# Patient Record
Sex: Male | Born: 1950
Health system: Southern US, Community
[De-identification: ages and names within clinical notes are randomized; demographics above are authoritative.]

## PROBLEM LIST (undated history)

## (undated) DIAGNOSIS — M1990 Unspecified osteoarthritis, unspecified site: Secondary | ICD-10-CM

## (undated) DIAGNOSIS — G4730 Sleep apnea, unspecified: Secondary | ICD-10-CM

## (undated) DIAGNOSIS — I255 Ischemic cardiomyopathy: Secondary | ICD-10-CM

## (undated) DIAGNOSIS — I10 Essential (primary) hypertension: Secondary | ICD-10-CM

## (undated) DIAGNOSIS — E119 Type 2 diabetes mellitus without complications: Secondary | ICD-10-CM

## (undated) DIAGNOSIS — Z87442 Personal history of urinary calculi: Secondary | ICD-10-CM

## (undated) DIAGNOSIS — I219 Acute myocardial infarction, unspecified: Secondary | ICD-10-CM

## (undated) DIAGNOSIS — I2510 Atherosclerotic heart disease of native coronary artery without angina pectoris: Secondary | ICD-10-CM

## (undated) DIAGNOSIS — J189 Pneumonia, unspecified organism: Secondary | ICD-10-CM

## (undated) DIAGNOSIS — I509 Heart failure, unspecified: Secondary | ICD-10-CM

## (undated) DIAGNOSIS — Z95810 Presence of automatic (implantable) cardiac defibrillator: Secondary | ICD-10-CM

## (undated) HISTORY — PX: OTHER SURGICAL HISTORY: SHX169

## (undated) HISTORY — PX: HERNIA REPAIR: SHX51

## (undated) HISTORY — DX: Essential (primary) hypertension: I10

## (undated) HISTORY — DX: Ischemic cardiomyopathy: I25.5

## (undated) HISTORY — DX: Type 2 diabetes mellitus without complications: E11.9

---
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
## 1974-10-18 HISTORY — PX: HERNIA REPAIR: SHX51

## 1982-10-18 HISTORY — PX: NASAL SEPTUM SURGERY: SHX37

## 1984-10-18 HISTORY — PX: VASECTOMY: SHX75

## 1985-10-18 HISTORY — PX: VASECTOMY: SHX75

## 1991-10-19 HISTORY — PX: CYSTOSCOPY W/ STONE MANIPULATION: SHX1427

## 2000-10-18 DIAGNOSIS — I10 Essential (primary) hypertension: Secondary | ICD-10-CM

## 2000-10-18 DIAGNOSIS — E119 Type 2 diabetes mellitus without complications: Secondary | ICD-10-CM

## 2000-10-18 HISTORY — DX: Essential (primary) hypertension: I10

## 2000-10-18 HISTORY — DX: Type 2 diabetes mellitus without complications: E11.9

## 2001-03-10 ENCOUNTER — Ambulatory Visit (HOSPITAL_COMMUNITY): Admission: RE | Admit: 2001-03-10 | Discharge: 2001-03-10 | Payer: Self-pay | Admitting: Gastroenterology

## 2001-09-12 ENCOUNTER — Encounter: Payer: Self-pay | Admitting: Family Medicine

## 2001-09-12 ENCOUNTER — Ambulatory Visit (HOSPITAL_COMMUNITY): Admission: RE | Admit: 2001-09-12 | Discharge: 2001-09-12 | Payer: Self-pay | Admitting: Family Medicine

## 2010-10-18 HISTORY — PX: JOINT REPLACEMENT: SHX530

## 2015-10-01 ENCOUNTER — Encounter: Payer: Self-pay | Admitting: Family Medicine

## 2015-10-01 DIAGNOSIS — I10 Essential (primary) hypertension: Secondary | ICD-10-CM | POA: Insufficient documentation

## 2015-10-01 DIAGNOSIS — E119 Type 2 diabetes mellitus without complications: Secondary | ICD-10-CM | POA: Insufficient documentation

## 2015-10-27 ENCOUNTER — Encounter: Payer: Self-pay | Admitting: Physician Assistant

## 2015-10-27 ENCOUNTER — Ambulatory Visit (INDEPENDENT_AMBULATORY_CARE_PROVIDER_SITE_OTHER): Admitting: Physician Assistant

## 2015-10-27 VITALS — BP 144/66 | HR 76 | Temp 98.1°F | Resp 18 | Ht 76.0 in | Wt 283.0 lb

## 2015-10-27 DIAGNOSIS — E291 Testicular hypofunction: Secondary | ICD-10-CM

## 2015-10-27 DIAGNOSIS — Z7189 Other specified counseling: Secondary | ICD-10-CM | POA: Diagnosis not present

## 2015-10-27 DIAGNOSIS — I10 Essential (primary) hypertension: Secondary | ICD-10-CM | POA: Diagnosis not present

## 2015-10-27 DIAGNOSIS — Z7689 Persons encountering health services in other specified circumstances: Secondary | ICD-10-CM

## 2015-10-27 DIAGNOSIS — N529 Male erectile dysfunction, unspecified: Secondary | ICD-10-CM | POA: Insufficient documentation

## 2015-10-27 DIAGNOSIS — E782 Mixed hyperlipidemia: Secondary | ICD-10-CM | POA: Insufficient documentation

## 2015-10-27 DIAGNOSIS — E119 Type 2 diabetes mellitus without complications: Secondary | ICD-10-CM | POA: Diagnosis not present

## 2015-10-27 DIAGNOSIS — E785 Hyperlipidemia, unspecified: Secondary | ICD-10-CM

## 2015-10-27 DIAGNOSIS — E349 Endocrine disorder, unspecified: Secondary | ICD-10-CM

## 2015-10-27 LAB — CBC WITH DIFFERENTIAL/PLATELET
Basophils Absolute: 0.1 10*3/uL (ref 0.0–0.1)
Basophils Relative: 1 % (ref 0–1)
Eosinophils Absolute: 0.3 10*3/uL (ref 0.0–0.7)
Eosinophils Relative: 4 % (ref 0–5)
HEMATOCRIT: 40.4 % (ref 39.0–52.0)
HEMOGLOBIN: 13.5 g/dL (ref 13.0–17.0)
LYMPHS ABS: 2.2 10*3/uL (ref 0.7–4.0)
Lymphocytes Relative: 28 % (ref 12–46)
MCH: 29.7 pg (ref 26.0–34.0)
MCHC: 33.4 g/dL (ref 30.0–36.0)
MCV: 89 fL (ref 78.0–100.0)
MONOS PCT: 8 % (ref 3–12)
MPV: 10.8 fL (ref 8.6–12.4)
Monocytes Absolute: 0.6 10*3/uL (ref 0.1–1.0)
NEUTROS ABS: 4.6 10*3/uL (ref 1.7–7.7)
NEUTROS PCT: 59 % (ref 43–77)
Platelets: 180 10*3/uL (ref 150–400)
RBC: 4.54 MIL/uL (ref 4.22–5.81)
RDW: 13.5 % (ref 11.5–15.5)
WBC: 7.8 10*3/uL (ref 4.0–10.5)

## 2015-10-27 LAB — HEMOGLOBIN A1C
Hgb A1c MFr Bld: 7.6 % — ABNORMAL HIGH (ref <5.7)
Mean Plasma Glucose: 171 mg/dL — ABNORMAL HIGH (ref <117)

## 2015-10-27 LAB — COMPLETE METABOLIC PANEL WITH GFR
ALBUMIN: 4.2 g/dL (ref 3.6–5.1)
ALT: 23 U/L (ref 9–46)
AST: 18 U/L (ref 10–35)
Alkaline Phosphatase: 55 U/L (ref 40–115)
BILIRUBIN TOTAL: 0.5 mg/dL (ref 0.2–1.2)
BUN: 20 mg/dL (ref 7–25)
CALCIUM: 9.8 mg/dL (ref 8.6–10.3)
CO2: 28 mmol/L (ref 20–31)
Chloride: 100 mmol/L (ref 98–110)
Creat: 1.09 mg/dL (ref 0.70–1.25)
GFR, EST NON AFRICAN AMERICAN: 71 mL/min (ref >=60)
GFR, Est African American: 82 mL/min (ref >=60)
GLUCOSE: 331 mg/dL — AB (ref 70–99)
Potassium: 4.6 mmol/L (ref 3.5–5.3)
SODIUM: 136 mmol/L (ref 135–146)
Total Protein: 6.7 g/dL (ref 6.1–8.1)

## 2015-10-27 MED ORDER — LISINOPRIL 20 MG PO TABS: 10.0000 mg | ORAL_TABLET | Freq: Every day | ORAL | Status: DC

## 2015-10-27 MED ORDER — METFORMIN HCL 1000 MG PO TABS: 1000.0000 mg | ORAL_TABLET | Freq: Two times a day (BID) | ORAL | Status: DC

## 2015-10-27 MED ORDER — ATORVASTATIN CALCIUM 80 MG PO TABS: 40.0000 mg | ORAL_TABLET | Freq: Every day | ORAL | Status: DC

## 2015-10-27 MED ORDER — GLYBURIDE 5 MG PO TABS: 10.0000 mg | ORAL_TABLET | Freq: Two times a day (BID) | ORAL | Status: DC

## 2015-10-27 MED ORDER — PIOGLITAZONE HCL 45 MG PO TABS
45.0000 mg | ORAL_TABLET | Freq: Every day | ORAL | Status: DC
Start: 2015-10-27 — End: 2015-11-24

## 2015-10-27 NOTE — Progress Notes (Signed)
Patient ID: Keith Schwartz MRN: [id number], DOB: 09-23-1951, 65 y.o. Date of Encounter: @DATE @  Chief Complaint:  Chief Complaint  Patient presents with  . new pt est care    HPI: 65 y.o. year old white male  Presents as a new patient to establish care.  His mother recently became a patient here and I have seen him here with her for her visits. Also his wife is being seen as a new patient to establish care here today.  Says that they had lived in this area about 10 years ago and that his wife is from this area. However they had then moved to [place] and just recently moved back to this area in September.  He says that he usually gets his medical care and medications through the [place]. Says that [facility] yet.  Says that once he gets established with the [place], he will see them for his chronic medical problems and come here just for acute illnesses.  He has no complaints or concerns today.  Says that he was diagnosed with diabetes and started medicines for diabetes at age 65.  Says that he does not need a refill on either Levitra or Viagra. Says that he has lots of them at home that have been un- used.  Says that he also been diagnosed with low testosterone and the [facility] was [facility]. His last injection was around November. He is not fasting today.   Past Medical History  Diagnosis Date  . Diabetes mellitus without complication (HCC) 2002  . Hypertension 2002     Home Meds: Outpatient Prescriptions Prior to Visit  Medication Sig Dispense Refill  . aspirin 325 MG tablet Take 325 mg by mouth daily.    . Multiple Vitamin (MULTIVITAMIN) tablet Take 1 tablet by mouth daily. MEN'S ONE A DAY 50+    . vitamin E 400 UNIT capsule Take 400 Units by mouth daily.    Marland Kitchen atorvastatin (LIPITOR) 80 MG tablet Take 40 mg by mouth daily.    Marland Kitchen glyBURIDE (DIABETA) 5 MG tablet Take 10  mg by mouth 2 (two) times daily with a meal.    . lisinopril (PRINIVIL,ZESTRIL) 20 MG tablet Take 10 mg by mouth daily.    . metFORMIN (GLUCOPHAGE) 1000 MG tablet Take 1,000 mg by mouth 2 (two) times daily with a meal.    . pioglitazone (ACTOS) 45 MG tablet Take 45 mg by mouth daily.    . hydrOXYzine (ATARAX/VISTARIL) 25 MG tablet Take 25 mg by mouth 3 (three) times daily.    . sildenafil (VIAGRA) 100 MG tablet Take 50 mg by mouth daily as needed for erectile dysfunction. Reported on 10/27/2015    . vardenafil (LEVITRA) 20 MG tablet Take 20 mg by mouth. Reported on 10/27/2015     No facility-administered medications prior to visit.    Allergies:  Allergies  Allergen Reactions  . Penicillins     Social History   Social History  . Marital Status: Married    Spouse Name: N/A  . Number of Children: N/A  . Years of Education: N/A   Occupational History  . Not on file.   Social History Main Topics  . Smoking status: Never Smoker   . Smokeless tobacco: Never Used  . Alcohol Use: 0.6 oz/week    1 Cans of beer per week  . Drug Use:  Not on file  . Sexual Activity: Yes    Birth Control/ Protection: Surgical   Other Topics Concern  . Not on file   Social History Narrative    Family History  Problem Relation Age of Onset  . Arthritis Mother   . Asthma Mother   . Hearing loss Mother   . Vision loss Mother   . Heart disease Father   . Hyperlipidemia Father   . Hypertension Father   . Early death Sister      Review of Systems:  See HPI for pertinent ROS. All other ROS negative.    Physical Exam: Blood pressure 144/66, pulse 76, temperature 98.1 F (36.7 C), temperature source Oral, resp. rate 18, height 6\' 4"  (1.93 m), weight 283 lb (128.368 kg)., Body mass index is 34.46 kg/(m^2). General: WNWD WM. Appears in no acute distress. Neck: Supple. No thyromegaly. No lymphadenopathy. No carotid bruits. Lungs: Clear bilaterally to auscultation without wheezes, rales, or rhonchi.  Breathing is unlabored. Heart: RRR with S1 S2. No murmurs, rubs, or gallops. Abdomen: Soft, non-tender, non-distended with normoactive bowel sounds. No hepatomegaly. No rebound/guarding. No obvious abdominal masses. Musculoskeletal:  Strength and tone normal for age. Extremities/Skin: Warm and dry. He has on boots and does not want to remove them. Says that he can see his feet and nose that there are no wounds and says he has good sensation of his feet. Neuro: Alert and oriented X 3. Moves all extremities spontaneously. Gait is normal. CNII-XII grossly in tact. Psych:  Responds to questions appropriately with a normal affect.     ASSESSMENT AND PLAN:  65 y.o. year old male with  1. Encounter to establish care with new doctor  2. Diabetes mellitus without complication (HCC) Will continue current medications. Check labs to monitor. Says that usually he sees an eye doctor through the [place]. Says that he is in the process of getting established [facility] and then will schedule to see an eye doctor with them. - COMPLETE METABOLIC PANEL WITH GFR - Hemoglobin A1c - glyBURIDE (DIABETA) 5 MG tablet; Take 2 tablets (10 mg total) by mouth 2 (two) times daily with a meal.  Dispense: 360 tablet; Refill: 0 - metFORMIN (GLUCOPHAGE) 1000 MG tablet; Take 1 tablet (1,000 mg total) by mouth 2 (two) times daily with a meal.  Dispense: 180 tablet; Refill: 0 - pioglitazone (ACTOS) 45 MG tablet; Take 1 tablet (45 mg total) by mouth daily.  Dispense: 90 tablet; Refill: 0  3. Hyperlipemia Refill medication. Check lab to monitor. He is not fasting so cannot check lipid panel. - COMPLETE METABOLIC PANEL WITH GFR - atorvastatin (LIPITOR) 80 MG tablet; Take 0.5 tablets (40 mg total) by mouth daily.  Dispense: 90 tablet; Refill: 0  4. Essential hypertension He says that his blood pressure is usually much lower usually in the 120s. Continue current medication. Check lab to monitor. - COMPLETE METABOLIC PANEL  WITH GFR - lisinopril (PRINIVIL,ZESTRIL) 20 MG tablet; Take 0.5 tablets (10 mg total) by mouth daily.  Dispense: 90 tablet; Refill: 0  5. Erectile dysfunction, unspecified erectile dysfunction type Says that he does not need refill on medication at this time.  6. Testosterone deficiency Says that the [place] had diagnosed him with low testosterone and had him on testosterone injections. Is that last injection was in November. We'll check testosterone level and other labs now. If indicated once we get lab results, then we'll have him return for testosterone injection. - Testosterone - PSA -  CBC with Differential/Platelet   Signed, Shon HaleMary Beth Plumas [place], [place], [facility] 10/27/2015 11:35 AM

## 2015-10-28 LAB — TESTOSTERONE: Testosterone: 258 ng/dL (ref 250–827)

## 2015-10-28 LAB — PSA: PSA: 0.56 ng/mL (ref <=4.00)

## 2015-10-29 ENCOUNTER — Other Ambulatory Visit: Payer: Self-pay | Admitting: Family Medicine

## 2015-10-29 DIAGNOSIS — E1165 Type 2 diabetes mellitus with hyperglycemia: Principal | ICD-10-CM

## 2015-10-29 DIAGNOSIS — IMO0001 Reserved for inherently not codable concepts without codable children: Secondary | ICD-10-CM

## 2015-10-29 MED ORDER — SITAGLIPTIN PHOSPHATE 100 MG PO TABS: 100.0000 mg | ORAL_TABLET | Freq: Every day | ORAL | Status: DC

## 2015-11-03 ENCOUNTER — Encounter: Payer: Self-pay | Admitting: Family Medicine

## 2015-11-24 ENCOUNTER — Telehealth: Payer: Self-pay | Admitting: Family Medicine

## 2015-11-24 ENCOUNTER — Other Ambulatory Visit: Payer: Self-pay | Admitting: Family Medicine

## 2015-11-24 DIAGNOSIS — E785 Hyperlipidemia, unspecified: Secondary | ICD-10-CM

## 2015-11-24 DIAGNOSIS — E119 Type 2 diabetes mellitus without complications: Secondary | ICD-10-CM

## 2015-11-24 DIAGNOSIS — IMO0001 Reserved for inherently not codable concepts without codable children: Secondary | ICD-10-CM

## 2015-11-24 DIAGNOSIS — I10 Essential (primary) hypertension: Secondary | ICD-10-CM

## 2015-11-24 DIAGNOSIS — E1165 Type 2 diabetes mellitus with hyperglycemia: Secondary | ICD-10-CM

## 2015-11-24 MED ORDER — METFORMIN HCL 1000 MG PO TABS: 1000.0000 mg | ORAL_TABLET | Freq: Two times a day (BID) | ORAL | Status: DC

## 2015-11-24 MED ORDER — GLYBURIDE 5 MG PO TABS: 10.0000 mg | ORAL_TABLET | Freq: Two times a day (BID) | ORAL | Status: DC

## 2015-11-24 MED ORDER — HYDROXYZINE HCL 25 MG PO TABS: 25.0000 mg | ORAL_TABLET | Freq: Three times a day (TID) | ORAL | Status: DC | PRN

## 2015-11-24 MED ORDER — ATORVASTATIN CALCIUM 80 MG PO TABS: 40.0000 mg | ORAL_TABLET | Freq: Every day | ORAL | Status: DC

## 2015-11-24 MED ORDER — PIOGLITAZONE HCL 45 MG PO TABS: 45.0000 mg | ORAL_TABLET | Freq: Every day | ORAL | Status: DC

## 2015-11-24 MED ORDER — LISINOPRIL 20 MG PO TABS: 10.0000 mg | ORAL_TABLET | Freq: Every day | ORAL | Status: DC

## 2015-11-24 MED ORDER — SITAGLIPTIN PHOSPHATE 100 MG PO TABS: 100.0000 mg | ORAL_TABLET | Freq: Every day | ORAL | Status: DC

## 2015-11-24 NOTE — Telephone Encounter (Signed)
Rec'd letter form [facility] concerning step therapy for pt's Actos.  Wants other alternatives to be considered (metformin, glimepiride, glipizide, glyburide, glyburide micronized).  Pt is already taking Glyburide, Metformnin and Jnauvia.  Letter and pt's current med list faxed back to [facility] for their review.

## 2015-11-24 NOTE — Telephone Encounter (Signed)
Medication refilled per protocol. 

## 2015-11-24 NOTE — Telephone Encounter (Signed)
Approved.  

## 2015-11-24 NOTE — Telephone Encounter (Signed)
Has 1 mth visit this week, 3 mth visit in April.  OK for Hydroxyzine (prn) to mail order?

## 2015-11-27 ENCOUNTER — Ambulatory Visit: Payer: Self-pay | Admitting: Physician Assistant

## 2016-01-26 ENCOUNTER — Ambulatory Visit (INDEPENDENT_AMBULATORY_CARE_PROVIDER_SITE_OTHER): Payer: Medicare Other | Admitting: Physician Assistant

## 2016-01-26 ENCOUNTER — Encounter: Payer: Self-pay | Admitting: Physician Assistant

## 2016-01-26 VITALS — BP 126/64 | HR 68 | Temp 98.0°F | Resp 18 | Wt 278.0 lb

## 2016-01-26 DIAGNOSIS — N529 Male erectile dysfunction, unspecified: Secondary | ICD-10-CM

## 2016-01-26 DIAGNOSIS — E119 Type 2 diabetes mellitus without complications: Secondary | ICD-10-CM

## 2016-01-26 DIAGNOSIS — E291 Testicular hypofunction: Secondary | ICD-10-CM | POA: Diagnosis not present

## 2016-01-26 DIAGNOSIS — Z1159 Encounter for screening for other viral diseases: Secondary | ICD-10-CM

## 2016-01-26 DIAGNOSIS — I10 Essential (primary) hypertension: Secondary | ICD-10-CM | POA: Diagnosis not present

## 2016-01-26 DIAGNOSIS — E349 Endocrine disorder, unspecified: Secondary | ICD-10-CM

## 2016-01-26 DIAGNOSIS — E785 Hyperlipidemia, unspecified: Secondary | ICD-10-CM

## 2016-01-26 NOTE — Progress Notes (Signed)
Patient ID: [name] MRN: [id number], DOB: [birth year], 65 y.o. Date of Encounter: @DATE @  Chief Complaint:  Chief Complaint  Patient presents with  . routine follow up    question about Hep C and Zosatvax, said Januvia caused shortness of breath    HPI: 65 y.o. year old white male  Presents for routine f/u OV.  10/27/2015 OV: Presents as a new patient to establish care.  His mother recently became a patient here and I have seen him here with her for her visits. Also his wife is being seen as a new patient to establish care here today.  Says that they had lived in this area about 10 years ago and that his wife is from this area. However [place] and just recently moved back to this area in September.  He says that he [facility]. Says that he is in the process of trying to get established with the [facility] but has not heard back from them yet.  Says that once he gets [facility], he will see them for his chronic medical problems and come here just for acute illnesses.  He has no complaints or concerns today.  Says that he was diagnosed with diabetes and started medicines for diabetes at age 65.  Says that he does not need a refill on either Levitra or Viagra. Says that he has lots of them at home that have been un- used.  Says that he also been diagnosed with low testosterone and the [facility] was giving him testosterone [facility]. His last injection was around November. He is not fasting today.  01/26/2016: He is requesting a Hepatitis C screening. Today he reports that the Januvia "made it hard to breathe --and that's one of the side effects of it ". Says that he quit the Januvia. States that his breathing has been back to normal baseline and he is having no increased shortness of breath since.  He states that [facility] [place] Says he went there about 3 or 4 weeks  ago. They did a diabetic eye exam. Says that they will manage his diabetes. Says A1c was done there was 7.1.   Past Medical History  Diagnosis Date  . Diabetes mellitus without complication (HCC) 2002  . Hypertension 2002     Home Meds: Outpatient Prescriptions Prior to Visit  Medication Sig Dispense Refill  . aspirin 325 MG tablet Take 325 mg by mouth daily.    Marland Kitchen. atorvastatin (LIPITOR) 80 MG tablet Take 0.5 tablets (40 mg total) by mouth daily. 90 tablet 1  . glyBURIDE (DIABETA) 5 MG tablet Take 2 tablets (10 mg total) by mouth 2 (two) times daily with a meal. 360 tablet 1  . hydrOXYzine (ATARAX/VISTARIL) 25 MG tablet Take 1 tablet (25 mg total) by mouth 3 (three) times daily as needed. 90 tablet 1  . lisinopril (PRINIVIL,ZESTRIL) 20 MG tablet Take 0.5 tablets (10 mg total) by mouth daily. 90 tablet 1  . metFORMIN (GLUCOPHAGE) 1000 MG tablet Take 1 tablet (1,000 mg total) by mouth 2 (two) times daily with a meal. 180 tablet 1  . Multiple Vitamin (MULTIVITAMIN) tablet Take 1 tablet by mouth daily. MEN'S ONE A DAY 50+    . pioglitazone (ACTOS) 45 MG tablet Take 1 tablet (45 mg total) by mouth daily. 90 tablet 1  . vitamin E 400 UNIT capsule Take 400  Units by mouth daily.    . sildenafil (VIAGRA) 100 MG tablet Take 50 mg by mouth daily as needed for erectile dysfunction. Reported on 10/27/2015    . sitaGLIPtin (JANUVIA) 100 MG tablet Take 1 tablet (100 mg total) by mouth daily. (Patient not taking: Reported on 01/26/2016) 90 tablet 1  . vardenafil (LEVITRA) 20 MG tablet Take 20 mg by mouth. Reported on 10/27/2015     No facility-administered medications prior to visit.    Allergies:  Allergies  Allergen Reactions  . Penicillins     Social History   Social History  . Marital Status: Married    Spouse Name: N/A  . Number of Children: N/A  . Years of Education: N/A   Occupational History  . Not on file.   Social History Main Topics  . Smoking status: Never Smoker   . Smokeless  tobacco: Never Used  . Alcohol Use: 0.6 oz/week    1 Cans of beer per week  . Drug Use: Not on file  . Sexual Activity: Yes    Birth Control/ Protection: Surgical   Other Topics Concern  . Not on file   Social History Narrative    Family History  Problem Relation Age of Onset  . Arthritis Mother   . Asthma Mother   . Hearing loss Mother   . Vision loss Mother   . Heart disease Father   . Hyperlipidemia Father   . Hypertension Father   . Early death Sister      Review of Systems:  See HPI for pertinent ROS. All other ROS negative.    Physical Exam: Blood pressure 126/64, pulse 68, temperature 98 F (36.7 C), temperature source Oral, resp. rate 18, weight 278 lb (126.1 kg)., Body mass index is 33.85 kg/(m^2). General: WNWD WM. Appears in no acute distress. Neck: Supple. No thyromegaly. No lymphadenopathy. No carotid bruits. Lungs: Clear bilaterally to auscultation without wheezes, rales, or rhonchi. Breathing is unlabored. Heart: RRR with S1 S2. No murmurs, rubs, or gallops. Abdomen: Soft, non-tender, non-distended with normoactive bowel sounds. No hepatomegaly. No rebound/guarding. No obvious abdominal masses. Musculoskeletal:  Strength and tone normal for age. Extremities/Skin: Warm and dry. He has on boots and does not want to remove them. Says that he can see his feet and nose that there are no wounds and says he has good sensation of his feet. Neuro: Alert and oriented X 3. Moves all extremities spontaneously. Gait is normal. CNII-XII grossly in tact. Psych:  Responds to questions appropriately with a normal affect.     ASSESSMENT AND PLAN:  64 y.o. year old male with   1. Diabetes mellitus without complication (HCC) This is now being managed by the [place].  2. Essential hypertension This is now being managed by the [place].  3. Hyperlipemia This is now being managed by the [place].   4. Erectile dysfunction, unspecified erectile dysfunction type This is now being  managed by the [place].   5. Testosterone deficiency This is now being managed by the [place].   6. Need for hepatitis C screening test - Hepatitis C Antibody  Check lab for hepatitis C. He is to call his insurance and find out about coverage of the Zostavax and then call and let us know whether he wants to get this or not. [facility] with the [facility] for his [facility] of his chronic medical problems and will follow-up with [place] [facility] for any acute illnesses.   [address] [place], [place], [facility] 01/26/2016 12:25 PM

## 2016-01-27 LAB — HEPATITIS C ANTIBODY: HCV Ab: NEGATIVE

## 2016-04-17 ENCOUNTER — Other Ambulatory Visit: Payer: Self-pay | Admitting: Family Medicine

## 2016-06-05 ENCOUNTER — Other Ambulatory Visit: Payer: Self-pay | Admitting: Family Medicine

## 2016-06-05 DIAGNOSIS — E119 Type 2 diabetes mellitus without complications: Secondary | ICD-10-CM

## 2016-06-07 ENCOUNTER — Encounter: Payer: Self-pay | Admitting: Physician Assistant

## 2016-06-07 ENCOUNTER — Ambulatory Visit (INDEPENDENT_AMBULATORY_CARE_PROVIDER_SITE_OTHER): Payer: Medicare Other | Admitting: Physician Assistant

## 2016-06-07 VITALS — BP 138/80 | HR 78 | Temp 98.6°F | Resp 16 | Ht 76.0 in | Wt 279.0 lb

## 2016-06-07 DIAGNOSIS — I10 Essential (primary) hypertension: Secondary | ICD-10-CM

## 2016-06-07 DIAGNOSIS — Z23 Encounter for immunization: Secondary | ICD-10-CM | POA: Diagnosis not present

## 2016-06-07 DIAGNOSIS — E119 Type 2 diabetes mellitus without complications: Secondary | ICD-10-CM

## 2016-06-07 DIAGNOSIS — E785 Hyperlipidemia, unspecified: Secondary | ICD-10-CM

## 2016-06-07 DIAGNOSIS — E291 Testicular hypofunction: Secondary | ICD-10-CM | POA: Diagnosis not present

## 2016-06-07 DIAGNOSIS — E349 Endocrine disorder, unspecified: Secondary | ICD-10-CM

## 2016-06-07 DIAGNOSIS — N529 Male erectile dysfunction, unspecified: Secondary | ICD-10-CM | POA: Diagnosis not present

## 2016-06-07 NOTE — Progress Notes (Signed)
Patient ID: Keith Schwartz MRN: [id number], DOB: [birth year], 65 y.o. Date of Encounter: @DATE @  Chief Complaint:  Chief Complaint  Patient presents with  . Medication Management    pt wife wants him to get shingles vaccine and talk about ED    HPI: 65 y.o. year old white male  Presents for routine f/u OV.  10/27/2015 OV: Presents as a new patient to establish care.  His mother recently became a patient here and I have seen him here with her for her visits. Also his wife is being seen as a new patient to establish care here today.  Says that they had lived in this area about 10 years ago and that his wife is from this area. However they had then moved to [place] and just recently moved back to this area in September.  He says that he usually gets his medical care and medications through the [place]. Says that he is in the process of trying to get established with the [facility] here but has not heard back from them yet.  Says that once he gets established with the [place], he will see them for his chronic medical problems and come here just for acute illnesses.  He has no complaints or concerns today.  Says that he was diagnosed with diabetes and started medicines for diabetes at age 65.  Says that he does not need a refill on either Levitra or Viagra. Says that he has lots of them at home that have been un- used.  Says that he also been diagnosed with low testosterone and the [facility] was giving him testosterone [facility]. His last injection was around November. He is not fasting today.  01/26/2016: He is requesting a Hepatitis C screening. Today he reports that the Januvia "made it hard to breathe --and that's one of the side effects of it ". Says that he quit the Januvia. States that his breathing has been back to normal baseline and he is having no increased shortness of breath since.  He states that he has established with the [place] in [place]. Says he went there about 3 or 4 weeks ago. They  did a diabetic eye exam. Says that they will manage his diabetes. Says A1c was done there was 7.1.  06/07/2016: Says that he got spoiled by his prior [place]. They would call him and remind him of when to come in for visits. Says that he hadn't gotten a phone call so he hasn't gone to the [place] recently. Encouraged him to call them to schedule follow-up there but I will go ahead and check labs to monitor things today. Says last office visit at the [place] was about 4 or 5 months ago. His wife checked and the Zostavax is covered with her insurance so he wants to go ahead and get that vaccine today. No other complaints or concerns today.  Past Medical History:  Diagnosis Date  . Diabetes mellitus without complication (HCC) 2002  . Hypertension 2002     Home Meds: Outpatient Medications Prior to Visit  Medication Sig Dispense Refill  . aspirin 325 MG tablet Take 325 mg by mouth daily.    Marland Kitchen atorvastatin (LIPITOR) 80 MG tablet Take 0.5 tablets (40 mg total) by mouth daily. 90 tablet 1  . glyBURIDE (DIABETA) 5 MG tablet TAKE 2 TABLETS TWICE A DAY WITH MEALS 360 tablet 1  . hydrOXYzine (ATARAX/VISTARIL) 25 MG tablet Take 1 tablet (25 mg total) by mouth 3 (three) times daily as  needed. 90 tablet 1  . lisinopril (PRINIVIL,ZESTRIL) 20 MG tablet Take 0.5 tablets (10 mg total) by mouth daily. 90 tablet 1  . metFORMIN (GLUCOPHAGE) 1000 MG tablet TAKE 1 TABLET TWICE A DAY WITH MEALS 180 tablet 1  . Multiple Vitamin (MULTIVITAMIN) tablet Take 1 tablet by mouth daily. MEN'S ONE A DAY 50+    . pioglitazone (ACTOS) 45 MG tablet TAKE 1 TABLET DAILY 90 tablet 1  . vardenafil (LEVITRA) 20 MG tablet Take 20 mg by mouth. Reported on 10/27/2015    . vitamin E 400 UNIT capsule Take 400 Units by mouth daily.    . sildenafil (VIAGRA) 100 MG tablet Take 50 mg by mouth daily as needed for erectile dysfunction. Reported on 10/27/2015     No facility-administered medications prior to visit.     Allergies:  Allergies  Allergen  Reactions  . Penicillins     Social History   Social History  . Marital status: Married    Spouse name: N/A  . Number of children: N/A  . Years of education: N/A   Occupational History  . Not on file.   Social History Main Topics  . Smoking status: Never Smoker  . Smokeless tobacco: Never Used  . Alcohol use 0.6 oz/week    1 Cans of beer per week  . Drug use: Unknown  . Sexual activity: Yes    Birth control/ protection: Surgical   Other Topics Concern  . Not on file   Social History Narrative  . No narrative on file    Family History  Problem Relation Age of Onset  . Arthritis Mother   . Asthma Mother   . Hearing loss Mother   . Vision loss Mother   . Heart disease Father   . Hyperlipidemia Father   . Hypertension Father   . Early death Sister      Review of Systems:  See HPI for pertinent ROS. All other ROS negative.    Physical Exam: Blood pressure 138/80, pulse 78, temperature 98.6 F (37 C), temperature source Oral, resp. rate 16, height 6\' 4"  (1.93 m), weight 279 lb (126.6 kg)., Body mass index is 33.96 kg/m. General: WNWD WM. Appears in no acute distress. Neck: Supple. No thyromegaly. No lymphadenopathy. No carotid bruits. Lungs: Clear bilaterally to auscultation without wheezes, rales, or rhonchi. Breathing is unlabored. Heart: RRR with S1 S2. No murmurs, rubs, or gallops. Abdomen: Soft, non-tender, non-distended with normoactive bowel sounds. No hepatomegaly. No rebound/guarding. No obvious abdominal masses. Musculoskeletal:  Strength and tone normal for age. Extremities/Skin: Warm and dry. He has on boots and does not want to remove them. Says that he can see his feet and nose that there are no wounds and says he has good sensation of his feet. Neuro: Alert and oriented X 3. Moves all extremities spontaneously. Gait is normal. CNII-XII grossly in tact. Psych:  Responds to questions appropriately with a normal affect.     ASSESSMENT AND PLAN:  65  y.o. year old male with   1. Diabetes mellitus without complication (HCC) This is now being managed by [facility].  2. Essential hypertension This is now being managed by the [facility].  3. Hyperlipemia This is now being managed by the [facility].   4. Erectile dysfunction, unspecified erectile dysfunction type This is now being managed by the [facility].   5. Testosterone deficiency This is now being managed by the [facility].   6. Need for hepatitis C screening test - Hepatitis C Screen was  obtained 01/2016---negative  Give Zostavax here today---06/07/2016  [facility] with the [facility] for his management of his chronic medical problems and will follow-up with us PRN for any acute illnesses.   [address], [place] [address] [place], [place], [facility] 06/07/2016 3:54 PM

## 2016-06-07 NOTE — Telephone Encounter (Signed)
Medication refilled per protocol. 

## 2016-06-08 LAB — COMPLETE METABOLIC PANEL WITH GFR
ALK PHOS: 60 U/L (ref 40–115)
ALT: 26 U/L (ref 9–46)
AST: 17 U/L (ref 10–35)
Albumin: 3.9 g/dL (ref 3.6–5.1)
BILIRUBIN TOTAL: 0.4 mg/dL (ref 0.2–1.2)
BUN: 22 mg/dL (ref 7–25)
CALCIUM: 9.5 mg/dL (ref 8.6–10.3)
CO2: 26 mmol/L (ref 20–31)
Chloride: 102 mmol/L (ref 98–110)
Creat: 1.05 mg/dL (ref 0.70–1.25)
GFR, EST AFRICAN AMERICAN: 86 mL/min (ref >=60)
GFR, EST NON AFRICAN AMERICAN: 74 mL/min (ref >=60)
Glucose, Bld: 176 mg/dL — ABNORMAL HIGH (ref 70–99)
Potassium: 4.4 mmol/L (ref 3.5–5.3)
SODIUM: 144 mmol/L (ref 135–146)
Total Protein: 6.4 g/dL (ref 6.1–8.1)

## 2016-06-08 LAB — HEMOGLOBIN A1C
Hgb A1c MFr Bld: 6.8 % — ABNORMAL HIGH (ref <5.7)
MEAN PLASMA GLUCOSE: 148 mg/dL

## 2016-06-18 ENCOUNTER — Telehealth: Payer: Self-pay | Admitting: *Deleted

## 2016-06-18 MED ORDER — SILDENAFIL CITRATE 100 MG PO TABS: 50.0000 mg | ORAL_TABLET | Freq: Every day | ORAL | 11 refills | Status: DC | PRN

## 2016-06-18 NOTE — Telephone Encounter (Signed)
Patient wife in office.   Requesting refill on Viagra.   Prescription sent to pharmacy.

## 2016-09-15 DIAGNOSIS — S82001A Unspecified fracture of right patella, initial encounter for closed fracture: Secondary | ICD-10-CM | POA: Diagnosis not present

## 2016-09-15 DIAGNOSIS — S72414A Nondisplaced unspecified condyle fracture of lower end of right femur, initial encounter for closed fracture: Secondary | ICD-10-CM | POA: Diagnosis not present

## 2016-09-24 DIAGNOSIS — S72434S Nondisplaced fracture of medial condyle of right femur, sequela: Secondary | ICD-10-CM | POA: Diagnosis not present

## 2016-10-05 DIAGNOSIS — S72414D Nondisplaced unspecified condyle fracture of lower end of right femur, subsequent encounter for closed fracture with routine healing: Secondary | ICD-10-CM | POA: Diagnosis not present

## 2016-10-14 ENCOUNTER — Other Ambulatory Visit: Payer: Self-pay | Admitting: Family Medicine

## 2016-10-14 DIAGNOSIS — I10 Essential (primary) hypertension: Secondary | ICD-10-CM

## 2016-11-02 DIAGNOSIS — S72434D Nondisplaced fracture of medial condyle of right femur, subsequent encounter for closed fracture with routine healing: Secondary | ICD-10-CM | POA: Diagnosis not present

## 2016-12-02 ENCOUNTER — Other Ambulatory Visit: Payer: Self-pay | Admitting: Family Medicine

## 2016-12-02 DIAGNOSIS — E785 Hyperlipidemia, unspecified: Secondary | ICD-10-CM

## 2016-12-04 ENCOUNTER — Other Ambulatory Visit: Payer: Self-pay | Admitting: Physician Assistant

## 2016-12-04 DIAGNOSIS — E119 Type 2 diabetes mellitus without complications: Secondary | ICD-10-CM

## 2016-12-06 NOTE — Telephone Encounter (Signed)
Rx filled per protocol. Pt due for OV letter mailed

## 2017-02-03 ENCOUNTER — Encounter: Payer: Self-pay | Admitting: Physician Assistant

## 2017-02-03 ENCOUNTER — Ambulatory Visit (INDEPENDENT_AMBULATORY_CARE_PROVIDER_SITE_OTHER): Payer: Medicare Other | Admitting: Physician Assistant

## 2017-02-03 VITALS — BP 130/70 | HR 78 | Temp 97.9°F | Resp 18 | Wt 286.6 lb

## 2017-02-03 DIAGNOSIS — E119 Type 2 diabetes mellitus without complications: Secondary | ICD-10-CM | POA: Diagnosis not present

## 2017-02-03 DIAGNOSIS — E785 Hyperlipidemia, unspecified: Secondary | ICD-10-CM

## 2017-02-03 DIAGNOSIS — N529 Male erectile dysfunction, unspecified: Secondary | ICD-10-CM | POA: Diagnosis not present

## 2017-02-03 DIAGNOSIS — I10 Essential (primary) hypertension: Secondary | ICD-10-CM

## 2017-02-03 DIAGNOSIS — W57XXXA Bitten or stung by nonvenomous insect and other nonvenomous arthropods, initial encounter: Secondary | ICD-10-CM

## 2017-02-03 LAB — COMPLETE METABOLIC PANEL WITH GFR
ALK PHOS: 53 U/L (ref 40–115)
ALT: 26 U/L (ref 9–46)
AST: 21 U/L (ref 10–35)
Albumin: 4.1 g/dL (ref 3.6–5.1)
BILIRUBIN TOTAL: 0.5 mg/dL (ref 0.2–1.2)
BUN: 33 mg/dL — ABNORMAL HIGH (ref 7–25)
CALCIUM: 9.7 mg/dL (ref 8.6–10.3)
CO2: 24 mmol/L (ref 20–31)
CREATININE: 1.37 mg/dL — AB (ref 0.70–1.25)
Chloride: 105 mmol/L (ref 98–110)
GFR, EST AFRICAN AMERICAN: 62 mL/min (ref >=60)
GFR, Est Non African American: 53 mL/min — ABNORMAL LOW (ref >=60)
Glucose, Bld: 274 mg/dL — ABNORMAL HIGH (ref 70–99)
Potassium: 5.2 mmol/L (ref 3.5–5.3)
Sodium: 138 mmol/L (ref 135–146)
TOTAL PROTEIN: 6.5 g/dL (ref 6.1–8.1)

## 2017-02-03 MED ORDER — DOXYCYCLINE HYCLATE 100 MG PO TABS: 100.0000 mg | ORAL_TABLET | Freq: Two times a day (BID) | ORAL | 0 refills | Status: DC

## 2017-02-03 MED ORDER — DICLOFENAC SODIUM 50 MG PO TBEC: 50.0000 mg | DELAYED_RELEASE_TABLET | Freq: Three times a day (TID) | ORAL | 0 refills | Status: DC

## 2017-02-03 NOTE — Progress Notes (Signed)
Patient ID: Keith Schwartz MRN: [id number], DOB: [birth year], 66 y.o. Date of Encounter: @  Chief Complaint:  Chief Complaint  Patient presents with  . Tick Removal    neck, on back,ear lobe    HPI: 66 y.o. year old white male  Presents for routine f/u OV.  10/27/2015 OV: Presents as a new patient to establish care.  His mother recently became a patient here and I have seen him here with her for her visits. Also his wife is being seen as a new patient to establish care here today.  Says that they had lived in this area about 10 years ago and that his wife is from this area. However they had then moved to [place] and just recently moved back to this area in September.  He says that he usually gets his medical care and medications through the [place]. Says that he is in the [facility] but has not [facility] from them yet.  Says that once he gets established with the [place], he will see them for his chronic medical problems and come here just for acute illnesses.  He has no complaints or concerns today.  Says that he was diagnosed with diabetes and started medicines for diabetes at age 60.  Says that he does not need a refill on either Levitra or Viagra. Says that he has lots of them at home that have been un- used.  Says that he also been diagnosed with low testosterone and the [facility] was giving him testosterone [facility]. His last injection was around November. He is not fasting today.  01/26/2016: He is requesting a Hepatitis C screening. Today he reports that the Januvia "made it hard to breathe --and that's one of the side effects of it ". Says that he quit the Januvia. States that his breathing has been back to normal baseline and he is having no increased shortness of breath since.  He states that he has established with the [place] in [place]. Says he went there about 3 or 4 weeks ago. They did a diabetic eye exam. Says that they will  manage his diabetes. Says A1c was done there was 7.1.  06/07/2016: Says that he got spoiled by his prior [facility]. They would call him and remind him of when to come in for visits. Says that he hadn't gotten a phone call so he hasn't gone to the [place] recently. Encouraged him to call them to schedule follow-up there but I will go ahead and check labs to monitor things today. Says last office visit at the [place] was about 4 or 5 months ago. His wife checked and the Zostavax is covered with her insurance so he wants to go ahead and get that vaccine today. No other complaints or concerns today.  02/02/2017: He states that he has pulled off 3 tics over the last 1-1/2 weeks. Shows me those sites. No significant rash at any of those sites. Says that he has been having headache and he "doesn't usually have a headache" Says that he also has been feeling more fatigued and just feels bad. Wife has had history of RMSF  and concerned that having same type of symptoms. He has seen no rash.  He is also here for routine follow-up office visit of his hypertension hyperlipidemia and diabetes. He is not fasting. I reviewed that I have no lipid panel in epic whatsoever and was having discussion of him returning fasting.  Then says that he just had lipid panel at the [place] in either December or January. I then asked whether the [facility] is managing these problems or whether I am. He says that he wants me to manage these problems but does want to just go ahead and check the other blood work and skip the lipid panel since he knows the [place] has done this recently. Says that the [facility] did add diclofenac 50 mg 1 by mouth 3 times a day to use for knee pain. Says that he knows that his feet are good. Says that his last eye exam was 1-1/2 years ago but the [place] is setting him up for an eye exam. He is taking blood pressure medications as directed. No lightheadedness. No lower extremity edema. Taking his diabetic medications as directed. No adverse effects. He is  taking his statin as directed. In general has no myalgias with this. Just over the last week has felt a little more achy than usual and thinks this is secondary to the tick bites.      Past Medical History:  Diagnosis Date  . Diabetes mellitus without complication (HCC) 2002  . Hypertension 2002     Home Meds: Outpatient Medications Prior to Visit  Medication Sig Dispense Refill  . aspirin 325 MG tablet Take 325 mg by mouth daily.    Marland Kitchen atorvastatin (LIPITOR) 80 MG tablet TAKE ONE-HALF (1/2) TABLET DAILY 90 tablet 1  . glyBURIDE (DIABETA) 5 MG tablet TAKE 2 TABLETS TWICE A DAY WITH MEALS 120 tablet 0  . hydrOXYzine (ATARAX/VISTARIL) 25 MG tablet Take 1 tablet (25 mg total) by mouth 3 (three) times daily as needed. 90 tablet 1  . lisinopril (PRINIVIL,ZESTRIL) 20 MG tablet TAKE ONE-HALF (1/2) TABLET DAILY 90 tablet 1  . metFORMIN (GLUCOPHAGE) 1000 MG tablet TAKE 1 TABLET TWICE A DAY WITH MEALS 60 tablet 0  . Multiple Vitamin (MULTIVITAMIN) tablet Take 1 tablet by mouth daily. MEN'S ONE A DAY 50+    . pioglitazone (ACTOS) 45 MG tablet TAKE 1 TABLET DAILY 30 tablet 0  . sildenafil (VIAGRA) 100 MG tablet Take 0.5 tablets (50 mg total) by mouth daily as needed for erectile dysfunction. Reported on 10/27/2015 10 tablet 11  . vardenafil (LEVITRA) 20 MG tablet Take 20 mg by mouth. Reported on 10/27/2015    . vitamin E 400 UNIT capsule Take 400 Units by mouth daily.     No facility-administered medications prior to visit.     Allergies:  Allergies  Allergen Reactions  . Penicillins     Social History   Social History  . Marital status: Married    Spouse name: N/A  . Number of children: N/A  . Years of education: N/A   Occupational History  . Not on file.   Social History Main Topics  . Smoking status: Never Smoker  . Smokeless tobacco: Never Used  . Alcohol use 0.6 oz/week    1 Cans of beer per week  . Drug use: Unknown  . Sexual activity: Yes    Birth control/ protection:  Surgical   Other Topics Concern  . Not on file   Social History Narrative  . No narrative on file    Family History  Problem Relation Age of Onset  . Arthritis Mother   . Asthma Mother   . Hearing loss Mother   . Vision loss Mother   . Heart disease Father   . Hyperlipidemia Father   . Hypertension Father   . Early  death Sister      Review of Systems:  See HPI for pertinent ROS. All other ROS negative.    Physical Exam: Blood pressure 130/70, pulse 78, temperature 97.9 F (36.6 C), temperature source Oral, resp. rate 18, weight 286 lb 9.6 oz (130 kg), SpO2 98 %., Body mass index is 34.89 kg/m. General: WNWD WM. Appears in no acute distress. Neck: Supple. No thyromegaly. No lymphadenopathy. No carotid bruits. Lungs: Clear bilaterally to auscultation without wheezes, rales, or rhonchi. Breathing is unlabored. Heart: RRR with S1 S2. No murmurs, rubs, or gallops. Abdomen: Soft, non-tender, non-distended with normoactive bowel sounds. No hepatomegaly. No rebound/guarding. No obvious abdominal masses. Musculoskeletal:  Strength and tone normal for age. Extremities/Skin: Warm and dry. He has on boots and does not want to remove them. Says that he can see his feet and nose that there are no wounds and says he has good sensation of his feet.Also says [facility] does Diabetic Foot Exam. Sites of 3 tic bites: one on right ear lobe--no rash. Right neck-- ~ 0.5cm area of pink coloration. No lesion. No other rash. Left upper back: ~ 0.5 cm area of pink coloration. No lesion. No other rash. No erythema migrans. Neuro: Alert and oriented X 3. Moves all extremities spontaneously. Gait is normal. CNII-XII grossly in tact. Psych:  Responds to questions appropriately with a normal affect.     ASSESSMENT AND PLAN:  66 y.o. year old male with   1. Diabetes mellitus without complication (HCC) 02/02/2017: Check A1c and CMET. He has had diabetic foot exam at the [place] and they are scheduling him for a  diabetic eye exam. He is well aware and informed of proper foot care.  2. Essential hypertension 02/02/2017: Blood Pressure at goal. Check labs to monitor.  3. Hyperlipemia 02/02/2017: He is on statin. He is not fasting today. Says he had fasting lipid panel at the [place] around December or January. Will check LFTs now.  4. Erectile dysfunction, unspecified erectile dysfunction type 02/02/2017: This is now being managed by the [place].  5. Testosterone deficiency 02/02/2017: This is now being managed by the [place].  6. Tick bite, initial encounter He is to go ahead and start empiric treatment with doxycycline and take as directed and complete all of it. - B. burgdorfi antibodies by WB - [place] spotted fvr abs pnl(IgG+IgM) - doxycycline (VIBRA-TABS) 100 MG tablet; Take 1 tablet (100 mg total) by mouth 2 (two) times daily.  Dispense: 20 tablet; Refill: 0   - Hepatitis C Screen was obtained 01/2016---negative   Zostavax --- Given here ---06/07/2016  In the past he was to [facility] with the [facility] for his management of his chronic medical problems and will follow-up with [place] [facility] for any acute illnesses. Today he says he wants me to manage these--will check labs and f/u  Signed, Sh[place], [place], [facility] 02/03/2017 10:50 AM

## 2017-02-04 LAB — HEMOGLOBIN A1C
HEMOGLOBIN A1C: 7.3 % — AB (ref <5.7)
MEAN PLASMA GLUCOSE: 163 mg/dL

## 2017-02-05 LAB — ROCKY MTN SPOTTED FVR ABS PNL(IGG+IGM)
RMSF IgG: NOT DETECTED
RMSF IgM: NOT DETECTED

## 2017-02-07 LAB — LYME ABY, WSTRN BLT IGG & IGM W/BANDS
B burgdorferi IgG Abs (IB): NEGATIVE
B burgdorferi IgM Abs (IB): NEGATIVE
LYME DISEASE 18 KD IGG: NONREACTIVE
LYME DISEASE 23 KD IGG: NONREACTIVE
LYME DISEASE 23 KD IGM: NONREACTIVE
LYME DISEASE 39 KD IGM: NONREACTIVE
LYME DISEASE 41 KD IGG: NONREACTIVE
LYME DISEASE 41 KD IGM: NONREACTIVE
Lyme Disease 28 kD IgG: NONREACTIVE
Lyme Disease 30 kD IgG: NONREACTIVE
Lyme Disease 39 kD IgG: NONREACTIVE
Lyme Disease 45 kD IgG: NONREACTIVE
Lyme Disease 58 kD IgG: NONREACTIVE
Lyme Disease 66 kD IgG: NONREACTIVE
Lyme Disease 93 kD IgG: NONREACTIVE

## 2017-02-10 ENCOUNTER — Ambulatory Visit (INDEPENDENT_AMBULATORY_CARE_PROVIDER_SITE_OTHER): Payer: Medicare Other | Admitting: Physician Assistant

## 2017-02-10 VITALS — BP 136/64 | HR 68 | Temp 97.7°F | Resp 18 | Wt 288.0 lb

## 2017-02-10 DIAGNOSIS — E349 Endocrine disorder, unspecified: Secondary | ICD-10-CM

## 2017-02-10 DIAGNOSIS — M1000 Idiopathic gout, unspecified site: Secondary | ICD-10-CM | POA: Diagnosis not present

## 2017-02-10 MED ORDER — ALLOPURINOL 100 MG PO TABS: 100.0000 mg | ORAL_TABLET | Freq: Every day | ORAL | 6 refills | Status: DC

## 2017-02-10 MED ORDER — COLCHICINE 0.6 MG PO TABS: ORAL_TABLET | ORAL | 2 refills | Status: DC

## 2017-02-10 MED ORDER — TESTOSTERONE CYPIONATE 200 MG/ML IM SOLN: 200.0000 mg | INTRAMUSCULAR | 0 refills | Status: DC

## 2017-02-10 NOTE — Progress Notes (Signed)
Patient ID: CLETIS CLACK MRN: [id number], DOB: Oct 25, 1950, 66 y.o. Date of Encounter: @  Chief Complaint:  Chief Complaint  Patient presents with  . discuss gout    HPI: 66 y.o. year old male  presents with above.   He brings in lab results that were performed at the [place] on 11/17/16. He wanted to follow-up a couple of these issues here.  Gout: On lab 11/17/16 uric acid 7.9. He says that at times occasionally his left first toe will hurt and feels sore. He has never been on gout medicine but is interested in starting this.  Low testosterone: He states that the [place] will not prescribe treatment for low testosterone unless the level is severely low that they have adjusted their threshold for treating this. Says that he was on injection for low T in the past and feels that he needs to get back on this treatment. Is feeling decreased energy, decreased muscle tone.  No other concerns to address today.   Past Medical History:  Diagnosis Date  . Diabetes mellitus without complication (HCC) 2002  . Hypertension 2002     Home Meds: Outpatient Medications Prior to Visit  Medication Sig Dispense Refill  . aspirin 325 MG tablet Take 325 mg by mouth daily.    Marland Kitchen atorvastatin (LIPITOR) 80 MG tablet TAKE ONE-HALF (1/2) TABLET DAILY 90 tablet 1  . diclofenac (VOLTAREN) 50 MG EC tablet Take 1 tablet (50 mg total) by mouth 3 (three) times daily. 90 tablet 0  . doxycycline (VIBRA-TABS) 100 MG tablet Take 1 tablet (100 mg total) by mouth 2 (two) times daily. 20 tablet 0  . glyBURIDE (DIABETA) 5 MG tablet TAKE 2 TABLETS TWICE A DAY WITH MEALS 120 tablet 0  . hydrOXYzine (ATARAX/VISTARIL) 25 MG tablet Take 1 tablet (25 mg total) by mouth 3 (three) times daily as needed. 90 tablet 1  . lisinopril (PRINIVIL,ZESTRIL) 20 MG tablet TAKE ONE-HALF (1/2) TABLET DAILY 90 tablet 1  . metFORMIN (GLUCOPHAGE) 1000 MG tablet TAKE 1 TABLET TWICE A DAY WITH MEALS 60 tablet 0  . Multiple Vitamin (MULTIVITAMIN)  tablet Take 1 tablet by mouth daily. MEN'S ONE A DAY 50+    . pioglitazone (ACTOS) 45 MG tablet TAKE 1 TABLET DAILY 30 tablet 0  . sildenafil (VIAGRA) 100 MG tablet Take 0.5 tablets (50 mg total) by mouth daily as needed for erectile dysfunction. Reported on 10/27/2015 10 tablet 11  . vardenafil (LEVITRA) 20 MG tablet Take 20 mg by mouth. Reported on 10/27/2015    . vitamin E 400 UNIT capsule Take 400 Units by mouth daily.     No facility-administered medications prior to visit.     Allergies:  Allergies  Allergen Reactions  . Penicillins     Social History   Social History  . Marital status: Married    Spouse name: N/A  . Number of children: N/A  . Years of education: N/A   Occupational History  . Not on file.   Social History Main Topics  . Smoking status: Never Smoker  . Smokeless tobacco: Never Used  . Alcohol use 0.6 oz/week    1 Cans of beer per week  . Drug use: Unknown  . Sexual activity: Yes    Birth control/ protection: Surgical   Other Topics Concern  . Not on file   Social History Narrative  . No narrative on file    Family History  Problem Relation Age of Onset  . Arthritis Mother   .  Asthma Mother   . Hearing loss Mother   . Vision loss Mother   . Heart disease Father   . Hyperlipidemia Father   . Hypertension Father   . Early death Sister      Review of Systems:  See HPI for pertinent ROS. All other ROS negative.    Physical Exam: Blood pressure 136/64, pulse 68, temperature 97.7 F (36.5 C), temperature source Oral, resp. rate 18, weight 288 lb (130.6 kg), SpO2 97 %., Body mass index is 35.06 kg/m. General: WNWD WM. Appears in no acute distress. Neck: Supple. No thyromegaly. No lymphadenopathy. Lungs: Clear bilaterally to auscultation without wheezes, rales, or rhonchi. Breathing is unlabored. Heart: RRR with S1 S2. No murmurs, rubs, or gallops. Abdomen: Soft, non-tender, non-distended with normoactive bowel sounds. No hepatomegaly. No  rebound/guarding. No obvious abdominal masses. Musculoskeletal:  Strength and tone normal for age. Extremities/Skin: Warm and dry. Neuro: Alert and oriented X 3. Moves all extremities spontaneously. Gait is normal. CNII-XII grossly in tact. Psych:  Responds to questions appropriately with a normal affect.     ASSESSMENT AND PLAN:  66 y.o. year old male with  1. Idiopathic gout, unspecified chronicity, unspecified site 11/17/16 uric acid 7.9 He is on no HCTZ or niacin. Discussed with him that when he first starts the allopurinol, the uric acid will be mobilized and can cause a flare and if this occurs then used the colchicine as directed. Otherwise he is to start the allopurinol and take this daily. - allopurinol (ZYLOPRIM) 100 MG tablet; Take 1 tablet (100 mg total) by mouth daily.  Dispense: 30 tablet; Refill: 6 - colchicine 0.6 MG tablet; At sign of gout flare: Take 2. One hour later--Take 1. Then, take 1 twice a day until flare resolves  Dispense: 30 tablet; Refill: 2  2. Testosterone deficiency On 11/17/16 testosterone level -- 259. Lipid panel showed total cholesterol 121, triglyceride 103, HDL 39, and LDL 61. CMET was normal with normal LFTs and PSA was normal at 0.65. Have him go ahead and start testosterone injections every 2 weeks. He is to have follow-up visit in 3 months. - testosterone cypionate (DEPO-TESTOSTERONE) 200 MG/ML injection; Inject 1 mL (200 mg total) into the muscle every 14 (fourteen) days.  Dispense: 10 mL; Refill: 0   Signed, [address] [place], [place], [facility] 02/10/2017 12:07 PM

## 2017-05-16 ENCOUNTER — Ambulatory Visit (INDEPENDENT_AMBULATORY_CARE_PROVIDER_SITE_OTHER): Payer: Medicare Other | Admitting: Physician Assistant

## 2017-05-16 ENCOUNTER — Encounter: Payer: Self-pay | Admitting: Physician Assistant

## 2017-05-16 VITALS — BP 118/60 | HR 73 | Temp 97.5°F | Resp 14 | Ht 76.0 in | Wt 283.6 lb

## 2017-05-16 DIAGNOSIS — E349 Endocrine disorder, unspecified: Secondary | ICD-10-CM | POA: Diagnosis not present

## 2017-05-16 DIAGNOSIS — M1000 Idiopathic gout, unspecified site: Secondary | ICD-10-CM | POA: Diagnosis not present

## 2017-05-16 DIAGNOSIS — M109 Gout, unspecified: Secondary | ICD-10-CM | POA: Insufficient documentation

## 2017-05-16 MED ORDER — TESTOSTERONE CYPIONATE 200 MG/ML IM SOLN: 200.0000 mg | INTRAMUSCULAR | 0 refills | Status: DC

## 2017-05-16 NOTE — Progress Notes (Signed)
Patient ID: Keith Schwartz MRN: [id number], DOB: 1950-12-22, 66 y.o. Date of Encounter: @DATE @  Chief Complaint:  Chief Complaint  Patient presents with  . 3 month follow up    HPI: 66 y.o. year old male  presents with above.    02/10/2017: He brings in lab results that were performed at the [place] on 11/17/16. He wanted to follow-up a couple of these issues here.  Gout: On lab 11/17/16 uric acid 7.9. He says that at times occasionally his left first toe will hurt and feels sore. He has never been on gout medicine but is interested in starting this.  Low testosterone: He states that the [place] will not prescribe treatment for low testosterone unless the level is severely low that they have adjusted their threshold for treating this. Says that he was on injection for low T in the past and feels that he needs to get back on this treatment. Is feeling decreased energy, decreased muscle tone.  No other concerns to address today.  1. Idiopathic gout, unspecified chronicity, unspecified site 11/17/16 uric acid 7.9 He is on no HCTZ or niacin. Discussed with him that when he first starts the allopurinol, the uric acid will be mobilized and can cause a flare and if this occurs then used the colchicine as directed. Otherwise he is to start the allopurinol and take this daily. - allopurinol (ZYLOPRIM) 100 MG tablet; Take 1 tablet (100 mg total) by mouth daily.  Dispense: 30 tablet; Refill: 6 - colchicine 0.6 MG tablet; At sign of gout flare: Take 2. One hour later--Take 1. Then, take 1 twice a day until flare resolves  Dispense: 30 tablet; Refill: 2  2. Testosterone deficiency On 11/17/16 testosterone level -- 259. Lipid panel showed total cholesterol 121, triglyceride 103, HDL 39, and LDL 61. CMET was normal with normal LFTs and PSA was normal at 0.65. Have him go ahead and start testosterone injections every 2 weeks. He is to have follow-up visit in 3 months. - testosterone cypionate  (DEPO-TESTOSTERONE) 200 MG/ML injection; Inject 1 mL (200 mg total) into the muscle every 14 (fourteen) days.  Dispense: 10 mL; Refill: 0      05/16/2017: Today I reviewed above information. When asked if he was taking allopurinol daily he responds  "I guess so. I'm taking whatever you gave me ". He does say that he feels like his gout is better. Is having less stiffness and achiness in his joints. When I asked about the testosterone injections he says that he hasn't had an injection in a while because he didn't have a refill and wasn't sure if he had refills available. I then reviewed with him that the prior Rx was for 10 mL's and he was supposed to be administering 1 mL per injection and doing an injection every 2 weeks. He reports that he only did 2 injections. Says that he never could tell much difference in any symptoms after doing the injections. He has no idea what amount of medication was injected etc. also [facility] so I don't know if the [place] gave the injection and gave a different amount or what.     Past Medical History:  Diagnosis Date  . Diabetes mellitus without complication (HCC) 2002  . Hypertension 2002     Home Meds: Outpatient Medications Prior to Visit  Medication Sig Dispense Refill  . allopurinol (ZYLOPRIM) 100 MG tablet Take 1 tablet (100 mg total) by mouth daily. 30 tablet  6  . aspirin 325 MG tablet Take 325 mg by mouth daily.    Marland Kitchen. atorvastatin (LIPITOR) 80 MG tablet TAKE ONE-HALF (1/2) TABLET DAILY 90 tablet 1  . colchicine 0.6 MG tablet At sign of gout flare: Take 2. One hour later--Take 1. Then, take 1 twice a day until flare resolves 30 tablet 2  . diclofenac (VOLTAREN) 50 MG EC tablet Take 1 tablet (50 mg total) by mouth 3 (three) times daily. 90 tablet 0  . glyBURIDE (DIABETA) 5 MG tablet TAKE 2 TABLETS TWICE A DAY WITH MEALS 120 tablet 0  . hydrOXYzine (ATARAX/VISTARIL) 25 MG tablet Take 1 tablet (25 mg total) by mouth 3 (three) times daily  as needed. 90 tablet 1  . lisinopril (PRINIVIL,ZESTRIL) 20 MG tablet TAKE ONE-HALF (1/2) TABLET DAILY 90 tablet 1  . metFORMIN (GLUCOPHAGE) 1000 MG tablet TAKE 1 TABLET TWICE A DAY WITH MEALS 60 tablet 0  . Multiple Vitamin (MULTIVITAMIN) tablet Take 1 tablet by mouth daily. MEN'S ONE A DAY 50+    . pioglitazone (ACTOS) 45 MG tablet TAKE 1 TABLET DAILY 30 tablet 0  . sildenafil (VIAGRA) 100 MG tablet Take 0.5 tablets (50 mg total) by mouth daily as needed for erectile dysfunction. Reported on 10/27/2015 10 tablet 11  . vardenafil (LEVITRA) 20 MG tablet Take 20 mg by mouth. Reported on 10/27/2015    . vitamin E 400 UNIT capsule Take 400 Units by mouth daily.    Marland Kitchen. testosterone cypionate (DEPO-TESTOSTERONE) 200 MG/ML injection Inject 1 mL (200 mg total) into the muscle every 14 (fourteen) days. 10 mL 0  . doxycycline (VIBRA-TABS) 100 MG tablet Take 1 tablet (100 mg total) by mouth 2 (two) times daily. 20 tablet 0  . doxycycline (VIBRAMYCIN) 100 MG capsule Take 100 mg by mouth 2 (two) times daily with a meal.     No facility-administered medications prior to visit.     Allergies:  Allergies  Allergen Reactions  . Penicillins     Social History   Social History  . Marital status: Married    Spouse name: N/A  . Number of children: N/A  . Years of education: N/A   Occupational History  . Not on file.   Social History Main Topics  . Smoking status: Never Smoker  . Smokeless tobacco: Never Used  . Alcohol use 0.6 oz/week    1 Cans of beer per week  . Drug use: Unknown  . Sexual activity: Yes    Birth control/ protection: Surgical   Other Topics Concern  . Not on file   Social History Narrative  . No narrative on file    Family History  Problem Relation Age of Onset  . Arthritis Mother   . Asthma Mother   . Hearing loss Mother   . Vision loss Mother   . Heart disease Father   . Hyperlipidemia Father   . Hypertension Father   . Early death Sister      Review of Systems:   See HPI for pertinent ROS. All other ROS negative.    Physical Exam: Blood pressure 118/60, pulse 73, temperature (!) 97.5 F (36.4 C), temperature source Oral, resp. rate 14, height 6\' 4"  (1.93 m), weight 283 lb 9.6 oz (128.6 kg), SpO2 95 %., Body mass index is 34.52 kg/m. General: WNWD WM. Appears in no acute distress. Neck: Supple. No thyromegaly. No lymphadenopathy. Lungs: Clear bilaterally to auscultation without wheezes, rales, or rhonchi. Breathing is unlabored. Heart: RRR with S1 S2. No  murmurs, rubs, or gallops. Abdomen: Soft, non-tender, non-distended with normoactive bowel sounds. No hepatomegaly. No rebound/guarding. No obvious abdominal masses. Musculoskeletal:  Strength and tone normal for age. Extremities/Skin: Warm and dry. Neuro: Alert and oriented X 3. Moves all extremities spontaneously. Gait is normal. CNII-XII grossly in tact. Psych:  Responds to questions appropriately with a normal affect.     ASSESSMENT AND PLAN:  66 y.o. year old male with   1. Testosterone deficiency I have printed him one more prescription that reads as follows----also discussed with him that is supposed to be 1 mL every 14 days and therefore this should be enough for 10 injections. Discussed that he can come here and have nurse here administer the injection to make sure he is getting it correctly. He will then return for follow-up visit with me for follow-up labs and evaluation in 3 months. - testosterone cypionate (DEPO-TESTOSTERONE) 200 MG/ML injection; Inject 1 mL (200 mg total) into the muscle every 14 (fourteen) days.  Dispense: 10 mL; Refill: 0  2. Idiopathic gout, unspecified chronicity, unspecified site I prescribed allopurinol at [facility] 02/10/17. Will recheck uric acid level now. - Uric acid    Signed, [address]Mary Beth East [place], [place], [facility] 05/16/2017 1:58 PM

## 2017-05-17 LAB — URIC ACID: Uric Acid, Serum: 7.3 mg/dL (ref 4.0–8.0)

## 2017-05-24 ENCOUNTER — Other Ambulatory Visit: Payer: Self-pay

## 2017-05-24 MED ORDER — ALLOPURINOL 300 MG PO TABS: 300.0000 mg | ORAL_TABLET | Freq: Every day | ORAL | 5 refills | Status: DC

## 2017-10-18 HISTORY — PX: OTHER SURGICAL HISTORY: SHX169

## 2018-01-04 ENCOUNTER — Encounter: Payer: Self-pay | Admitting: Physician Assistant

## 2018-01-04 ENCOUNTER — Ambulatory Visit (INDEPENDENT_AMBULATORY_CARE_PROVIDER_SITE_OTHER): Payer: Medicare Other | Admitting: Physician Assistant

## 2018-01-04 VITALS — BP 98/60 | HR 86 | Temp 98.2°F | Resp 16 | Wt 261.6 lb

## 2018-01-04 DIAGNOSIS — A084 Viral intestinal infection, unspecified: Secondary | ICD-10-CM

## 2018-01-04 DIAGNOSIS — R52 Pain, unspecified: Secondary | ICD-10-CM

## 2018-01-04 LAB — INFLUENZA A AND B AG, IMMUNOASSAY
INFLUENZA A ANTIGEN: NOT DETECTED
INFLUENZA B ANTIGEN: NOT DETECTED

## 2018-01-04 MED ORDER — PROMETHAZINE HCL 25 MG PO TABS: 25.0000 mg | ORAL_TABLET | Freq: Four times a day (QID) | ORAL | 0 refills | Status: DC | PRN

## 2018-01-04 NOTE — Progress Notes (Signed)
Patient ID: Keith Schwartz MRN: [id number], DOB: 1951/08/25, 67 y.o. Date of Encounter: 01/04/2018, 3:49 PM    Chief Complaint:  Chief Complaint  Patient presents with  . Fever    symptoms started last night  . Diarrhea  . Emesis  . Generalized Body Aches  . Chills     HPI: 67 y.o. year old male presents with above.   Wife accompanies him for visit. Reports that their son and his family were just here visiting.   Reports that their grandson vomited on Sunday night and again on Monday night. They report that the son vomited yesterday.  Patient developed vomiting and diarrhea last night.  Reports that he had multiple episodes of both vomiting and diarrhea last night.  This morning he felt weak.  This morning he reported to wife that he was feeling chills but wife states that he felt warm to the touch.  States that she gave him 2 Tylenol 1 hour ago.  Also gave him 1 Imodium.  Has been giving him Gatorade and Powerade.  Last night he was drinking water.  He has had no focal/localized abdominal pain.  Has had diffuse crampy abdominal pain.    Home Meds:   Outpatient Medications Prior to Visit  Medication Sig Dispense Refill  . allopurinol (ZYLOPRIM) 300 MG tablet Take 1 tablet (300 mg total) by mouth daily. 30 tablet 5  . aspirin 325 MG tablet Take 325 mg by mouth daily.    Marland Kitchen atorvastatin (LIPITOR) 80 MG tablet TAKE ONE-HALF (1/2) TABLET DAILY 90 tablet 1  . colchicine 0.6 MG tablet At sign of gout flare: Take 2. One hour later--Take 1. Then, take 1 twice a day until flare resolves 30 tablet 2  . diclofenac (VOLTAREN) 50 MG EC tablet Take 1 tablet (50 mg total) by mouth 3 (three) times daily. 90 tablet 0  . glyBURIDE (DIABETA) 5 MG tablet TAKE 2 TABLETS TWICE A DAY WITH MEALS 120 tablet 0  . hydrOXYzine (ATARAX/VISTARIL) 25 MG tablet Take 1 tablet (25 mg total) by mouth 3 (three) times daily as needed. 90 tablet 1  . lisinopril (PRINIVIL,ZESTRIL) 20 MG tablet TAKE ONE-HALF  (1/2) TABLET DAILY 90 tablet 1  . metFORMIN (GLUCOPHAGE) 1000 MG tablet TAKE 1 TABLET TWICE A DAY WITH MEALS 60 tablet 0  . Multiple Vitamin (MULTIVITAMIN) tablet Take 1 tablet by mouth daily. MEN'S ONE A DAY 50+    . pioglitazone (ACTOS) 45 MG tablet TAKE 1 TABLET DAILY 30 tablet 0  . sildenafil (VIAGRA) 100 MG tablet Take 0.5 tablets (50 mg total) by mouth daily as needed for erectile dysfunction. Reported on 10/27/2015 10 tablet 11  . testosterone cypionate (DEPO-TESTOSTERONE) 200 MG/ML injection Inject 1 mL (200 mg total) into the muscle every 14 (fourteen) days. 10 mL 0  . vardenafil (LEVITRA) 20 MG tablet Take 20 mg by mouth. Reported on 10/27/2015    . vitamin E 400 UNIT capsule Take 400 Units by mouth daily.     No facility-administered medications prior to visit.     Allergies:  Allergies  Allergen Reactions  . Penicillins       Review of Systems: See HPI for pertinent ROS. All other ROS negative.    Physical Exam: Blood pressure 98/60, pulse 86, temperature 98.2 F (36.8 C), temperature source Oral, resp. rate 16, weight 118.7 kg (261 lb 9.6 oz), SpO2 96 %., Body mass index is 31.84 kg/m. General:  WNWD WM. Appears in no acute distress.  Does not appear toxic. Neck: Supple. No thyromegaly. No lymphadenopathy. Lungs: Clear bilaterally to auscultation without wheezes, rales, or rhonchi. Breathing is unlabored. Heart: Regular rhythm. No murmurs, rubs, or gallops. Abdomen: Soft,  non-distended with normoactive bowel sounds. No hepatomegaly. No rebound/guarding. No obvious abdominal masses.  Has some tenderness with palpation in the epigastric region (felt to be secondary to recent vomiting).  No other area of tenderness with palpation. Msk:  Strength and tone normal for age. Extremities/Skin: Warm and dry.  Neuro: Alert and oriented X 3. Moves all extremities spontaneously. Gait is normal. CNII-XII grossly in tact. Psych:  Responds to questions appropriately with a normal affect.       ASSESSMENT AND PLAN:  67 y.o. year old male with  1. Viral gastroenteritis Discussed that even if he starts having an appetite he is to avoid solid foods.   Stick with clear liquid diet for bowel rest.   Follow-up if develops localized/focal abdominal pain or fever. Prescribe Phenergan to reduce nausea.  If diarrhea persist beyond 48 hours then can use further Imodium.  2. Generalized body aches - Influenza A and B Ag, Immunoassay   Signed, [address]Mary [address] Little Falls[place], [place], [facility] 01/04/2018 3:49 PM

## 2018-01-27 ENCOUNTER — Other Ambulatory Visit: Payer: Self-pay

## 2018-01-27 ENCOUNTER — Ambulatory Visit (HOSPITAL_COMMUNITY)
Admission: RE | Admit: 2018-01-27 | Discharge: 2018-01-27 | Disposition: A | Discharge status: Home / Self Care | Payer: Medicare Other | Source: Ambulatory Visit | Attending: Family Medicine | Admitting: Family Medicine

## 2018-01-27 ENCOUNTER — Encounter: Payer: Self-pay | Admitting: Family Medicine

## 2018-01-27 ENCOUNTER — Ambulatory Visit (INDEPENDENT_AMBULATORY_CARE_PROVIDER_SITE_OTHER): Payer: Medicare Other | Admitting: Family Medicine

## 2018-01-27 VITALS — BP 118/70 | HR 85 | Temp 98.2°F | Ht 76.0 in | Wt 256.0 lb

## 2018-01-27 DIAGNOSIS — E119 Type 2 diabetes mellitus without complications: Secondary | ICD-10-CM

## 2018-01-27 DIAGNOSIS — R05 Cough: Secondary | ICD-10-CM

## 2018-01-27 DIAGNOSIS — W57XXXA Bitten or stung by nonvenomous insect and other nonvenomous arthropods, initial encounter: Secondary | ICD-10-CM | POA: Diagnosis not present

## 2018-01-27 DIAGNOSIS — R5383 Other fatigue: Secondary | ICD-10-CM | POA: Diagnosis not present

## 2018-01-27 DIAGNOSIS — J209 Acute bronchitis, unspecified: Secondary | ICD-10-CM | POA: Diagnosis not present

## 2018-01-27 DIAGNOSIS — R509 Fever, unspecified: Secondary | ICD-10-CM

## 2018-01-27 DIAGNOSIS — R059 Cough, unspecified: Secondary | ICD-10-CM

## 2018-01-27 LAB — URINALYSIS, ROUTINE W REFLEX MICROSCOPIC
BILIRUBIN URINE: NEGATIVE
Hgb urine dipstick: NEGATIVE
Leukocytes, UA: NEGATIVE
Nitrite: NEGATIVE
PROTEIN: NEGATIVE
Specific Gravity, Urine: 1.015 (ref 1.001–1.03)
pH: 5.5 (ref 5.0–8.0)

## 2018-01-27 MED ORDER — PREDNISONE 20 MG PO TABS: ORAL_TABLET | ORAL | 0 refills | Status: DC

## 2018-01-27 MED ORDER — BENZONATATE 100 MG PO CAPS: 100.0000 mg | ORAL_CAPSULE | Freq: Three times a day (TID) | ORAL | 0 refills | Status: DC | PRN

## 2018-01-27 MED ORDER — ALBUTEROL SULFATE HFA 108 (90 BASE) MCG/ACT IN AERS
2.0000 | INHALATION_SPRAY | Freq: Four times a day (QID) | RESPIRATORY_TRACT | 0 refills | Status: DC | PRN
Start: 2018-01-27 — End: 2020-12-22

## 2018-01-27 MED ORDER — DOXYCYCLINE HYCLATE 100 MG PO TABS: 100.0000 mg | ORAL_TABLET | Freq: Two times a day (BID) | ORAL | 0 refills | Status: DC

## 2018-01-27 MED ORDER — IPRATROPIUM-ALBUTEROL 0.5-2.5 (3) MG/3ML IN SOLN
3.0000 mL | Freq: Once | RESPIRATORY_TRACT | Status: AC
  Administered 2018-01-27: 3 mL via RESPIRATORY_TRACT

## 2018-01-27 NOTE — Progress Notes (Signed)
Patient ID: Keith Schwartz, male    DOB: 01-02-1951, 67 y.o.   MRN: [id number]  PCP: Dorena Bodo, PA-C  Chief Complaint  Patient presents with  . Cough    Patient has c/o cough, low grade fever, tick bite.     Subjective:   Keith Schwartz is a 67 y.o. male, with PMH of gout, diabetes, hyperlipidemia, hypertension has multiple complaints including 1 week of cough, a tick bite, elevated blood sugars, and weight loss.   He complains of 1 week of worsening productive cough with yellow to green sputum, with associated fatigue, sweats and subjective fever   His cough is productive with green and gray sputum.  He reports hearing bubbling and crackling in between his breaths.  He is getting more tired with exertion but he denies any exertional dyspnea.  Denies hemoptysis, wheeze, chest pain.  He denies any smoking history.  While serving in [employer] he may have had a asbestos exposure however he is never been managed for this or workup for this as far as he knows, at the [place].  No history of recurrent bronchitis is, childhood asthma.  Does state that he is got pneumonia several times.  He has been sick for 7 days and 3 or 4 days ago started to feel a little bit better but he had a colonoscopy procedure and after doing the prep and having the procedure he severely worsened yesterday and today.  He also complains of a tick bite to his low central abdomen, which she states he pulled off himself a few days ago, is noted to be very small in size.  Now has a red area around it.    He also complains of at least 27 pound weight loss, unintentional over the past several months.  He is unsure of his medication changes for his diabetes, and did not bring an updated med list today.  But he believes 1 of his medications was changed for diabetes and since then he has been urinating more frequently.  After fasting and doing bowel prep for the colonoscopy, he had fasted for over 24 hours and yesterday morning  his blood sugar was 180 when he was going in for the colonoscopy procedure.  He endorses nocturia, polyuria.  He does not check his blood sugar at home at all because it hurts.  He denies CP, LE edema, near syncope, palpitations.    Patient Active Problem List   Diagnosis Date Noted  . Gout 05/16/2017  . Hyperlipemia 10/27/2015  . Erectile dysfunction 10/27/2015  . Testosterone deficiency 10/27/2015  . Diabetes mellitus without complication (HCC)   . Hypertension      Prior to Admission medications   Medication Sig Start Date End Date Taking? Authorizing Provider  allopurinol (ZYLOPRIM) 300 MG tablet Take 1 tablet (300 mg total) by mouth daily. 05/24/17  Yes Dorena Bodo, PA-C  aspirin 325 MG tablet Take 325 mg by mouth daily.   Yes [provider]  atorvastatin (LIPITOR) 80 MG tablet TAKE ONE-HALF (1/2) TABLET DAILY 12/02/16  Yes Donita Brooks, MD  glimepiride (AMARYL) 2 MG tablet  12/28/17  Yes [provider]  hydrOXYzine (ATARAX/VISTARIL) 25 MG tablet Take 1 tablet (25 mg total) by mouth 3 (three) times daily as needed. 11/24/15  Yes Donita Brooks, MD  lisinopril (PRINIVIL,ZESTRIL) 20 MG tablet TAKE ONE-HALF (1/2) TABLET DAILY 10/14/16  Yes Donita Brooks, MD  metFORMIN (GLUCOPHAGE) 1000 MG tablet TAKE 1 TABLET  TWICE A DAY WITH MEALS 12/06/16  Yes Allayne Butcher B, PA-C  Multiple Vitamin (MULTIVITAMIN) tablet Take 1 tablet by mouth daily. MEN'S ONE A DAY 50+   Yes [provider]  pioglitazone (ACTOS) 45 MG tablet TAKE 1 TABLET DAILY 12/06/16  Yes Allayne Butcher B, PA-C  vitamin E 400 UNIT capsule Take 400 Units by mouth daily.   Yes [provider]  colchicine 0.6 MG tablet At sign of gout flare: Take 2. One hour later--Take 1. Then, take 1 twice a day until flare resolves Patient not taking: Reported on 01/27/2018 02/10/17   Dorena Bodo, PA-C  diclofenac (VOLTAREN) 50 MG EC tablet Take 1 tablet (50 mg total) by mouth 3 (three) times  daily. Patient not taking: Reported on 01/27/2018 02/03/17   Allayne Butcher B, PA-C  glyBURIDE (DIABETA) 5 MG tablet TAKE 2 TABLETS TWICE A DAY WITH MEALS Patient not taking: Reported on 01/27/2018 12/06/16   Dorena Bodo, PA-C  promethazine (PHENERGAN) 25 MG tablet Take 1 tablet (25 mg total) by mouth every 6 (six) hours as needed for nausea or vomiting. Patient not taking: Reported on 01/27/2018 01/04/18   Allayne Butcher B, PA-C  sildenafil (VIAGRA) 100 MG tablet Take 0.5 tablets (50 mg total) by mouth daily as needed for erectile dysfunction. Reported on 10/27/2015 Patient not taking: Reported on 01/27/2018 06/18/16   Allayne Butcher B, PA-C  testosterone cypionate (DEPO-TESTOSTERONE) 200 MG/ML injection Inject 1 mL (200 mg total) into the muscle every 14 (fourteen) days. Patient not taking: Reported on 01/27/2018 05/16/17   Allayne Butcher B, PA-C  vardenafil (LEVITRA) 20 MG tablet Take 20 mg by mouth. Reported on 10/27/2015    [provider]     Allergies  Allergen Reactions  . Penicillins      Family History  Problem Relation Age of Onset  . Arthritis Mother   . Asthma Mother   . Hearing loss Mother   . Vision loss Mother   . Heart disease Father   . Hyperlipidemia Father   . Hypertension Father   . Early death Sister      Social History   Socioeconomic History  . Marital status: Married    Spouse name: Not on file  . Number of children: Not on file  . Years of education: Not on file  . Highest education level: Not on file  Occupational History  . Not on file  Social Needs  . Financial resource strain: Not on file  . Food insecurity:    Worry: Not on file    Inability: Not on file  . Transportation needs:    Medical: Not on file    Non-medical: Not on file  Tobacco Use  . Smoking status: Never Smoker  . Smokeless tobacco: Never Used  Substance and Sexual Activity  . Alcohol use: Yes    Alcohol/week: 0.6 oz    Types: 1 Cans of beer per week  . Drug use: Not on file  .  Sexual activity: Yes    Birth control/protection: Surgical  Lifestyle  . Physical activity:    Days per week: Not on file    Minutes per session: Not on file  . Stress: Not on file  Relationships  . Social connections:    Talks on phone: Not on file    Gets together: Not on file    Attends religious service: Not on file    Active member of club or organization: Not on file  Attends meetings of clubs or organizations: Not on file    Relationship status: Not on file  . Intimate partner violence:    Fear of current or ex partner: Not on file    Emotionally abused: Not on file    Physically abused: Not on file    Forced sexual activity: Not on file  Other Topics Concern  . Not on file  Social History Narrative  . Not on file     Review of Systems  Constitutional: Positive for activity change, chills, diaphoresis, fatigue and fever. Negative for appetite change and unexpected weight change.  HENT: Positive for rhinorrhea. Negative for congestion, nosebleeds, postnasal drip, sinus pressure, sinus pain, sneezing and sore throat.   Eyes: Negative.   Respiratory: Positive for cough. Negative for chest tightness, shortness of breath and wheezing.   Cardiovascular: Negative for chest pain, palpitations and leg swelling.  Gastrointestinal: Negative.  Negative for abdominal pain, diarrhea, nausea and vomiting.  Endocrine: Positive for polydipsia and polyuria.  Genitourinary: Positive for frequency. Negative for decreased urine volume, difficulty urinating, dysuria, enuresis, flank pain, genital sores, hematuria and urgency.  Musculoskeletal: Negative.   Skin: Negative.  Negative for color change, pallor and rash.  Neurological: Positive for weakness. Negative for dizziness, tremors, syncope, light-headedness, numbness and headaches.  Hematological: Negative.   Psychiatric/Behavioral: Negative.        Objective:    Vitals:   01/27/18 1414  BP: 118/70  Pulse: 85  Temp: 98.2 F  (36.8 C)  TempSrc: Oral  SpO2: 98%  Weight: 256 lb (116.1 kg)  Height: 6\' 4"  (1.93 m)     Physical Exam  Constitutional: He is oriented to person, place, and time. No distress.  Mildly ill-appearing male, looks older than stated age, frequent cough, no acute distress  HENT:  Head: Normocephalic and atraumatic.  Mouth/Throat: No oropharyngeal exudate.  Mucous membranes mildly dry, tongue matted Nasal mucosa erythematous with clear discharge Posterior oropharynx without edema or erythema No sinus tenderness to palpation  Eyes: Pupils are equal, round, and reactive to light. Conjunctivae are normal. Right eye exhibits no discharge. Left eye exhibits no discharge. No scleral icterus.  Neck: Normal range of motion. Neck supple. No JVD present. No tracheal deviation present.  Cardiovascular: Normal rate, regular rhythm, normal heart sounds and intact distal pulses. Exam reveals no gallop and no friction rub.  No murmur heard. Pulmonary/Chest: Effort normal. No stridor. No respiratory distress. He has wheezes. He has no rales. He exhibits no tenderness.  Wheeze in the mid to lower lung fields, scattered rhonchi  Abdominal: Soft. Bowel sounds are normal. He exhibits no distension. There is no tenderness. There is no rebound and no guarding.  Musculoskeletal: Normal range of motion.  Lymphadenopathy:    He has no cervical adenopathy.  Neurological: He is alert and oriented to person, place, and time. He exhibits normal muscle tone. Coordination normal.  Skin: Skin is warm and dry. Capillary refill takes less than 2 seconds. No rash noted. He is not diaphoretic. There is erythema. No pallor.  2 cm circular area of erythema, minimal edema, no induration, no warmth, no fluctuance.  No central clearing  Psychiatric: Judgment normal.  Nursing note and vitals reviewed.    Results for orders placed or performed in visit on 01/27/18 (from the past 24 hour(s))  Urinalysis, Routine w reflex  microscopic     Status: Abnormal   Collection Time: 01/27/18  3:26 PM  Result Value Ref Range   Color, Urine YELLOW  YELLOW   APPearance CLEAR CLEAR   Specific Gravity, Urine 1.015 1.001 - 1.03   pH 5.5 5.0 - 8.0   Glucose, UA 2+ (A) NEGATIVE   Bilirubin Urine NEGATIVE NEGATIVE   Ketones, ur TRACE (A) NEGATIVE   Hgb urine dipstick NEGATIVE NEGATIVE   Protein, ur NEGATIVE NEGATIVE   Nitrite NEGATIVE NEGATIVE   Leukocytes, UA NEGATIVE NEGATIVE         Assessment & Plan:   Problem List Items Addressed This Visit      Endocrine   Diabetes mellitus without complication (HCC)   Relevant Medications   glimepiride (AMARYL) 2 MG tablet   Other Relevant Orders   Basic Metabolic Panel   Urinalysis, Routine w reflex microscopic (Completed)   Hemoglobin A1c    Other Visit Diagnoses    Acute bronchitis, unspecified organism    -  Primary   Relevant Medications   ipratropium-albuterol (DUONEB) 0.5-2.5 (3) MG/3ML nebulizer solution 3 mL (Completed)   benzonatate (TESSALON) 100 MG capsule   albuterol (PROVENTIL HFA;VENTOLIN HFA) 108 (90 Base) MCG/ACT inhaler   predniSONE (DELTASONE) 20 MG tablet   Other Relevant Orders   Basic Metabolic Panel   Fever, unspecified fever cause       Relevant Orders   CBC with Differential/Platelet   Basic Metabolic Panel   Urinalysis, Routine w reflex microscopic (Completed)   Tick bite, initial encounter       Relevant Medications   doxycycline (VIBRA-TABS) 100 MG tablet   Cough       Relevant Orders   DG Chest 2 View (Completed)     Patient given breathing treatment in clinic, lungs reevaluated with cleared wheeze, persisting scattered rhonchi worse in the left lower lobe.  Patient was able to ambulate without any desaturation on pulse ox, heart rate and respiratory rate normal.  Chest x-ray ordered.  Bronchitis versus community-acquired pneumonia will evaluate with chest x-ray which patient states that he cannot get today but he will try and get  tomorrow at the latest he will get Monday.  Will cover with doxycycline right now because of his other issue of tick bite however if he does have pneumonia he will need Augmentin as well.   Patient also given a shorter and decrease dose of prednisone, albuterol inhaler and cough suppressant.  Encouraged Mucinex.   Tick bite has mild area of erythema surrounding small lesion, no fluctuance or induration, covering with Doxy.  Unclear if symptoms are related to tick bite or if his fatigue and fever and sweats are from URI and bronchitis versus pneumonia.  Basic labs obtained to evaluate electrolytes kidney function and glucose and diabetes control.  Patient will call us with a list of medications.  He expressed that he would like for Shon Hale to perhaps take over management of his diabetes.  Is currently unclear to me, new to the patient, to determine who is managing his diabetes.  Chart review shows Frazier Richards, PA-C prescribing managing diabetes but patient states it is at the [place] and they have change his medication however he does not know what those changes are or what the meds are.Marland Kitchen   Uncontrolled diabetes/hyperglycemia may be the cause of some of his weight loss but is unclear at this time with limited history and limited med list.  I discussed with the patient the importance of coming in for a follow-up visit or well visit to address these issues in depth.    Labs pending.  Patient vital signs were  stable, feel he was safe to go home and wait for outpatient lab work.  Did discuss with him concerning signs and symptoms which he should go to the ER for, patient verbalized understanding.  Danelle Berry, PA-C 01/27/18 3:02 PM

## 2018-01-27 NOTE — Patient Instructions (Addendum)
We will start treating with doxycycline to help cover for tickborne illness and to cover for possible lung infection.  It is first line for pneumonia for someone with no complicated medical history.  First would like you if the chest x-ray shows pneumonia we will need to add on another antibiotic.  But I will wait to do that until we get the x-ray results.  I do suspect that you have bronchitis which is usually a viral illness, this is usually treated with Mucinex, cough suppressants, steroids and breathing treatments to decrease inflammation in the airways, improve ease of breathing and to help minimize irritating cough.  I will call you with results from your blood test today.  I do need you to call us and go over your medication list that you have at home so we know how to adjust her medications.  If for any reason or at any time you feel you are going to pass out, or you have chest pain, or if you become confused you need to go to the ER immediately for evaluation.    Because you have much going on would like you to come back for recheck with either myself with with Shon Hale in 1-2 weeks to further address for the management of diabetes and to address your weight loss concerns.      Acute Bronchitis, Adult Acute bronchitis is sudden (acute) swelling of the air tubes (bronchi) in the lungs. Acute bronchitis causes these tubes to fill with mucus, which can make it hard to breathe. It can also cause coughing or wheezing. In adults, acute bronchitis usually goes away within 2 weeks. A cough caused by bronchitis may last up to 3 weeks. Smoking, allergies, and asthma can make the condition worse. Repeated episodes of bronchitis may cause further lung problems, such as chronic obstructive pulmonary disease (COPD). What are the causes? This condition can be caused by germs and by substances that irritate the lungs, including:  Cold and flu viruses. This condition is most often caused by the same  virus that causes a cold.  Bacteria.  Exposure to tobacco smoke, dust, fumes, and air pollution.  What increases the risk? This condition is more likely to develop in people who:  Have close contact with someone with acute bronchitis.  Are exposed to lung irritants, such as tobacco smoke, dust, fumes, and vapors.  Have a weak immune system.  Have a respiratory condition such as asthma.  What are the signs or symptoms? Symptoms of this condition include:  A cough.  Coughing up clear, yellow, or green mucus.  Wheezing.  Chest congestion.  Shortness of breath.  A fever.  Body aches.  Chills.  A sore throat.  How is this diagnosed? This condition is usually diagnosed with a physical exam. During the exam, your health care provider may order tests, such as chest X-rays, to rule out other conditions. He or she may also:  Test a sample of your mucus for bacterial infection.  Check the level of oxygen in your blood. This is done to check for pneumonia.  Do a chest X-ray or lung function testing to rule out pneumonia and other conditions.  Perform blood tests.  Your health care provider will also ask about your symptoms and medical history. How is this treated? Most cases of acute bronchitis clear up over time without treatment. Your health care provider may recommend:  Drinking more fluids. Drinking more makes your mucus thinner, which may make it easier to breathe.  Taking a medicine for a fever or cough.  Taking an antibiotic medicine.  Using an inhaler to help improve shortness of breath and to control a cough.  Using a cool mist vaporizer or humidifier to make it easier to breathe.  Follow these instructions at home: Medicines  Take over-the-counter and prescription medicines only as told by your health care provider.  If you were prescribed an antibiotic, take it as told by your health care provider. Do not stop taking the antibiotic even if you start  to feel better. General instructions  Get plenty of rest.  Drink enough fluids to keep your urine clear or pale yellow.  Avoid smoking and secondhand smoke. Exposure to cigarette smoke or irritating chemicals will make bronchitis worse. If you smoke and you need help quitting, ask your health care provider. Quitting smoking will help your lungs heal faster.  Use an inhaler, cool mist vaporizer, or humidifier as told by your health care provider.  Keep all follow-up visits as told by your health care provider. This is important. How is this prevented? To lower your risk of getting this condition again:  Wash your hands often with soap and water. If soap and water are not available, use hand sanitizer.  Avoid contact with people who have cold symptoms.  Try not to touch your hands to your mouth, nose, or eyes.  Make sure to get the flu shot every year.  Contact a health care provider if:  Your symptoms do not improve in 2 weeks of treatment. Get help right away if:  You cough up blood.  You have chest pain.  You have severe shortness of breath.  You become dehydrated.  You faint or keep feeling like you are going to faint.  You keep vomiting.  You have a severe headache.  Your fever or chills gets worse. This information is not intended to replace advice given to you by your health care provider. Make sure you discuss any questions you have with your health care provider. Document Released: 11/11/2004 Document Revised: 04/28/2016 Document Reviewed: 03/24/2016 Elsevier Interactive Patient Education  2018 [employer].   Hyperglycemia Hyperglycemia occurs when the level of sugar (glucose) in the blood is too high. Glucose is a type of sugar that provides the body's main source of energy. Certain hormones (insulin and glucagon) control the level of glucose in the blood. Insulin lowers blood glucose, and glucagon increases blood glucose. Hyperglycemia can result from  having too little insulin in the bloodstream, or from the body not responding normally to insulin. Hyperglycemia occurs most often in people who have diabetes (diabetes mellitus), but it can happen in people who do not have diabetes. It can develop quickly, and it can be life-threatening if it causes you to become severely dehydrated (diabetic ketoacidosis or hyperglycemic hyperosmolar state). Severe hyperglycemia is a medical emergency. What are the causes? If you have diabetes, hyperglycemia may be caused by:  Diabetes medicine.  Medicines that increase blood glucose or affect your diabetes control.  Not eating enough, or not eating often enough.  Changes in physical activity level.  Being sick or having an infection.  If you have prediabetes or undiagnosed diabetes:  Hyperglycemia may be caused by those conditions.  If you do not have diabetes, hyperglycemia may be caused by:  Certain medicines, including steroid medicines, beta-blockers, epinephrine, and thiazide diuretics.  Stress.  Serious illness.  Surgery.  Diseases of the pancreas.  Infection.  What increases the risk? Hyperglycemia is more likely  to develop in people who have risk factors for diabetes, such as:  Having a family member with diabetes.  Having a gene for type 1 diabetes that is passed from parent to child (inherited).  Living in an area with cold weather conditions.  Exposure to certain viruses.  Certain conditions in which the body's disease-fighting (immune) system attacks itself (autoimmune disorders).  Being overweight or obese.  Having an inactive (sedentary) lifestyle.  Having been diagnosed with insulin resistance.  Having a history of prediabetes, gestational diabetes, or polycystic ovarian syndrome (PCOS).  Being of American-Indian, African-American, Hispanic/Latino, or Asian/Pacific Islander descent.  What are the signs or symptoms? Hyperglycemia may not cause any symptoms. If  you do have symptoms, they may include early warning signs, such as:  Increased thirst.  Hunger.  Feeling very tired.  Needing to urinate more often than usual.  Blurry vision.  Other symptoms may develop if hyperglycemia gets worse, such as:  Dry mouth.  Loss of appetite.  Fruity-smelling breath.  Weakness.  Unexpected or rapid weight gain or weight loss.  Tingling or numbness in the hands or feet.  Headache.  Skin that does not quickly return to normal after being lightly pinched and released (poor skin turgor).  Abdominal pain.  Cuts or bruises that are slow to heal.  How is this diagnosed? Hyperglycemia is diagnosed with a blood test to measure your blood glucose level. This blood test is usually done while you are having symptoms. Your health care provider may also do a physical exam and review your medical history. You may have more tests to determine the cause of your hyperglycemia, such as:  A fasting blood glucose (FBG) test. You will not be allowed to eat (you will fast) for at least 8 hours before a blood sample is taken.  An A1c (hemoglobin A1c) blood test. This provides information about blood glucose control over the previous 2-3 months.  An oral glucose tolerance test (OGTT). This measures your blood glucose at two times: ? After fasting. This is your baseline blood glucose level. ? Two hours after drinking a beverage that contains glucose.  How is this treated? Treatment depends on the cause of your hyperglycemia. Treatment may include:  Taking medicine to regulate your blood glucose levels. If you take insulin or other diabetes medicines, your medicine or dosage may be adjusted.  Lifestyle changes, such as exercising more, eating healthier foods, or losing weight.  Treating an illness or infection, if this caused your hyperglycemia.  Checking your blood glucose more often.  Stopping or reducing steroid medicines, if these caused your  hyperglycemia.  If your hyperglycemia becomes severe and it results in hyperglycemic hyperosmolar state, you must be hospitalized and given IV fluids. Follow these instructions at home: General instructions  Take over-the-counter and prescription medicines only as told by your health care provider.  Do not use any products that contain nicotine or tobacco, such as cigarettes and e-cigarettes. If you need help quitting, ask your health care provider.  Limit alcohol intake to no more than 1 drink per day for nonpregnant women and 2 drinks per day for men. One drink equals 12 oz of beer, 5 oz of wine, or 1 oz of hard liquor.  Learn to manage stress. If you need help with this, ask your health care provider.  Keep all follow-up visits as told by your health care provider. This is important. Eating and drinking  Maintain a healthy weight.  Exercise regularly, as directed by your  health care provider.  Stay hydrated, especially when you exercise, get sick, or spend time in hot temperatures.  Eat healthy foods, such as: ? Lean proteins. ? Complex carbohydrates. ? Fresh fruits and vegetables. ? Low-fat dairy products. ? Healthy fats.  Drink enough fluid to keep your urine clear or pale yellow. If you have diabetes:   Make sure you know the symptoms of hyperglycemia.  Follow your diabetes management plan, as told by your health care provider. Make sure you: ? Take your insulin and medicines as directed. ? Follow your exercise plan. ? Follow your meal plan. Eat on time, and do not skip meals. ? Check your blood glucose as often as directed. Make sure to check your blood glucose before and after exercise. If you exercise longer or in a different way than usual, check your blood glucose more often. ? Follow your sick day plan whenever you cannot eat or drink normally. Make this plan in advance with your health care provider.  Share your diabetes management plan with people in your  workplace, school, and household.  Check your urine for ketones when you are ill and as told by your health care provider.  Carry a medical alert card or wear medical alert jewelry. Contact a health care provider if:  Your blood glucose is at or above 240 mg/dL (16.1 mmol/L) for 2 days in a row.  You have problems keeping your blood glucose in your target range.  You have frequent episodes of hyperglycemia. Get help right away if:  You have difficulty breathing.  You have a change in how you think, feel, or act (mental status).  You have nausea or vomiting that does not go away. These symptoms may represent a serious problem that is an emergency. Do not wait to see if the symptoms will go away. Get medical help right away. Call your local emergency services (911 in the [place] Do not drive yourself to the hospital. Summary  Hyperglycemia occurs when the level of sugar (glucose) in the blood is too high.  Hyperglycemia is diagnosed with a blood test to measure your blood glucose level. This blood test is usually done while you are having symptoms. Your health care provider may also do a physical exam and review your medical history.  If you have diabetes, follow your diabetes management plan as told by your health care provider.  Contact your health care provider if you have problems keeping your blood glucose in your target range. This information is not intended to replace advice given to you by your health care provider. Make sure you discuss any questions you have with your health care provider. Document Released: 03/30/2001 Document Revised: 06/21/2016 Document Reviewed: 06/21/2016 Elsevier Interactive Patient Education  2018 [employer].    Tick Bite Information, Adult Ticks are insects that can bite. Most ticks live in shrubs and grassy areas. They climb onto people and animals that go by. Then they bite. Some ticks carry germs that can make you sick. How can I prevent  tick bites?  Use an insect repellent that has 20% or higher of the ingredients DEET, picaridin, or IR3535. Put this insect repellent on: ? Bare skin. ? The tops of your boots. ? Your pant legs. ? The ends of your sleeves.  If you use an insect repellent that has the ingredient permethrin, make sure to follow the instructions on the bottle. Treat the following: ? Clothing. ? Supplies. ? Boots. ? Tents.  Wear long sleeves, long pants,  and light colors.  Tuck your pant legs into your socks.  Stay in the middle of the trail.  Try not to walk through long grass.  Before going inside your house, check your clothes, hair, and skin for ticks. Make sure to check your head, neck, armpits, waist, groin, and joint areas.  Check for ticks every day.  When you come indoors: ? Wash your clothes right away. ? Shower right away. ? Dry your clothes in a dryer on high heat for 60 minutes or more. What is the right way to remove a tick? Remove a tick from your skin as soon as possible.  To remove a tick that is crawling on your skin: ? Go outdoors and brush the tick off. ? Use tape or a lint roller.  To remove a tick that is biting: ? Wash your hands. ? If you have latex gloves, put them on. ? Use tweezers, curved forceps, or a tick-removal tool to grasp the tick. Grasp the tick as close to your skin and as close to the tick's head as possible. ? Gently pull up until the tick lets go.  Try to keep the tick's head attached to its body.  Do not twist or jerk the tick.  Do not squeeze or crush the tick.  Do not try to remove a tick with heat, alcohol, petroleum jelly, or fingernail polish. How should I get rid of a tick? Here are some ways to get rid of a tick that is alive:  Place the tick in rubbing alcohol.  Place the tick in a bag or container you can close tightly.  Wrap the tick tightly in tape.  Flush the tick down the toilet.  Contact a doctor if:  You have symptoms of  a disease, such as: ? Pain in a muscle, joint, or bone. ? Trouble walking or moving your legs. ? Numbness in your legs. ? Inability to move (paralysis). ? A red rash that makes a circle (bull's-eye rash). ? Redness and swelling where the tick bit you. ? A fever. ? Throwing up (vomiting) over and over. ? Diarrhea. ? Weight loss. ? Tender and swollen lymph glands. ? Shortness of breath. ? Cough. ? Belly pain (abdominal pain). ? Headache. ? Being more tired than normal. ? A change in how alert (conscious) you are. ? Confusion. Get help right away if:  You cannot remove a tick.  A part of a tick breaks off and gets stuck in your skin.  You are feeling worse. Summary  Ticks may carry germs that can make you sick.  To prevent tick bites, wear long sleeves, long pants, and light colors. Use insect repellent. Follow the instructions on the bottle.  If the tick is biting, do not try to remove it with heat, alcohol, petroleum jelly, or fingernail polish.  Use tweezers, curved forceps, or a tick-removal tool to grasp the tick. Gently pull up until the tick lets go. Do not twist or jerk the tick. Do not squeeze or crush the tick.  If you have symptoms, contact a doctor. This information is not intended to replace advice given to you by your health care provider. Make sure you discuss any questions you have with your health care provider. Document Released: 12/29/2009 Document Revised: 01/14/2017 Document Reviewed: 01/14/2017 Elsevier Interactive Patient Education  2018 [employer].

## 2018-01-28 LAB — CBC WITH DIFFERENTIAL/PLATELET
Basophils Absolute: 98 cells/uL (ref 0–200)
Basophils Relative: 1.2 %
EOS ABS: 517 {cells}/uL — AB (ref 15–500)
EOS PCT: 6.3 %
HEMATOCRIT: 45.8 % (ref 38.5–50.0)
HEMOGLOBIN: 15.7 g/dL (ref 13.2–17.1)
LYMPHS ABS: 3247 {cells}/uL (ref 850–3900)
MCH: 29.5 pg (ref 27.0–33.0)
MCHC: 34.3 g/dL (ref 32.0–36.0)
MCV: 85.9 fL (ref 80.0–100.0)
MPV: 11.4 fL (ref 7.5–12.5)
Monocytes Relative: 9.1 %
NEUTROS ABS: 3592 {cells}/uL (ref 1500–7800)
NEUTROS PCT: 43.8 %
Platelets: 227 10*3/uL (ref 140–400)
RBC: 5.33 10*6/uL (ref 4.20–5.80)
RDW: 12.7 % (ref 11.0–15.0)
Total Lymphocyte: 39.6 %
WBC mixed population: 746 cells/uL (ref 200–950)
WBC: 8.2 10*3/uL (ref 3.8–10.8)

## 2018-01-28 LAB — BASIC METABOLIC PANEL
BUN: 22 mg/dL (ref 7–25)
CALCIUM: 10.3 mg/dL (ref 8.6–10.3)
CO2: 27 mmol/L (ref 20–32)
Chloride: 101 mmol/L (ref 98–110)
Creat: 1.24 mg/dL (ref 0.70–1.25)
Glucose, Bld: 156 mg/dL — ABNORMAL HIGH (ref 65–99)
POTASSIUM: 4.9 mmol/L (ref 3.5–5.3)
SODIUM: 140 mmol/L (ref 135–146)

## 2018-01-28 LAB — HEMOGLOBIN A1C
Hgb A1c MFr Bld: 8.7 % of total Hgb — ABNORMAL HIGH (ref <5.7)
MEAN PLASMA GLUCOSE: 203 (calc)
eAG (mmol/L): 11.2 (calc)

## 2018-01-30 ENCOUNTER — Telehealth: Payer: Self-pay | Admitting: Family Medicine

## 2018-01-30 NOTE — Telephone Encounter (Signed)
Pt calling to let us know his diabetic med is glimepiride 2mg 

## 2018-01-30 NOTE — Progress Notes (Signed)
Blood cell count, electrolytes, kidney function all good.   HbA1C is elevated since last labs - 8.7 (which means average sugars are over 200), will need to adjust medications.  Please schedule f/up appt with MBD (or myself) and bring medication list, or bring in all your diabetes medication that you are currently taking.   Start a blood sugar log.  Start with morning blood sugar before eating every day, and then take another reading at different times during the day, just note when it is (ie: evening before bed, before lunch, etc), and bring this with you to follow up appointment.

## 2018-01-30 NOTE — Telephone Encounter (Signed)
Is he still taking metformin and actos (pioglitazone)? Or any other medications for diabetes?

## 2018-01-30 NOTE — Telephone Encounter (Signed)
Spoke with patient and patient stated that he does still take the Metformin and the Actos as well. Those are the only 3 diabetic medications that he takes.

## 2018-01-31 ENCOUNTER — Other Ambulatory Visit: Payer: Self-pay | Admitting: Family Medicine

## 2018-02-02 ENCOUNTER — Ambulatory Visit (INDEPENDENT_AMBULATORY_CARE_PROVIDER_SITE_OTHER): Payer: Medicare Other | Admitting: Family Medicine

## 2018-02-02 ENCOUNTER — Other Ambulatory Visit: Payer: Self-pay

## 2018-02-02 VITALS — BP 122/70 | HR 72 | Temp 97.9°F

## 2018-02-02 DIAGNOSIS — J209 Acute bronchitis, unspecified: Secondary | ICD-10-CM

## 2018-02-02 DIAGNOSIS — E119 Type 2 diabetes mellitus without complications: Secondary | ICD-10-CM

## 2018-02-02 LAB — GLUCOSE 16585: GLUCOSE 2500: 197 mg/dL — AB (ref 65–99)

## 2018-02-02 MED ORDER — DULAGLUTIDE 1.5 MG/0.5ML ~~LOC~~ SOAJ: SUBCUTANEOUS | 3 refills | Status: DC

## 2018-02-02 NOTE — Patient Instructions (Addendum)
You will do the lower dose Trulicity shot next Thursday, April 25th. Higher dose shot (1.5) On May 2nd, then on May 9th.  Return for follow up with your sugar log in one month.  Continue all your other medication as prescribed.  Call me with your brand of sugar meter so I can send in Rx for strips.     Dulaglutide injection What is this medicine? DULAGLUTIDE (DOO la GLOO tide) is used to improve blood sugar control in adults with type 2 diabetes. This medicine may be used with other oral diabetes medicines. This medicine may be used for other purposes; ask your health care provider or pharmacist if you have questions. COMMON BRAND NAME(S): TRULICITY What should I tell my health care provider before I take this medicine? They need to know if you have any of these conditions: -endocrine tumors (MEN 2) or if someone in your family had these tumors -history of pancreatitis -kidney disease -liver disease -stomach problems -thyroid cancer or if someone in your family had thyroid cancer -an unusual or allergic reaction to dulaglutide, other medicines, foods, dyes, or preservatives -pregnant or trying to get pregnant -breast-feeding How should I use this medicine? This medicine is for injection under the skin of your upper leg (thigh), stomach area, or upper arm. It is usually given once every week (every 7 days). You will be taught how to prepare and give this medicine. Use exactly as directed. Take your medicine at regular intervals. Do not take it more often than directed. If you use this medicine with insulin, you should inject this medicine and the insulin separately. Do not mix them together. Do not give the injections right next to each other. Change (rotate) injection sites with each injection. It is important that you put your used needles and syringes in a special sharps container. Do not put them in a trash can. If you do not have a sharps container, call your pharmacist or  healthcare provider to get one. A special MedGuide will be given to you by the pharmacist with each prescription and refill. Be sure to read this information carefully each time. Talk to your pediatrician regarding the use of this medicine in children. Special care may be needed. Overdosage: If you think you have taken too much of this medicine contact a poison control center or emergency room at once. NOTE: This medicine is only for you. Do not share this medicine with others. What if I miss a dose? If you miss a dose, take it as soon as you can within 3 days after the missed dose. Then take your next dose at your regular weekly time. If it has been longer than 3 days after the missed dose, do not take the missed dose. Take the next dose at your regular time. Do not take double or extra doses. If you have questions about a missed dose, contact your health care provider for advice. What may interact with this medicine? -other medicines for diabetes Many medications may cause changes in blood sugar, these include: -alcohol containing beverages -antiviral medicines for HIV or AIDS -aspirin and aspirin-like drugs -certain medicines for blood pressure, heart disease, irregular heart beat -chromium -diuretics -male hormones, such as estrogens or progestins, birth control pills -fenofibrate -gemfibrozil -isoniazid -lanreotide -male hormones or anabolic steroids -MAOIs like Carbex, Eldepryl, Marplan, Nardil, and Parnate -medicines for weight loss -medicines for allergies, asthma, cold, or cough -medicines for depression, anxiety, or psychotic disturbances -niacin -nicotine -NSAIDs, medicines for pain and inflammation,  like ibuprofen or naproxen -octreotide -pasireotide -pentamidine -phenytoin -probenecid -quinolone antibiotics such as ciprofloxacin, levofloxacin, ofloxacin -some herbal dietary supplements -steroid medicines such as prednisone or cortisone -sulfamethoxazole;  trimethoprim -thyroid hormones Some medications can hide the warning symptoms of low blood sugar (hypoglycemia). You may need to monitor your blood sugar more closely if you are taking one of these medications. These include: -beta-blockers, often used for high blood pressure or heart problems (examples include atenolol, metoprolol, propranolol) -clonidine -guanethidine -reserpine This list may not describe all possible interactions. Give your health care provider a list of all the medicines, herbs, non-prescription drugs, or dietary supplements you use. Also tell them if you smoke, drink alcohol, or use illegal drugs. Some items may interact with your medicine. What should I watch for while using this medicine? Visit your doctor or health care professional for regular checks on your progress. Drink plenty of fluids while taking this medicine. Check with your doctor or health care professional if you get an attack of severe diarrhea, nausea, and vomiting. The loss of too much body fluid can make it dangerous for you to take this medicine. A test called the HbA1C (A1C) will be monitored. This is a simple blood test. It measures your blood sugar control over the last 2 to 3 months. You will receive this test every 3 to 6 months. Learn how to check your blood sugar. Learn the symptoms of low and high blood sugar and how to manage them. Always carry a quick-source of sugar with you in case you have symptoms of low blood sugar. Examples include hard sugar candy or glucose tablets. Make sure others know that you can choke if you eat or drink when you develop serious symptoms of low blood sugar, such as seizures or unconsciousness. They must get medical help at once. Tell your doctor or health care professional if you have high blood sugar. You might need to change the dose of your medicine. If you are sick or exercising more than usual, you might need to change the dose of your medicine. Do not skip meals.  Ask your doctor or health care professional if you should avoid alcohol. Many nonprescription cough and cold products contain sugar or alcohol. These can affect blood sugar. Pens should never be shared. Even if the needle is changed, sharing may result in passing of viruses like hepatitis or HIV. Wear a medical ID bracelet or chain, and carry a card that describes your disease and details of your medicine and dosage times. What side effects may I notice from receiving this medicine? Side effects that you should report to your doctor or health care professional as soon as possible: -allergic reactions like skin rash, itching or hives, swelling of the face, lips, or tongue -breathing problems -diarrhea that continues or is severe -lump or swelling on the neck -severe nausea -signs and symptoms of infection like fever or chills; cough; sore throat; pain or trouble passing urine -signs and symptoms of low blood sugar such as feeling anxious, confusion, dizziness, increased hunger, unusually weak or tired, sweating, shakiness, cold, irritable, headache, blurred vision, fast heartbeat, loss of consciousness -signs and symptoms of kidney injury like trouble passing urine or change in the amount of urine -trouble swallowing -unusual stomach upset or pain -vomiting Side effects that usually do not require medical attention (report to your doctor or health care professional if they continue or are bothersome): -diarrhea -loss of appetite -nausea -pain, redness, or irritation at site where injected -stomach upset  This list may not describe all possible side effects. Call your doctor for medical advice about side effects. You may report side effects to FDA at 1-800-FDA-1088. Where should I keep my medicine? Keep out of the reach of children. Store unopened pens in a refrigerator between 2 and 8 degrees C (36 and 46 degrees F). Do not freeze or use if the medicine has been frozen. Protect from light and  excessive heat. Store in the carton until use. Each single-dose pen can be kept at room temperature, not to exceed 30 degrees C (86 degrees F) for a total of 14 days, if needed. Throw away any unused medicine after the expiration date on the label. NOTE: This sheet is a summary. It may not cover all possible information. If you have questions about this medicine, talk to your doctor, pharmacist, or health care provider.  2018 Elsevier/Gold Standard (2016-10-21 14:35:01)    Blood Glucose Monitoring, Adult Monitoring your blood sugar (glucose) helps you manage your diabetes. It also helps you and your health care provider determine how well your diabetes management plan is working. Blood glucose monitoring involves checking your blood glucose as often as directed, and keeping a record (log) of your results over time. Why should I monitor my blood glucose? Checking your blood glucose regularly can:  Help you understand how food, exercise, illnesses, and medicines affect your blood glucose.  Let you know what your blood glucose is at any time. You can quickly tell if you are having low blood glucose (hypoglycemia) or high blood glucose (hyperglycemia).  Help you and your health care provider adjust your medicines as needed.  When should I check my blood glucose? Follow instructions from your health care provider about how often to check your blood glucose. This may depend on:  The type of diabetes you have.  How well-controlled your diabetes is.  Medicines you are taking.  If you have type 1 diabetes:  Check your blood glucose at least 2 times a day.  Also check your blood glucose: ? Before every insulin injection. ? Before and after exercise. ? Between meals. ? 2 hours after a meal. ? Occasionally between 2:00 a.m. and 3:00 a.m., as directed. ? Before potentially dangerous tasks, like driving or using heavy machinery. ? At bedtime.  You may need to check your blood glucose more  often, up to 6-10 times a day: ? If you use an insulin pump. ? If you need multiple daily injections (MDI). ? If your diabetes is not well-controlled. ? If you are ill. ? If you have a history of severe hypoglycemia. ? If you have a history of not knowing when your blood glucose is getting low (hypoglycemia unawareness). If you have type 2 diabetes:  If you take insulin or other diabetes medicines, check your blood glucose at least 2 times a day.  If you are on intensive insulin therapy, check your blood glucose at least 4 times a day. Occasionally, you may also need to check between 2:00 a.m. and 3:00 a.m., as directed.  Also check your blood glucose: ? Before and after exercise. ? Before potentially dangerous tasks, like driving or using heavy machinery.  You may need to check your blood glucose more often if: ? Your medicine is being adjusted. ? Your diabetes is not well-controlled. ? You are ill. What is a blood glucose log?  A blood glucose log is a record of your blood glucose readings. It helps you and your health care provider: ?  Look for patterns in your blood glucose over time. ? Adjust your diabetes management plan as needed.  Every time you check your blood glucose, write down your result and notes about things that may be affecting your blood glucose, such as your diet and exercise for the day.  Most glucose meters store a record of glucose readings in the meter. Some meters allow you to download your records to a computer. How do I check my blood glucose? Follow these steps to get accurate readings of your blood glucose: Supplies needed   Blood glucose meter.  Test strips for your meter. Each meter has its own strips. You must use the strips that come with your meter.  A needle to prick your finger (lancet). Do not use lancets more than once.  A device that holds the lancet (lancing device).  A journal or log book to write down your results. Procedure  Wash  your hands with soap and water.  Prick the side of your finger (not the tip) with the lancet. Use a different finger each time.  Gently rub the finger until a small drop of blood appears.  Follow instructions that come with your meter for inserting the test strip, applying blood to the strip, and using your blood glucose meter.  Write down your result and any notes. Alternative testing sites  Some meters allow you to use areas of your body other than your finger (alternative sites) to test your blood.  If you think you may have hypoglycemia, or if you have hypoglycemia unawareness, do not use alternative sites. Use your finger instead.  Alternative sites may not be as accurate as the fingers, because blood flow is slower in these areas. This means that the result you get may be delayed, and it may be different from the result that you would get from your finger.  The most common alternative sites are: ? Forearm. ? Thigh. ? Palm of the hand. Additional tips  Always keep your supplies with you.  If you have questions or need help, all blood glucose meters have a 24-hour "hotline" number that you can call. You may also contact your health care provider.  After you use a few boxes of test strips, adjust (calibrate) your blood glucose meter by following instructions that came with your meter. This information is not intended to replace advice given to you by your health care provider. Make sure you discuss any questions you have with your health care provider. Document Released: 10/07/2003 Document Revised: 04/23/2016 Document Reviewed: 03/15/2016 Elsevier Interactive Patient Education  2017 [employer].

## 2018-02-06 ENCOUNTER — Other Ambulatory Visit: Payer: Self-pay | Admitting: *Deleted

## 2018-02-06 MED ORDER — BLOOD GLUCOSE TEST VI STRP: ORAL_STRIP | 1 refills | Status: DC

## 2018-02-08 ENCOUNTER — Encounter: Payer: Self-pay | Admitting: Family Medicine

## 2018-02-08 NOTE — Assessment & Plan Note (Signed)
01/27/18 - A1C 8.7, increased from last available in chart On metformin, amaryl, jardiace Not taking actos - meds adjusted in chart Add trulicity injections, first done in clinic - 0.75, one month of injections given as samples to pt, and Rx sent to his pharmacy. He was instructed to bring in a sugar log but he did not.  On statin, ACEI Repeat labs in 3 months

## 2018-02-08 NOTE — Progress Notes (Signed)
Patient ID: Keith Schwartz, male    DOB: 17-Apr-1951, 67 y.o.   MRN: [id number]  PCP: Dorena Bodo, PA-C  Chief Complaint  Patient presents with  . Diabetes    Subjective:    Keith Schwartz is a 67 y.o. male who presents for recheck of respiratory infection, bronchitis, treatment started on January 27, 2018 with breathing treatments, antibiotics and lower dose steroid burst.  He does continue to cough however it is very mild, occasionally productive, no hemoptysis, chest tightness, chest pain, wheeze or shortness of breath.  Fever and sweats have resolved.  He has more energy.  URI symptoms have resolved, he denies any nausea, vomiting, diarrhea, abdominal pain, back pain, headaches.  Nivan is also brought in all of his medications from home today because he was unsure of which medications he was taking and they did not seem to match up with medications which we have in our chart here.  He states "the [place] to sends them and I just take him."  When seen 6 days ago he complained of weight loss, polyuria, polydipsia for at least 2 months.  He believes the [place] might have sent him new medications in the last 2 months but possibly the last 6 months.  He was asked to bring in a record of fasting sugars in the morning and then an additional 1 throughout the day for the past 6 days and he did not bring in any.  Has had no change to his symptoms.  He did go to the [place] yesterday and had a foot exam, he apparently did not talk to them about any of his diabetes medications.  He is taking metformin, Amaryl and Jardiance.  He is not taking Actos, but he does not know how long ago he stopped the medication.    Visit from 01/27/18: Keith Schwartz is a 67 y.o. male, with PMH of gout, diabetes, hyperlipidemia, hypertension has multiple complaints including 1 week of cough, a tick bite, elevated blood sugars, and weight loss.   He complains of 1 week of worsening productive cough with yellow to green sputum,  with associated fatigue, sweats and subjective fever   His cough is productive with green and gray sputum.  He reports hearing bubbling and crackling in between his breaths.  He is getting more tired with exertion but he denies any exertional dyspnea.  Denies hemoptysis, wheeze, chest pain.  He denies any smoking history.  While serving in [employer] he may have had a asbestos exposure however he is never been managed for this or workup for this as far as he knows, at the [place].  No history of recurrent bronchitis is, childhood asthma.  Does state that he is got pneumonia several times.  He has been sick for 7 days and 3 or 4 days ago started to feel a little bit better but he had a colonoscopy procedure and after doing the prep and having the procedure he severely worsened yesterday and today.  He also complains of a tick bite to his low central abdomen, which she states he pulled off himself a few days ago, is noted to be very small in size.  Now has a red area around it.    He also complains of at least 27 pound weight loss, unintentional over the past several months.  He is unsure of his medication changes for his diabetes, and did not bring an updated med list today.  But he believes 1  of his medications was changed for diabetes and since then he has been urinating more frequently.  After fasting and doing bowel prep for the colonoscopy, he had fasted for over 24 hours and yesterday morning his blood sugar was 180 when he was going in for the colonoscopy procedure.  He endorses nocturia, polyuria.  He does not check his blood sugar at home at all because it hurts.  He denies CP, LE edema, near syncope, palpitations.    Patient Active Problem List   Diagnosis Date Noted  . Gout 05/16/2017  . Hyperlipemia 10/27/2015  . Erectile dysfunction 10/27/2015  . Testosterone deficiency 10/27/2015  . Diabetes mellitus without complication (HCC)   . Hypertension      Prior to Admission medications     Medication Sig Start Date End Date Taking? Authorizing Provider  allopurinol (ZYLOPRIM) 300 MG tablet Take 1 tablet (300 mg total) by mouth daily. 05/24/17  Yes Dorena Bodoixon, Mary B, PA-C  aspirin 325 MG tablet Take 325 mg by mouth daily.   Yes [provider]  atorvastatin (LIPITOR) 80 MG tablet TAKE ONE-HALF (1/2) TABLET DAILY 12/02/16  Yes Donita BrooksPickard, Warren T, MD  glimepiride (AMARYL) 2 MG tablet  12/28/17  Yes [provider]  hydrOXYzine (ATARAX/VISTARIL) 25 MG tablet Take 1 tablet (25 mg total) by mouth 3 (three) times daily as needed. 11/24/15  Yes Donita BrooksPickard, Warren T, MD  lisinopril (PRINIVIL,ZESTRIL) 20 MG tablet TAKE ONE-HALF (1/2) TABLET DAILY 10/14/16  Yes Donita BrooksPickard, Warren T, MD  metFORMIN (GLUCOPHAGE) 1000 MG tablet TAKE 1 TABLET TWICE A DAY WITH MEALS 12/06/16  Yes Dorena Bodoixon, Mary B, PA-C  Multiple Vitamin (MULTIVITAMIN) tablet Take 1 tablet by mouth daily. MEN'S ONE A DAY 50+   Yes [provider]  pioglitazone (ACTOS) 45 MG tablet TAKE 1 TABLET DAILY 12/06/16  Yes Allayne Butcherixon, Mary B, PA-C  vitamin E 400 UNIT capsule Take 400 Units by mouth daily.   Yes [provider]  colchicine 0.6 MG tablet At sign of gout flare: Take 2. One hour later--Take 1. Then, take 1 twice a day until flare resolves Patient not taking: Reported on 01/27/2018 02/10/17   Dorena Bodoixon, Mary B, PA-C  diclofenac (VOLTAREN) 50 MG EC tablet Take 1 tablet (50 mg total) by mouth 3 (three) times daily. Patient not taking: Reported on 01/27/2018 02/03/17   Allayne Butcherixon, Mary B, PA-C  glyBURIDE (DIABETA) 5 MG tablet TAKE 2 TABLETS TWICE A DAY WITH MEALS Patient not taking: Reported on 01/27/2018 12/06/16   Dorena Bodoixon, Mary B, PA-C  promethazine (PHENERGAN) 25 MG tablet Take 1 tablet (25 mg total) by mouth every 6 (six) hours as needed for nausea or vomiting. Patient not taking: Reported on 01/27/2018 01/04/18   Allayne Butcherixon, Mary B, PA-C  sildenafil (VIAGRA) 100 MG tablet Take 0.5 tablets (50 mg total) by mouth daily as needed for erectile  dysfunction. Reported on 10/27/2015 Patient not taking: Reported on 01/27/2018 06/18/16   Allayne Butcherixon, Mary B, PA-C  testosterone cypionate (DEPO-TESTOSTERONE) 200 MG/ML injection Inject 1 mL (200 mg total) into the muscle every 14 (fourteen) days. Patient not taking: Reported on 01/27/2018 05/16/17   Allayne Butcherixon, Mary B, PA-C  vardenafil (LEVITRA) 20 MG tablet Take 20 mg by mouth. Reported on 10/27/2015    [provider]     Allergies  Allergen Reactions  . Penicillins      Family History  Problem Relation Age of Onset  . Arthritis Mother   . Asthma Mother   . Hearing loss Mother   .  Vision loss Mother   . Heart disease Father   . Hyperlipidemia Father   . Hypertension Father   . Early death Sister      Social History   Socioeconomic History  . Marital status: Married    Spouse name: Not on file  . Number of children: Not on file  . Years of education: Not on file  . Highest education level: Not on file  Occupational History  . Not on file  Social Needs  . Financial resource strain: Not on file  . Food insecurity:    Worry: Not on file    Inability: Not on file  . Transportation needs:    Medical: Not on file    Non-medical: Not on file  Tobacco Use  . Smoking status: Never Smoker  . Smokeless tobacco: Never Used  Substance and Sexual Activity  . Alcohol use: Yes    Alcohol/week: 0.6 oz    Types: 1 Cans of beer per week  . Drug use: Not on file  . Sexual activity: Yes    Birth control/protection: Surgical  Lifestyle  . Physical activity:    Days per week: Not on file    Minutes per session: Not on file  . Stress: Not on file  Relationships  . Social connections:    Talks on phone: Not on file    Gets together: Not on file    Attends religious service: Not on file    Active member of club or organization: Not on file    Attends meetings of clubs or organizations: Not on file    Relationship status: Not on file  . Intimate partner violence:    Fear of current  or ex partner: Not on file    Emotionally abused: Not on file    Physically abused: Not on file    Forced sexual activity: Not on file  Other Topics Concern  . Not on file  Social History Narrative  . Not on file     Review of Systems  Constitutional: Negative.  Negative for activity change, appetite change, chills, diaphoresis, fatigue, fever and unexpected weight change.  HENT: Negative.   Eyes: Negative.   Respiratory: Positive for cough. Negative for apnea, choking, chest tightness, shortness of breath, wheezing and stridor.   Cardiovascular: Negative.  Negative for chest pain, palpitations and leg swelling.  Gastrointestinal: Negative.  Negative for abdominal pain, diarrhea, nausea and vomiting.  Endocrine: Positive for polydipsia and polyuria. Negative for cold intolerance and heat intolerance.  Genitourinary: Positive for frequency. Negative for decreased urine volume, difficulty urinating, dysuria, enuresis, flank pain, hematuria and urgency.  Musculoskeletal: Negative.  Negative for arthralgias and myalgias.  Skin: Negative.  Negative for color change, pallor and rash.  Neurological: Negative.  Negative for dizziness, syncope, weakness, light-headedness, numbness and headaches.  Hematological: Negative.   Psychiatric/Behavioral: Negative.   All other systems reviewed and are negative.      Objective:      Vitals:   02/02/18 0937  BP: 122/70  Pulse: 72  Temp: 97.9 F (36.6 C)  TempSrc: Oral  SpO2: 97%     Physical Exam  Constitutional: He is oriented to person, place, and time. He appears well-developed and well-nourished. No distress.  HENT:  Head: Normocephalic and atraumatic.  Right Ear: External ear normal.  Left Ear: External ear normal.  Nose: Nose normal.  Mouth/Throat: Oropharynx is clear and moist. No oropharyngeal exudate.  Eyes: Pupils are equal, round, and reactive to light. Conjunctivae  are normal. Right eye exhibits no discharge. Left eye  exhibits no discharge. No scleral icterus.  Neck: Normal range of motion. Neck supple. No JVD present. No tracheal deviation present.  Cardiovascular: Normal rate, regular rhythm, normal heart sounds and intact distal pulses. Exam reveals no gallop and no friction rub.  No murmur heard. Pulmonary/Chest: Effort normal and breath sounds normal. No stridor. No respiratory distress. He has no wheezes. He has no rales. He exhibits no tenderness.  Abdominal: Soft. Bowel sounds are normal. He exhibits no distension. There is no tenderness. There is no rebound and no guarding.  Musculoskeletal: Normal range of motion.  Lymphadenopathy:    He has no cervical adenopathy.  Neurological: He is alert and oriented to person, place, and time. He exhibits normal muscle tone. Coordination normal.  Skin: Skin is warm and dry. Capillary refill takes less than 2 seconds. No rash noted. He is not diaphoretic. No pallor.  Psychiatric: He has a normal mood and affect. His behavior is normal. Judgment normal.  Nursing note and vitals reviewed.    Recent Results (from the past 2160 hour(s))  Influenza A and B Ag, Immunoassay     Status: None   Collection Time: 01/04/18  3:56 PM  Result Value Ref Range   Source: NASOPHARYNX    INFLUENZA A ANTIGEN NOT DETECTED NOT DETECT   INFLUENZA B ANTIGEN NOT DETECTED NOT DETECT   COMMENT:      Comment: The sensitivity of this direct antigen immunoassay is < or equal to 70% for Influenza A compared to culture and may be lower for pandemic H1N1 Influenza than for seasonal Influenza A viruses. This may also hold true for Influenza B. Therefore, a negative result does not exclude Influenza virus infection. If clinically indicated, consider ordering test 35945, Culture, Influenza A and B, Rapid Method or test 16086, Influenza virus A/B RNA, QL Real Time RT PCR.   CBC with Differential/Platelet     Status: Abnormal   Collection Time: 01/27/18  3:19 PM  Result Value Ref Range     WBC 8.2 3.8 - 10.8 Thousand/uL   RBC 5.33 4.20 - 5.80 Million/uL   Hemoglobin 15.7 13.2 - 17.1 g/dL   HCT 40.9 81.1 - 91.4 %   MCV 85.9 80.0 - 100.0 fL   MCH 29.5 27.0 - 33.0 pg   MCHC 34.3 32.0 - 36.0 g/dL   RDW 78.2 95.6 - 21.3 %   Platelets 227 140 - 400 Thousand/uL   MPV 11.4 7.5 - 12.5 fL   Neutro Abs 3,592 1,500 - 7,800 cells/uL   Lymphs Abs 3,247 850 - 3,900 cells/uL   WBC mixed population 746 200 - 950 cells/uL   Eosinophils Absolute 517 (H) 15 - 500 cells/uL   Basophils Absolute 98 0 - 200 cells/uL   Neutrophils Relative % 43.8 %   Total Lymphocyte 39.6 %   Monocytes Relative 9.1 %   Eosinophils Relative 6.3 %   Basophils Relative 1.2 %  Basic Metabolic Panel     Status: Abnormal   Collection Time: 01/27/18  3:19 PM  Result Value Ref Range   Glucose, Bld 156 (H) 65 - 99 mg/dL    Comment: .            Fasting reference interval . For someone without known diabetes, a glucose value >125 mg/dL indicates that they may have diabetes and this should be confirmed with a follow-up test. .    BUN 22 7 - 25 mg/dL  Creat 1.24 0.70 - 1.25 mg/dL    Comment: For patients >25 years of age, the reference limit for Creatinine is approximately 13% higher for people identified as African-American. .    BUN/Creatinine Ratio NOT APPLICABLE 6 - 22 (calc)   Sodium 140 135 - 146 mmol/L   Potassium 4.9 3.5 - 5.3 mmol/L   Chloride 101 98 - 110 mmol/L   CO2 27 20 - 32 mmol/L   Calcium 10.3 8.6 - 10.3 mg/dL  Hemoglobin Z6X     Status: Abnormal   Collection Time: 01/27/18  3:19 PM  Result Value Ref Range   Hgb A1c MFr Bld 8.7 (H) <5.7 % of total Hgb    Comment: For someone without known diabetes, a hemoglobin A1c value of 6.5% or greater indicates that they may have  diabetes and this should be confirmed with a follow-up  test. . For someone with known diabetes, a value <7% indicates  that their diabetes is well controlled and a value  greater than or equal to 7% indicates  suboptimal  control. A1c targets should be individualized based on  duration of diabetes, age, comorbid conditions, and  other considerations. . Currently, no consensus exists regarding use of hemoglobin A1c for diagnosis of diabetes for children. .    Mean Plasma Glucose 203 (calc)   eAG (mmol/L) 11.2 (calc)  Urinalysis, Routine w reflex microscopic     Status: Abnormal   Collection Time: 01/27/18  3:26 PM  Result Value Ref Range   Color, Urine YELLOW YELLOW   APPearance CLEAR CLEAR   Specific Gravity, Urine 1.015 1.001 - 1.03   pH 5.5 5.0 - 8.0   Glucose, UA 2+ (A) NEGATIVE   Bilirubin Urine NEGATIVE NEGATIVE   Ketones, ur TRACE (A) NEGATIVE   Hgb urine dipstick NEGATIVE NEGATIVE   Protein, ur NEGATIVE NEGATIVE   Nitrite NEGATIVE NEGATIVE   Leukocytes, UA NEGATIVE NEGATIVE  GLUCOSE 09604     Status: Abnormal   Collection Time: 02/02/18  9:40 AM  Result Value Ref Range   Glucose 197 (H) 65 - 99 mg/dL    Comment: . Fasting Reference Interval . Point of care fingerstick glucose results may vary from venous glucose methodologies. Any results exhibiting inconsistency with the patient's clinical status should be repeated using a venous glucose method. .         Assessment & Plan:   Problem List Items Addressed This Visit      Endocrine   Diabetes mellitus without complication (HCC) - Primary    01/27/18 - A1C 8.7, increased from last available in chart On metformin, amaryl, jardiace Not taking actos - meds adjusted in chart Add trulicity injections, first done in clinic - 0.75, one month of injections given as samples to pt, and Rx sent to his pharmacy. He was instructed to bring in a sugar log but he did not.  On statin, ACEI Repeat labs in 3 months      Relevant Medications   empagliflozin (JARDIANCE) 10 MG TABS tablet   Dulaglutide (TRULICITY) 1.5 MG/0.5ML SOPN   Other Relevant Orders   Glucose, fingerstick (stat)   GLUCOSE 54098 (Completed)    Other  Visit Diagnoses    Acute bronchitis, unspecified organism          Recheck of acute bronchitis, lungs are clear to auscultation anteriorly and posteriorly, no distress, vital signs stable.  Patient improved.  Fasting CBG here continues to be elevated, 197 Labs reviewed show stable kidney function, creatinine 1.24  Urinalysis showed glucosuria, no with his known medications this makes sense with labs and A1C.  Discussed with the patient Trulicity injection, mechanism of action of all of his diabetes medications.  Patient was also educated by Wynona Canes Six about administration of the injection.  Information was printed and given to him as well.  Side effects were reviewed.  Also discussed the lower starting dose for 2 weeks and increasing to 1.5 mg dose for 2 weeks, after which he knows he has to go to the pharmacy to get the injections.  Offered to have the patient come in to clinic to get shot administered if he would like assistance with a nurse visit.  The dates of injections, every Thursday, written down for him.  He is getting a sugar log to fill out.  Instructed the patient to start checking his sugars regularly, especially morning fasting blood sugar which we need to get down.  Because he is failed on triple therapy, Trulicity is indicated and/or insulin however the patient with a history of noncompliance and poor understanding of his management of diabetes, did not wish to start insulin at this time and will try Trulicity first.  Discussed patient's lab work, presentation and medication list with Dr. Tanya Nones was available to review with me and he agrees with treatment and continuing his 3 other medications as prescribed.  Danelle Berry, PA-C 02/08/18 11:34 AM

## 2018-03-02 ENCOUNTER — Other Ambulatory Visit: Payer: Self-pay

## 2018-03-02 ENCOUNTER — Ambulatory Visit (INDEPENDENT_AMBULATORY_CARE_PROVIDER_SITE_OTHER): Payer: Medicare Other | Admitting: Physician Assistant

## 2018-03-02 ENCOUNTER — Encounter: Payer: Self-pay | Admitting: Physician Assistant

## 2018-03-02 VITALS — BP 122/80 | HR 68 | Temp 97.5°F | Resp 18 | Ht 76.0 in | Wt 254.6 lb

## 2018-03-02 DIAGNOSIS — E119 Type 2 diabetes mellitus without complications: Secondary | ICD-10-CM | POA: Diagnosis not present

## 2018-03-02 MED ORDER — DULAGLUTIDE 1.5 MG/0.5ML ~~LOC~~ SOAJ: SUBCUTANEOUS | 3 refills | Status: DC

## 2018-03-02 NOTE — Progress Notes (Signed)
Patient ID: LEMARCUS Schwartz MRN: [id number], DOB: Feb 16, 1951, 67 y.o. Date of Encounter: 03/02/2018, 12:21 PM    Chief Complaint:  Chief Complaint  Patient presents with  . discuss A1C     HPI: 67 y.o. year old male presents with above.    Today I have reviewed his recent office notes with Keith Berry, PA.  He recently had visit with her on 01/27/2018 and 02/02/2018.  His recent visits were secondary to respiratory infection/bronchitis.  At those visits --was also noted that he was uncertain of exactly what medicines he was taking and that the medicines he thought he was taking did not necessarily match up with our medicine list in our computer.  He also goes to the [place] and is prescribed medications from the [place].  Today I also reviewed some of his prior visits with me.  I specifically reviewed his initial visit with me when he saw me to establish care here at this practice.  At that visit he reported that he had been going to the [place] and would continue to go to the [place] for his chronic medical problems and just wanted to have [place] available to come to if he needed a visit for an acute medical problem.  Today I reviewed all of this information with him.  He states that he does want to start having his diabetes managed here at this office rather than the [place].  Today I also reviewed that at his last visit with Leisa on 02/02/2018 Trulicity was added. The plan was for him to have a follow up visit in 2 weeks to review blood sugars and for him to bring in his current home medication bottles to make sure that this was consistent with our medication list in our computer system.  Today he reports that he has added Trulicity and is giving this injection weekly. Today he reports that he has not been checking his blood sugar.  He brings in absolutely no blood sugar log or meter with readings to review.  States that he "has been too busy ".  Has not been checking blood sugar at all. Also he does not bring  in any type of medication bottles with him today at all.    Home Meds:   Outpatient Medications Prior to Visit  Medication Sig Dispense Refill  . albuterol (PROVENTIL HFA;VENTOLIN HFA) 108 (90 Base) MCG/ACT inhaler Inhale 2 puffs into the lungs every 6 (six) hours as needed for wheezing or shortness of breath. 1 Inhaler 0  . allopurinol (ZYLOPRIM) 300 MG tablet Take 1 tablet (300 mg total) by mouth daily. 30 tablet 5  . aspirin 325 MG tablet Take 325 mg by mouth daily.    Marland Kitchen atorvastatin (LIPITOR) 80 MG tablet TAKE ONE-HALF (1/2) TABLET DAILY 90 tablet 1  . colchicine 0.6 MG tablet At sign of gout flare: Take 2. One hour later--Take 1. Then, take 1 twice a day until flare resolves 30 tablet 2  . diclofenac (VOLTAREN) 50 MG EC tablet Take 1 tablet (50 mg total) by mouth 3 (three) times daily. 90 tablet 0  . empagliflozin (JARDIANCE) 10 MG TABS tablet Take 10 mg by mouth daily.    Marland Kitchen glimepiride (AMARYL) 2 MG tablet     . Glucose Blood (BLOOD GLUCOSE TEST STRIPS) STRP Please dispense based on patient and insurance preference. Use as directed to monitor FSBS 3x daily. Dx: Fletcher.Ros. 200 each 1  . hydrOXYzine (ATARAX/VISTARIL) 25 MG tablet Take 1 tablet (25  mg total) by mouth 3 (three) times daily as needed. 90 tablet 1  . lisinopril (PRINIVIL,ZESTRIL) 20 MG tablet TAKE ONE-HALF (1/2) TABLET DAILY 90 tablet 1  . metFORMIN (GLUCOPHAGE) 1000 MG tablet TAKE 1 TABLET TWICE A DAY WITH MEALS 60 tablet 0  . Multiple Vitamin (MULTIVITAMIN) tablet Take 1 tablet by mouth daily. MEN'S ONE A DAY 50+    . sildenafil (VIAGRA) 100 MG tablet Take 0.5 tablets (50 mg total) by mouth daily as needed for erectile dysfunction. Reported on 10/27/2015 10 tablet 11  . Specialty Vitamins Products (MAGNESIUM, AMINO ACID CHELATE,) 133 MG tablet Take 1 tablet by mouth 2 (two) times daily.    Marland Kitchen testosterone cypionate (DEPO-TESTOSTERONE) 200 MG/ML injection Inject 1 mL (200 mg total) into the muscle every 14 (fourteen) days. 10 mL 0    . vardenafil (LEVITRA) 20 MG tablet Take 20 mg by mouth. Reported on 10/27/2015    . vitamin E 400 UNIT capsule Take 400 Units by mouth daily.    Marland Kitchen zinc gluconate 50 MG tablet Take 50 mg by mouth daily.    . benzonatate (TESSALON) 100 MG capsule Take 1 capsule (100 mg total) by mouth 3 (three) times daily as needed for cough. 30 capsule 0  . doxycycline (VIBRA-TABS) 100 MG tablet Take 1 tablet (100 mg total) by mouth 2 (two) times daily. 20 tablet 0  . Dulaglutide (TRULICITY) 1.5 MG/0.5ML SOPN One subcutaneous injection once a week 4 pen 3  . predniSONE (DELTASONE) 20 MG tablet Take 2 tabs PO qd x 2 d, then take 1 tab PO q d x 2 d 6 tablet 0  . promethazine (PHENERGAN) 25 MG tablet Take 1 tablet (25 mg total) by mouth every 6 (six) hours as needed for nausea or vomiting. 30 tablet 0   No facility-administered medications prior to visit.     Allergies:  Allergies  Allergen Reactions  . Penicillins       Review of Systems: See HPI for pertinent ROS. All other ROS negative.    Physical Exam: Blood pressure 122/80, pulse 68, temperature (!) 97.5 F (36.4 C), temperature source Oral, resp. rate 18, height  (1.93 m), weight 115.5 kg (254 lb 9.6 oz), SpO2 98 %., Body mass index is 30.99 kg/m. General: WNWD WM.  Appears in no acute distress. Neck: Supple. No thyromegaly. No lymphadenopathy. Lungs: Clear bilaterally to auscultation without wheezes, rales, or rhonchi. Breathing is unlabored. Heart: Regular rhythm. No murmurs, rubs, or gallops. Abdomen: Soft, non-tender, non-distended with normoactive bowel sounds. No hepatomegaly. No rebound/guarding. No obvious abdominal masses. Msk:  Strength and tone normal for age. Extremities/Skin: Warm and dry.  No edema. Neuro: Alert and oriented X 3. Moves all extremities spontaneously. Gait is normal. CNII-XII grossly in tact. Psych:  Responds to questions appropriately with a normal affect.     ASSESSMENT AND PLAN:  67 y.o. year old male  with  1. Diabetes mellitus without complication [facility]) Wife accompanies him for visit.  This following information is reviewed with both of them.  They are to go home and review his medication bottles that he is actively currently taking and call our office and update our medication list.  Today I have printed out our current medication list for them to take home and compared to his current medication bottles that are at home.  He is to start checking fasting blood sugar readings and document them and call me with these readings in 1 week.     Signed,  [address] [place], [place], [facility] 03/02/2018 12:21 PM

## 2018-03-03 ENCOUNTER — Ambulatory Visit: Payer: Medicare Other | Admitting: Family Medicine

## 2018-03-14 ENCOUNTER — Ambulatory Visit (HOSPITAL_COMMUNITY)
Admission: EM | Admit: 2018-03-14 | Discharge: 2018-03-14 | Disposition: A | Discharge status: Home / Self Care | Payer: Medicare Other

## 2018-05-12 ENCOUNTER — Other Ambulatory Visit: Payer: Self-pay

## 2018-05-12 DIAGNOSIS — E119 Type 2 diabetes mellitus without complications: Secondary | ICD-10-CM

## 2018-05-12 MED ORDER — DULAGLUTIDE 1.5 MG/0.5ML ~~LOC~~ SOAJ: SUBCUTANEOUS | 3 refills | Status: DC

## 2018-06-05 ENCOUNTER — Other Ambulatory Visit: Payer: Self-pay | Admitting: Physician Assistant

## 2018-06-05 DIAGNOSIS — E119 Type 2 diabetes mellitus without complications: Secondary | ICD-10-CM

## 2018-07-06 DIAGNOSIS — Z23 Encounter for immunization: Secondary | ICD-10-CM | POA: Diagnosis not present

## 2018-07-26 ENCOUNTER — Other Ambulatory Visit: Payer: Self-pay | Admitting: Physician Assistant

## 2018-07-26 DIAGNOSIS — E119 Type 2 diabetes mellitus without complications: Secondary | ICD-10-CM

## 2018-09-26 DIAGNOSIS — M13841 Other specified arthritis, right hand: Secondary | ICD-10-CM | POA: Diagnosis not present

## 2018-11-01 DIAGNOSIS — M13841 Other specified arthritis, right hand: Secondary | ICD-10-CM | POA: Diagnosis not present

## 2018-11-16 DIAGNOSIS — Z5189 Encounter for other specified aftercare: Secondary | ICD-10-CM | POA: Diagnosis not present

## 2018-11-16 DIAGNOSIS — M13841 Other specified arthritis, right hand: Secondary | ICD-10-CM | POA: Diagnosis not present

## 2019-05-18 ENCOUNTER — Other Ambulatory Visit: Payer: Self-pay

## 2019-07-05 ENCOUNTER — Ambulatory Visit (INDEPENDENT_AMBULATORY_CARE_PROVIDER_SITE_OTHER): Payer: Medicare Other | Admitting: Family Medicine

## 2019-07-05 ENCOUNTER — Encounter: Payer: Self-pay | Admitting: Family Medicine

## 2019-07-05 ENCOUNTER — Other Ambulatory Visit: Payer: Self-pay

## 2019-07-05 VITALS — BP 136/68 | HR 66 | Temp 98.7°F | Resp 16 | Ht 75.5 in | Wt 263.0 lb

## 2019-07-05 DIAGNOSIS — L255 Unspecified contact dermatitis due to plants, except food: Secondary | ICD-10-CM

## 2019-07-05 DIAGNOSIS — E119 Type 2 diabetes mellitus without complications: Secondary | ICD-10-CM | POA: Diagnosis not present

## 2019-07-05 MED ORDER — MOMETASONE FUROATE 0.1 % EX CREA: 1.0000 "application " | TOPICAL_CREAM | Freq: Three times a day (TID) | CUTANEOUS | 1 refills | Status: DC | PRN

## 2019-07-05 NOTE — Progress Notes (Signed)
Subjective:    Patient ID: Keith Schwartz, male    DOB: June 13, 1951, 68 y.o.   MRN: [id number]  HPI Patient is a 68 year old male who was previously seen my partner who has since retired.  Patient has not been seen in this office in more than a year.  He has a history of type 2 diabetes mellitus that is poorly controlled.  Last hemoglobin A1c was well above 8.  He presents today complaining of "poison oak".  Rash has been present now since last Friday.  Therefore has had an approximately 1 week.  Please see the photograph below.  Patient has similar lesions around his waist and 2 other spots on his chest.  One spot is located above his left nipple.  The other spot is located on his left ribs.  The pitcher demonstrates the worst patch.   Past Medical History:  Diagnosis Date  . Diabetes mellitus without complication ([place]) 3953  . Hypertension 2002   Past Surgical History:  Procedure Laterality Date  . APPENDECTOMY  1970  . JOINT REPLACEMENT  2012   rt knee  . VASECTOMY  1986   Current Outpatient Medications on File Prior to Visit  Medication Sig Dispense Refill  . albuterol (PROVENTIL HFA;VENTOLIN HFA) 108 (90 Base) MCG/ACT inhaler Inhale 2 puffs into the lungs every 6 (six) hours as needed for wheezing or shortness of breath. 1 Inhaler 0  . allopurinol (ZYLOPRIM) 300 MG tablet Take 1 tablet (300 mg total) by mouth daily. 30 tablet 5  . aspirin 325 MG tablet Take 325 mg by mouth daily.    Marland Kitchen atorvastatin (LIPITOR) 80 MG tablet TAKE ONE-HALF (1/2) TABLET DAILY 90 tablet 1  . colchicine 0.6 MG tablet At sign of gout flare: Take 2. One hour later--Take 1. Then, take 1 twice a day until flare resolves 30 tablet 2  . diclofenac (VOLTAREN) 50 MG EC tablet Take 1 tablet (50 mg total) by mouth 3 (three) times daily. 90 tablet 0  . glimepiride (AMARYL) 2 MG tablet     . Glucose Blood (BLOOD GLUCOSE TEST STRIPS) STRP Please dispense based on patient and insurance preference. Use as directed to  monitor FSBS 3x daily. Dx: [place].Dukes. 200 each 1  . hydrOXYzine (ATARAX/VISTARIL) 25 MG tablet Take 1 tablet (25 mg total) by mouth 3 (three) times daily as needed. 90 tablet 1  . lisinopril (PRINIVIL,ZESTRIL) 20 MG tablet TAKE ONE-HALF (1/2) TABLET DAILY 90 tablet 1  . metFORMIN (GLUCOPHAGE) 1000 MG tablet TAKE 1 TABLET TWICE A DAY WITH MEALS 60 tablet 0  . metFORMIN (GLUCOPHAGE) 1000 MG tablet TAKE ONE TABLET BY MOUTH TWICE DAILY WITH  A  MEAL 180 tablet 0  . Multiple Vitamin (MULTIVITAMIN) tablet Take 1 tablet by mouth daily. MEN'S ONE A DAY 50+    . sildenafil (VIAGRA) 100 MG tablet Take 0.5 tablets (50 mg total) by mouth daily as needed for erectile dysfunction. Reported on 10/27/2015 10 tablet 11  . Specialty Vitamins Products (MAGNESIUM, AMINO ACID CHELATE,) 133 MG tablet Take 1 tablet by mouth 2 (two) times daily.    Marland Kitchen testosterone cypionate (DEPO-TESTOSTERONE) 200 MG/ML injection Inject 1 mL (200 mg total) into the muscle every 14 (fourteen) days. 10 mL 0  . TRULICITY 1.5 UY/2.3XI SOPN INJECT 1 PEN UNDER THE SKIN ONCE A WEEK 6 mL 4  . vardenafil (LEVITRA) 20 MG tablet Take 20 mg by mouth. Reported on 10/27/2015    . vitamin E 400 UNIT capsule Take 400  Units by mouth daily.    Marland Kitchen zinc gluconate 50 MG tablet Take 50 mg by mouth daily.     No current facility-administered medications on file prior to visit.    Allergies  Allergen Reactions  . Penicillins    Social History   Socioeconomic History  . Marital status: Married    Spouse name: Not on file  . Number of children: Not on file  . Years of education: Not on file  . Highest education level: Not on file  Occupational History  . Not on file  Social Needs  . Financial resource strain: Not on file  . Food insecurity    Worry: Not on file    Inability: Not on file  . Transportation needs    Medical: Not on file    Non-medical: Not on file  Tobacco Use  . Smoking status: Never Smoker  . Smokeless tobacco: Never Used  Substance  and Sexual Activity  . Alcohol use: Yes    Alcohol/week: 1.0 standard drinks    Types: 1 Cans of beer per week  . Drug use: Not on file  . Sexual activity: Yes    Birth control/protection: Surgical  Lifestyle  . Physical activity    Days per week: Not on file    Minutes per session: Not on file  . Stress: Not on file  Relationships  . Social Herbalist on phone: Not on file    Gets together: Not on file    Attends religious service: Not on file    Active member of club or organization: Not on file    Attends meetings of clubs or organizations: Not on file    Relationship status: Not on file  . Intimate partner violence    Fear of current or ex partner: Not on file    Emotionally abused: Not on file    Physically abused: Not on file    Forced sexual activity: Not on file  Other Topics Concern  . Not on file  Social History Narrative  . Not on file      Review of Systems  All other systems reviewed and are negative.      Objective:   Physical Exam Vitals signs reviewed.  Cardiovascular:     Rate and Rhythm: Normal rate and regular rhythm.  Pulmonary:     Effort: Pulmonary effort is normal.     Breath sounds: Normal breath sounds.  Skin:    Findings: Rash present.  Neurological:     Mental Status: He is alert.           Assessment & Plan:  Diabetes mellitus without complication ([place]) - Plan: Hemoglobin A1c, COMPLETE METABOLIC PANEL WITH GFR  Contact dermatitis due to plant  Although I am listed as the patient's PCP I have never met the patient before.  He is currently going to the [place] and receiving his diabetes care there.  His last hemoglobin A1c was just recently checked at the [facility] and therefore he declines labs here however it was greater than 8 at the [place].  I explained to the patient that using prednisone pills will exacerbate his glycemic control and I am concerned that this would cause potentially dangerous hyperglycemia.  Therefore we will try  Elocon cream 2-3 times a day applied to the affected areas as needed.  Rash should persist approximately 1 more week and then resolve gradually on its own.

## 2019-09-28 ENCOUNTER — Other Ambulatory Visit: Payer: Self-pay | Admitting: *Deleted

## 2019-09-28 DIAGNOSIS — E119 Type 2 diabetes mellitus without complications: Secondary | ICD-10-CM

## 2019-09-28 MED ORDER — TRULICITY 1.5 MG/0.5ML ~~LOC~~ SOAJ: SUBCUTANEOUS | 4 refills | Status: DC

## 2019-11-01 ENCOUNTER — Telehealth: Payer: Self-pay

## 2019-11-01 ENCOUNTER — Other Ambulatory Visit: Payer: Self-pay | Admitting: Family Medicine

## 2019-11-01 MED ORDER — SILDENAFIL CITRATE 100 MG PO TABS: 50.0000 mg | ORAL_TABLET | Freq: Every day | ORAL | 11 refills | Status: DC | PRN

## 2019-11-01 NOTE — Telephone Encounter (Signed)
Pt's wife called and stated that pt wants a Rx for erectile dysfunction (viagra). Please advise.

## 2019-11-01 NOTE — Telephone Encounter (Signed)
Viagra was sent to [facility]

## 2020-05-22 ENCOUNTER — Ambulatory Visit (INDEPENDENT_AMBULATORY_CARE_PROVIDER_SITE_OTHER): Payer: Medicare Other | Admitting: Family Medicine

## 2020-05-22 ENCOUNTER — Other Ambulatory Visit: Payer: Self-pay

## 2020-05-22 VITALS — BP 110/50 | HR 78 | Temp 97.9°F | Ht 75.0 in | Wt 252.0 lb

## 2020-05-22 DIAGNOSIS — B372 Candidiasis of skin and nail: Secondary | ICD-10-CM | POA: Diagnosis not present

## 2020-05-22 DIAGNOSIS — W57XXXA Bitten or stung by nonvenomous insect and other nonvenomous arthropods, initial encounter: Secondary | ICD-10-CM | POA: Diagnosis not present

## 2020-05-22 MED ORDER — CLOTRIMAZOLE-BETAMETHASONE 1-0.05 % EX CREA: 1.0000 "application " | TOPICAL_CREAM | Freq: Two times a day (BID) | CUTANEOUS | 2 refills | Status: DC

## 2020-05-22 NOTE — Progress Notes (Signed)
Subjective:    Patient ID: Keith Schwartz, male    DOB: 20-Oct-1950, 69 y.o.   MRN: [id number]  HPI Patient is a 69 year old Caucasian male with a history of diabetes mellitus.  He receives management of his diabetes through the [place].  He is here today for a rash.  He has a red serpiginous border rash in the crease between his thigh and his scrotum on both sides.  He has a similar erythematous patch with a serpiginous border in the gluteal cleft spreading onto both butt cheeks.  The rash itches.  It is made worse by heat and sweating.  He states he gets this every year in the summertime.  He denies any fevers or chills.  He also requests testing for Lyme disease.  He states that he works outside constantly and he has been bitten by numerous ticks this summer.  He denies any erythema migrans.  He denies any symptoms of secondary Lyme disease. Past Medical History:  Diagnosis Date  . Diabetes mellitus without complication ([facility]) 2002  . Hypertension 2002   Past Surgical History:  Procedure Laterality Date  . APPENDECTOMY  1970  . JOINT REPLACEMENT  2012   rt knee  . VASECTOMY  1986   Current Outpatient Medications on File Prior to Visit  Medication Sig Dispense Refill  . albuterol (PROVENTIL HFA;VENTOLIN HFA) 108 (90 Base) MCG/ACT inhaler Inhale 2 puffs into the lungs every 6 (six) hours as needed for wheezing or shortness of breath. 1 Inhaler 0  . allopurinol (ZYLOPRIM) 300 MG tablet Take 1 tablet (300 mg total) by mouth daily. 30 tablet 5  . aspirin 325 MG tablet Take 325 mg by mouth daily.    Marland Kitchen atorvastatin (LIPITOR) 80 MG tablet TAKE ONE-HALF (1/2) TABLET DAILY 90 tablet 1  . colchicine 0.6 MG tablet At sign of gout flare: Take 2. One hour later--Take 1. Then, take 1 twice a day until flare resolves 30 tablet 2  . diclofenac (VOLTAREN) 50 MG EC tablet Take 1 tablet (50 mg total) by mouth 3 (three) times daily. 90 tablet 0  . Dulaglutide (TRULICITY) 1.5 MG/0.5ML SOPN INJECT 1 PEN UNDER  THE SKIN ONCE A WEEK 6 mL 4  . glimepiride (AMARYL) 2 MG tablet     . Glucose Blood (BLOOD GLUCOSE TEST STRIPS) STRP Please dispense based on patient and insurance preference. Use as directed to monitor FSBS 3x daily. Dx: [place].Ros. 200 each 1  . hydrOXYzine (ATARAX/VISTARIL) 25 MG tablet Take 1 tablet (25 mg total) by mouth 3 (three) times daily as needed. 90 tablet 1  . lisinopril (PRINIVIL,ZESTRIL) 20 MG tablet TAKE ONE-HALF (1/2) TABLET DAILY 90 tablet 1  . metFORMIN (GLUCOPHAGE) 1000 MG tablet TAKE 1 TABLET TWICE A DAY WITH MEALS 60 tablet 0  . metFORMIN (GLUCOPHAGE) 1000 MG tablet TAKE ONE TABLET BY MOUTH TWICE DAILY WITH  A  MEAL 180 tablet 0  . mometasone (ELOCON) 0.1 % cream Apply 1 application topically 3 (three) times daily as needed. 50 g 1  . Multiple Vitamin (MULTIVITAMIN) tablet Take 1 tablet by mouth daily. MEN'S ONE A DAY 50+    . sildenafil (VIAGRA) 100 MG tablet Take 0.5 tablets (50 mg total) by mouth daily as needed for erectile dysfunction. Reported on 10/27/2015 10 tablet 11  . Specialty Vitamins Products (MAGNESIUM, AMINO ACID CHELATE,) 133 MG tablet Take 1 tablet by mouth 2 (two) times daily.    Marland Kitchen testosterone cypionate (DEPO-TESTOSTERONE) 200 MG/ML injection Inject 1 mL (200  mg total) into the muscle every 14 (fourteen) days. 10 mL 0  . vardenafil (LEVITRA) 20 MG tablet Take 20 mg by mouth. Reported on 10/27/2015    . vitamin E 400 UNIT capsule Take 400 Units by mouth daily.    Marland Kitchen zinc gluconate 50 MG tablet Take 50 mg by mouth daily.     No current facility-administered medications on file prior to visit.   Allergies  Allergen Reactions  . Penicillins    Social History   Socioeconomic History  . Marital status: Married    Spouse name: Not on file  . Number of children: Not on file  . Years of education: Not on file  . Highest education level: Not on file  Occupational History  . Not on file  Tobacco Use  . Smoking status: Never Smoker  . Smokeless tobacco: Never  Used  Substance and Sexual Activity  . Alcohol use: Yes    Alcohol/week: 1.0 standard drink    Types: 1 Cans of beer per week  . Drug use: Not on file  . Sexual activity: Yes    Birth control/protection: Surgical  Other Topics Concern  . Not on file  Social History Narrative  . Not on file   Social Determinants of Health   Financial Resource Strain:   . Difficulty of Paying Living Expenses:   Food Insecurity:   . Worried About Programme researcher, broadcasting/film/video in the Last Year:   . Barista in the Last Year:   Transportation Needs:   . Freight forwarder (Medical):   Marland Kitchen Lack of Transportation (Non-Medical):   Physical Activity:   . Days of Exercise per Week:   . Minutes of Exercise per Session:   Stress:   . Feeling of Stress :   Social Connections:   . Frequency of Communication with Friends and Family:   . Frequency of Social Gatherings with Friends and Family:   . Attends Religious Services:   . Active Member of Clubs or Organizations:   . Attends Banker Meetings:   Marland Kitchen Marital Status:   Intimate Partner Violence:   . Fear of Current or Ex-Partner:   . Emotionally Abused:   Marland Kitchen Physically Abused:   . Sexually Abused:       Review of Systems  All other systems reviewed and are negative.      Objective:   Physical Exam Constitutional:      General: He is not in acute distress.    Appearance: Normal appearance. He is not ill-appearing or toxic-appearing.  Cardiovascular:     Rate and Rhythm: Normal rate and regular rhythm.     Heart sounds: Normal heart sounds.  Pulmonary:     Effort: Pulmonary effort is normal.     Breath sounds: Normal breath sounds.  Genitourinary:   Musculoskeletal:       Legs:  Skin:    Findings: Rash present. Rash is macular.  Neurological:     Mental Status: He is alert.           Assessment & Plan:  Tick bite, initial encounter - Plan: B. burgdorfi antibodies by WB  Candidal intertrigo  Patient appears to  have intertrigo.  Use Lotrisone cream twice daily for 2 to 3 weeks.  Consider oral Diflucan if no better.  Check Lyme titer serologies however the patient is asymptomatic.

## 2020-05-29 LAB — B. BURGDORFI ANTIBODIES BY WB

## 2020-10-16 ENCOUNTER — Emergency Department (HOSPITAL_COMMUNITY)
Admission: EM | Admit: 2020-10-16 | Discharge: 2020-10-16 | Disposition: A | Discharge status: Home / Self Care | Payer: Medicare Other | Attending: Emergency Medicine | Admitting: Emergency Medicine

## 2020-10-16 ENCOUNTER — Other Ambulatory Visit: Payer: Self-pay

## 2020-10-16 ENCOUNTER — Encounter (HOSPITAL_COMMUNITY): Payer: Self-pay

## 2020-10-16 DIAGNOSIS — Z20822 Contact with and (suspected) exposure to covid-19: Secondary | ICD-10-CM | POA: Diagnosis not present

## 2020-10-16 DIAGNOSIS — R55 Syncope and collapse: Secondary | ICD-10-CM

## 2020-10-16 DIAGNOSIS — N289 Disorder of kidney and ureter, unspecified: Secondary | ICD-10-CM | POA: Diagnosis not present

## 2020-10-16 DIAGNOSIS — I10 Essential (primary) hypertension: Secondary | ICD-10-CM | POA: Insufficient documentation

## 2020-10-16 DIAGNOSIS — Z79899 Other long term (current) drug therapy: Secondary | ICD-10-CM | POA: Diagnosis not present

## 2020-10-16 DIAGNOSIS — Z7982 Long term (current) use of aspirin: Secondary | ICD-10-CM | POA: Insufficient documentation

## 2020-10-16 DIAGNOSIS — R61 Generalized hyperhidrosis: Secondary | ICD-10-CM | POA: Insufficient documentation

## 2020-10-16 DIAGNOSIS — Z7984 Long term (current) use of oral hypoglycemic drugs: Secondary | ICD-10-CM | POA: Diagnosis not present

## 2020-10-16 DIAGNOSIS — E785 Hyperlipidemia, unspecified: Secondary | ICD-10-CM | POA: Diagnosis not present

## 2020-10-16 DIAGNOSIS — E1169 Type 2 diabetes mellitus with other specified complication: Secondary | ICD-10-CM | POA: Diagnosis not present

## 2020-10-16 DIAGNOSIS — Z96651 Presence of right artificial knee joint: Secondary | ICD-10-CM | POA: Insufficient documentation

## 2020-10-16 LAB — CBC WITH DIFFERENTIAL/PLATELET
Abs Immature Granulocytes: 0.03 10*3/uL (ref 0.00–0.07)
Basophils Absolute: 0 10*3/uL (ref 0.0–0.1)
Basophils Relative: 1 %
Eosinophils Absolute: 0.1 10*3/uL (ref 0.0–0.5)
Eosinophils Relative: 2 %
HCT: 46 % (ref 39.0–52.0)
Hemoglobin: 14.8 g/dL (ref 13.0–17.0)
Immature Granulocytes: 1 %
Lymphocytes Relative: 26 %
Lymphs Abs: 1.4 10*3/uL (ref 0.7–4.0)
MCH: 29.3 pg (ref 26.0–34.0)
MCHC: 32.2 g/dL (ref 30.0–36.0)
MCV: 91.1 fL (ref 80.0–100.0)
Monocytes Absolute: 0.7 10*3/uL (ref 0.1–1.0)
Monocytes Relative: 13 %
Neutro Abs: 3.2 10*3/uL (ref 1.7–7.7)
Neutrophils Relative %: 57 %
Platelets: 128 10*3/uL — ABNORMAL LOW (ref 150–400)
RBC: 5.05 MIL/uL (ref 4.22–5.81)
RDW: 14.4 % (ref 11.5–15.5)
WBC: 5.5 10*3/uL (ref 4.0–10.5)
nRBC: 0 % (ref 0.0–0.2)

## 2020-10-16 LAB — BLOOD GAS, VENOUS
Acid-base deficit: 1.6 mmol/L (ref 0.0–2.0)
Bicarbonate: 21.7 mmol/L (ref 20.0–28.0)
FIO2: 21
O2 Saturation: 59.9 %
Patient temperature: 37
pCO2, Ven: 46.2 mmHg (ref 44.0–60.0)
pH, Ven: 7.328 (ref 7.250–7.430)
pO2, Ven: 39.1 mmHg (ref 32.0–45.0)

## 2020-10-16 LAB — SARS CORONAVIRUS 2 (TAT 6-24 HRS): SARS Coronavirus 2: NEGATIVE

## 2020-10-16 LAB — CBG MONITORING, ED: Glucose-Capillary: 277 mg/dL — ABNORMAL HIGH (ref 70–99)

## 2020-10-16 LAB — COMPREHENSIVE METABOLIC PANEL
ALT: 26 U/L (ref 0–44)
AST: 28 U/L (ref 15–41)
Albumin: 3.5 g/dL (ref 3.5–5.0)
Alkaline Phosphatase: 66 U/L (ref 38–126)
Anion gap: 10 (ref 5–15)
BUN: 31 mg/dL — ABNORMAL HIGH (ref 8–23)
CO2: 22 mmol/L (ref 22–32)
Calcium: 8.6 mg/dL — ABNORMAL LOW (ref 8.9–10.3)
Chloride: 106 mmol/L (ref 98–111)
Creatinine, Ser: 1.75 mg/dL — ABNORMAL HIGH (ref 0.61–1.24)
GFR, Estimated: 42 mL/min — ABNORMAL LOW (ref >60)
Glucose, Bld: 274 mg/dL — ABNORMAL HIGH (ref 70–99)
Potassium: 4 mmol/L (ref 3.5–5.1)
Sodium: 138 mmol/L (ref 135–145)
Total Bilirubin: 0.8 mg/dL (ref 0.3–1.2)
Total Protein: 6.5 g/dL (ref 6.5–8.1)

## 2020-10-16 MED ORDER — SODIUM CHLORIDE 0.9 % IV BOLUS
1000.0000 mL | Freq: Once | INTRAVENOUS | Status: AC
  Administered 2020-10-16: 1000 mL via INTRAVENOUS

## 2020-10-16 NOTE — ED Notes (Signed)
Pt placed on 2L [place]. While pt is ambulatory to bathroom sats are 98-99% on RA. While resting O2 drops to 88-90%.

## 2020-10-16 NOTE — Discharge Instructions (Signed)
It appears that you passed out because your blood pressure was low.  Make sure you're eating and drinking well, and try to have regular bowel movements.  Sometimes being constipated and having an urge to defecate, can cause people to become faint.  Also today, your blood pressure is mildly low.  Please do not take your lisinopril for 3 days, then restart it.  This is a blood pressure medicine and may be contributing to your symptoms today.  He also may be a little bit dehydrated so make sure you're drinking plenty of fluids, especially water.  The lisinopril also can affect your kidney function so ask your doctor about it next week.  Follow-up with your primary care doctor for checkup next week for blood pressure and medication check.

## 2020-10-16 NOTE — ED Notes (Signed)
Pt ambulatory in hall independently.

## 2020-10-16 NOTE — ED Provider Notes (Signed)
[facility] EMERGENCY DEPARTMENT Provider Note   CSN: [id number] Arrival date & time: 10/16/20  1049     History Chief Complaint  Patient presents with  . Loss of Consciousness    Keith Schwartz is a 69 y.o. male.  HPI Patient arrived EMS for evaluation of syncope, while standing in line at a grocery store.  It is unclear if he went to ground.  EMS found him sitting in a chair, hypotensive and diaphoretic.  He was treated with IV fluid bolus, 500 mg.(!) 111/58 Initial blood pressure was low, "in the 70s.".  After bolus it improved to the mid 80s.  On arrival patient is lucid and complains only of urge to defecate.  He denies headache, chest pain, shortness of breath, focal weakness or paresthesia.  He denies recent illnesses.  He states he is taking his usual medications.  There are no other known modifying factors.    Past Medical History:  Diagnosis Date  . Diabetes mellitus without complication (HCC) 2002  . Hypertension 2002    Patient Active Problem List   Diagnosis Date Noted  . Gout 05/16/2017  . Hyperlipemia 10/27/2015  . Erectile dysfunction 10/27/2015  . Testosterone deficiency 10/27/2015  . Diabetes mellitus without complication (HCC)   . Hypertension     Past Surgical History:  Procedure Laterality Date  . APPENDECTOMY  1970  . JOINT REPLACEMENT  2012   rt knee  . VASECTOMY  1986       Family History  Problem Relation Age of Onset  . Arthritis Mother   . Asthma Mother   . Hearing loss Mother   . Vision loss Mother   . Heart disease Father   . Hyperlipidemia Father   . Hypertension Father   . Early death Sister     Social History   Tobacco Use  . Smoking status: Never Smoker  . Smokeless tobacco: Never Used  Substance Use Topics  . Alcohol use: Yes    Alcohol/week: 1.0 standard drink    Types: 1 Cans of beer per week  . Drug use: Never    Home Medications Prior to Admission medications   Medication Sig Start Date End Date Taking?  Authorizing Provider  albuterol (PROVENTIL HFA;VENTOLIN HFA) 108 (90 Base) MCG/ACT inhaler Inhale 2 puffs into the lungs every 6 (six) hours as needed for wheezing or shortness of breath. 01/27/18   Danelle Berry, PA-C  allopurinol (ZYLOPRIM) 300 MG tablet Take 1 tablet (300 mg total) by mouth daily. 05/24/17   Dorena Bodo, PA-C  aspirin 325 MG tablet Take 325 mg by mouth daily.    [provider]  atorvastatin (LIPITOR) 80 MG tablet TAKE ONE-HALF (1/2) TABLET DAILY 12/02/16   Donita Brooks, MD  clotrimazole-betamethasone (LOTRISONE) cream Apply 1 application topically 2 (two) times daily. 05/22/20   Donita Brooks, MD  colchicine 0.6 MG tablet At sign of gout flare: Take 2. One hour later--Take 1. Then, take 1 twice a day until flare resolves 02/10/17   Allayne Butcher B, PA-C  diclofenac (VOLTAREN) 50 MG EC tablet Take 1 tablet (50 mg total) by mouth 3 (three) times daily. 02/03/17   Dorena Bodo, PA-C  Dulaglutide (TRULICITY) 1.5 MG/0.5ML SOPN INJECT 1 PEN UNDER THE SKIN ONCE A WEEK 09/28/19   Donita Brooks, MD  glimepiride (AMARYL) 2 MG tablet  12/28/17   [provider]  Glucose Blood (BLOOD GLUCOSE TEST STRIPS) STRP Please dispense based on patient and insurance preference.  Use as directed to monitor FSBS 3x daily. Dx: [id number]. 02/06/18   Danelle Berry, PA-C  hydrOXYzine (ATARAX/VISTARIL) 25 MG tablet Take 1 tablet (25 mg total) by mouth 3 (three) times daily as needed. 11/24/15   Donita Brooks, MD  lisinopril (PRINIVIL,ZESTRIL) 20 MG tablet TAKE ONE-HALF (1/2) TABLET DAILY 10/14/16   Donita Brooks, MD  metFORMIN (GLUCOPHAGE) 1000 MG tablet TAKE 1 TABLET TWICE A DAY WITH MEALS 12/06/16   Allayne Butcher B, PA-C  metFORMIN (GLUCOPHAGE) 1000 MG tablet TAKE ONE TABLET BY MOUTH TWICE DAILY WITH  A  MEAL 06/05/18   Dixon, Mary B, PA-C  mometasone (ELOCON) 0.1 % cream Apply 1 application topically 3 (three) times daily as needed. 07/05/19   Donita Brooks, MD  Multiple Vitamin  (MULTIVITAMIN) tablet Take 1 tablet by mouth daily. MEN'S ONE A DAY 50+    [provider]  sildenafil (VIAGRA) 100 MG tablet Take 0.5 tablets (50 mg total) by mouth daily as needed for erectile dysfunction. Reported on 10/27/2015 11/01/19   Donita Brooks, MD  Specialty Vitamins Products (MAGNESIUM, AMINO ACID CHELATE,) 133 MG tablet Take 1 tablet by mouth 2 (two) times daily.    [provider]  testosterone cypionate (DEPO-TESTOSTERONE) 200 MG/ML injection Inject 1 mL (200 mg total) into the muscle every 14 (fourteen) days. 05/16/17   Dorena Bodo, PA-C  vardenafil (LEVITRA) 20 MG tablet Take 20 mg by mouth. Reported on 10/27/2015    [provider]  vitamin E 400 UNIT capsule Take 400 Units by mouth daily.    [provider]  zinc gluconate 50 MG tablet Take 50 mg by mouth daily.    [provider]    Allergies    Penicillins  Review of Systems   Review of Systems  All other systems reviewed and are negative.   Physical Exam Updated Vital Signs BP (!) 111/58   Pulse 69   Temp 98.2 F (36.8 C) (Oral)   Resp 17   Ht 6\' 4"  (1.93 m)   Wt 113.4 kg   SpO2 93%   BMI 30.43 kg/m   Physical Exam Vitals and nursing note reviewed.  Constitutional:      General: He is not in acute distress.    Appearance: He is well-developed and well-nourished. He is not ill-appearing, toxic-appearing or diaphoretic.  HENT:     Head: Normocephalic and atraumatic.     Right Ear: External ear normal.     Left Ear: External ear normal.     Nose: No congestion.     Mouth/Throat:     Pharynx: No oropharyngeal exudate or posterior oropharyngeal erythema.  Eyes:     Extraocular Movements: EOM normal.     Conjunctiva/sclera: Conjunctivae normal.     Pupils: Pupils are equal, round, and reactive to light.  Neck:     Trachea: Phonation normal.  Cardiovascular:     Rate and Rhythm: Normal rate and regular rhythm.     Heart sounds: Normal heart sounds.   Pulmonary:     Effort: Pulmonary effort is normal.     Breath sounds: Normal breath sounds.  Chest:     Chest wall: No bony tenderness.  Abdominal:     General: There is no distension.     Palpations: Abdomen is soft.     Tenderness: There is no abdominal tenderness.  Musculoskeletal:        General: Normal range of motion.     Cervical back: Normal range  of motion and neck supple.  Skin:    General: Skin is warm, dry and intact.  Neurological:     Mental Status: He is alert and oriented to person, place, and time.     Cranial Nerves: No cranial nerve deficit.     Sensory: No sensory deficit.     Motor: No abnormal muscle tone.     Coordination: Coordination normal.  Psychiatric:        Mood and Affect: Mood and affect and mood normal.        Behavior: Behavior normal.        Thought Content: Thought content normal.        Judgment: Judgment normal.     ED Results / Procedures / Treatments   Labs (all labs ordered are listed, but only abnormal results are displayed) Labs Reviewed  COMPREHENSIVE METABOLIC PANEL - Abnormal; Notable for the following components:      Result Value   Glucose, Bld 274 (*)    BUN 31 (*)    Creatinine, Ser 1.75 (*)    Calcium 8.6 (*)    GFR, Estimated 42 (*)    All other components within normal limits  CBC WITH DIFFERENTIAL/PLATELET - Abnormal; Notable for the following components:   Platelets 128 (*)    All other components within normal limits  CBG MONITORING, ED - Abnormal; Notable for the following components:   Glucose-Capillary 277 (*)    All other components within normal limits  SARS CORONAVIRUS 2 (TAT 6-24 HRS)  BLOOD GAS, VENOUS    EKG EKG Interpretation  Date/Time:  Thursday October 16 2020 10:57:15 EST Ventricular Rate:  69 PR Interval:    QRS Duration: 172 QT Interval:  484 QTC Calculation: 519 R Axis:   0 Text Interpretation: Sinus rhythm IVCD, consider atypical LBBB No old tracing to compare Confirmed by Mancel Bale [phone]) on 10/16/2020 1:05:34 PM   Radiology No results found.  Procedures Procedures (including critical care time)  Medications Ordered in ED Medications  sodium chloride 0.9 % bolus 1,000 mL (0 mLs Intravenous Stopped 10/16/20 1235)    ED Course  I have reviewed the triage vital signs and the nursing notes.  Pertinent labs & imaging results that were available during my care of the patient were reviewed by me and considered in my medical decision making (see chart for details).    MDM Rules/Calculators/A&P                           Patient Vitals for the past 24 hrs:  BP Temp Temp src Pulse Resp SpO2 Height Weight  10/16/20 1500 (!) 111/58 -- -- 69 17 93 % -- --  10/16/20 1430 (!) 102/49 -- -- 65 18 91 % -- --  10/16/20 1400 96/63 -- -- 67 17 90 % -- --  10/16/20 1330 (!) 102/51 -- -- 66 17 93 % -- --  10/16/20 1300 (!) 96/53 -- -- 70 17 (!) 88 % -- --  10/16/20 1240 -- 98.2 F (36.8 C) Oral -- -- -- -- --  10/16/20 1230 (!) 98/52 -- -- 71 -- 90 % -- --  10/16/20 1210 (!) 115/55 -- -- 70 18 99 % -- --  10/16/20 1130 (!) 102/58 -- -- 65 17 (!) 86 % -- --  10/16/20 1100 (!) 92/59 -- -- 69 20 (!) 89 % -- --  10/16/20 1052 -- -- -- -- -- -- 6\' 4"  (1.93  m) 113.4 kg    3:00 PM Reevaluation with update and discussion. After initial assessment and treatment, an updated evaluation reveals patient states he feels better now.  He has had a large bowel movement here.  Vital signs have stabilized.  Orthostatic blood pressure and pulses are negative. Mancel BaleElliott Chinara Hertzberg   Medical Decision Making:  This patient is presenting for evaluation of syncope, which does require a range of treatment options, and is a complaint that involves a high risk of morbidity and mortality. The differential diagnoses include acute cardiac disorder, metabolic disorder, infectious process. I decided to review old records, and in summary elderly male, presenting with syncope and urge to defecate.  I did  not require additional historical information from anyone.  Clinical Laboratory Tests Ordered, included CBC, Metabolic panel and Venous blood gas. Review indicates normal except platelets low, glucose high, creatinine high, BUN high, calcium low, GFR low.    Cardiac Monitor Tracing which shows normal sinus rhythm    Critical Interventions-clinical evaluation, laboratory testing, orthostatic blood pressure and pulse, IV fluid treatment, observation reassessment  After These Interventions, the Patient was reevaluated and was found improved after having a bowel movement in the ED.  Suspect vasovagal response causing syncope, with mild renal insufficiency possibly due to dehydration versus use of lisinopril as protective agent for diabetes.  Patient does not have documented high blood pressure in the past.  CRITICAL CARE-no Performed by: Mancel BaleElliott Monya Kozakiewicz  Nursing Notes Reviewed/ Care Coordinated Applicable Imaging Reviewed Interpretation of Laboratory Data incorporated into ED treatment  The patient appears reasonably screened and/or stabilized for discharge and I doubt any other medical condition or other [facility] requiring further screening, evaluation, or treatment in the ED at this time prior to discharge.  Plan: Home Medications-continue usual medications, except hold lisinopril for 3 days; Home Treatments-rest, increase oral fluid; return here if the recommended treatment, does not improve the symptoms; Recommended follow up-PCP, checkup next week and as needed     Final Clinical Impression(s) / ED Diagnoses Final diagnoses:  Syncope, unspecified syncope type  Renal insufficiency    Rx / DC Orders ED Discharge Orders    None       Mancel BaleWentz, Galilee Pierron, MD 10/16/20 1512

## 2020-10-16 NOTE — ED Triage Notes (Signed)
Pt presents to ED emergency traffic via [facility] for LOC at [facility]. Per EMS, pt diaphoretic, hypotensive. CBG 295. Pt oriented x 4 in triage.

## 2020-10-24 ENCOUNTER — Ambulatory Visit (INDEPENDENT_AMBULATORY_CARE_PROVIDER_SITE_OTHER): Payer: Medicare Other | Admitting: Family Medicine

## 2020-10-24 ENCOUNTER — Other Ambulatory Visit: Payer: Self-pay

## 2020-10-24 ENCOUNTER — Encounter: Payer: Self-pay | Admitting: Family Medicine

## 2020-10-24 VITALS — BP 150/82 | HR 86 | Temp 98.9°F | Ht 76.0 in | Wt 257.0 lb

## 2020-10-24 DIAGNOSIS — R55 Syncope and collapse: Secondary | ICD-10-CM | POA: Diagnosis not present

## 2020-10-24 DIAGNOSIS — I454 Nonspecific intraventricular block: Secondary | ICD-10-CM

## 2020-10-24 NOTE — Progress Notes (Signed)
Subjective:    Patient ID: Keith Schwartz, male    DOB: [birth year], 70 y.o.   MRN: [id number]  HPI  Recently, the patient had a syncopal episode.  He was at [facility].  He states that he been feeling weak and tired or as he put it "punk".  He checked his blood pressure at [place] and found it to be in the 16R systolic.  He then went to stand in line.  He had to defecate.  While he was waiting in line, he started to feel extremely lightheaded and lost consciousness falling and collapsing to the floor.  He lost control of his bowels.  There was no witnessed seizure activity.  However, EMS was contacted and the patient was taken to the emergency room at [facility].  I reviewed the emergency room physician's findings.  I reviewed the ER EKG.  There is a noticeable interventricular conduction delay with a widened QRS complex.  Patient denies any history of this although he states that when he was younger they wanted him to see a heart specialist who told him everything was fine.  He denies any recent stress test or echocardiogram.  Also the patient has poor R wave progression in the precordial leads concerning for possible anteroseptal infarction.  He denies any history of congestive heart failure or syncope or arrhythmia.  However he does have a history of poorly controlled diabetes.  His medical care is provided at the [place] therefore I have no records to review here. Past Medical History:  Diagnosis Date  . Diabetes mellitus without complication ([place]) 6789  . Hypertension 2002   Past Surgical History:  Procedure Laterality Date  . APPENDECTOMY  1970  . JOINT REPLACEMENT  2012   rt knee  . VASECTOMY  1986   Current Outpatient Medications on File Prior to Visit  Medication Sig Dispense Refill  . albuterol (PROVENTIL HFA;VENTOLIN HFA) 108 (90 Base) MCG/ACT inhaler Inhale 2 puffs into the lungs every 6 (six) hours as needed for wheezing or shortness of breath. 1 Inhaler 0  . allopurinol (ZYLOPRIM)  300 MG tablet Take 1 tablet (300 mg total) by mouth daily. 30 tablet 5  . aspirin 325 MG tablet Take 325 mg by mouth daily.    Marland Kitchen atorvastatin (LIPITOR) 80 MG tablet TAKE ONE-HALF (1/2) TABLET DAILY 90 tablet 1  . clotrimazole-betamethasone (LOTRISONE) cream Apply 1 application topically 2 (two) times daily. 30 g 2  . colchicine 0.6 MG tablet At sign of gout flare: Take 2. One hour later--Take 1. Then, take 1 twice a day until flare resolves 30 tablet 2  . diclofenac (VOLTAREN) 50 MG EC tablet Take 1 tablet (50 mg total) by mouth 3 (three) times daily. 90 tablet 0  . Dulaglutide (TRULICITY) 1.5 FY/1.0FB SOPN INJECT 1 PEN UNDER THE SKIN ONCE A WEEK 6 mL 4  . glimepiride (AMARYL) 2 MG tablet     . Glucose Blood (BLOOD GLUCOSE TEST STRIPS) STRP Please dispense based on patient and insurance preference. Use as directed to monitor FSBS 3x daily. Dx: [facility]. 200 each 1  . hydrOXYzine (ATARAX/VISTARIL) 25 MG tablet Take 1 tablet (25 mg total) by mouth 3 (three) times daily as needed. 90 tablet 1  . lisinopril (PRINIVIL,ZESTRIL) 20 MG tablet TAKE ONE-HALF (1/2) TABLET DAILY 90 tablet 1  . metFORMIN (GLUCOPHAGE) 1000 MG tablet TAKE 1 TABLET TWICE A DAY WITH MEALS 60 tablet 0  . metFORMIN (GLUCOPHAGE) 1000 MG tablet TAKE ONE TABLET BY MOUTH TWICE  DAILY WITH  A  MEAL 180 tablet 0  . mometasone (ELOCON) 0.1 % cream Apply 1 application topically 3 (three) times daily as needed. 50 g 1  . Multiple Vitamin (MULTIVITAMIN) tablet Take 1 tablet by mouth daily. MEN'S ONE A DAY 50+    . sildenafil (VIAGRA) 100 MG tablet Take 0.5 tablets (50 mg total) by mouth daily as needed for erectile dysfunction. Reported on 10/27/2015 10 tablet 11  . Specialty Vitamins Products (MAGNESIUM, AMINO ACID CHELATE,) 133 MG tablet Take 1 tablet by mouth 2 (two) times daily.    Marland Kitchen testosterone cypionate (DEPO-TESTOSTERONE) 200 MG/ML injection Inject 1 mL (200 mg total) into the muscle every 14 (fourteen) days. 10 mL 0  . vardenafil (LEVITRA)  20 MG tablet Take 20 mg by mouth. Reported on 10/27/2015    . vitamin E 400 UNIT capsule Take 400 Units by mouth daily.    Marland Kitchen zinc gluconate 50 MG tablet Take 50 mg by mouth daily.     No current facility-administered medications on file prior to visit.   Allergies  Allergen Reactions  . Penicillins    Social History   Socioeconomic History  . Marital status: Married    Spouse name: Not on file  . Number of children: Not on file  . Years of education: Not on file  . Highest education level: Not on file  Occupational History  . Not on file  Tobacco Use  . Smoking status: Never Smoker  . Smokeless tobacco: Never Used  Substance and Sexual Activity  . Alcohol use: Yes    Alcohol/week: 1.0 standard drink    Types: 1 Cans of beer per week  . Drug use: Never  . Sexual activity: Yes    Birth control/protection: Surgical  Other Topics Concern  . Not on file  Social History Narrative  . Not on file   Social Determinants of Health   Financial Resource Strain: Not on file  Food Insecurity: Not on file  Transportation Needs: Not on file  Physical Activity: Not on file  Stress: Not on file  Social Connections: Not on file  Intimate Partner Violence: Not on file      Review of Systems  All other systems reviewed and are negative.      Objective:   Physical Exam Vitals reviewed.  Cardiovascular:     Rate and Rhythm: Normal rate and regular rhythm.  Pulmonary:     Effort: Pulmonary effort is normal.     Breath sounds: Normal breath sounds.  Skin:    Findings: Rash present.  Neurological:     Mental Status: He is alert.           Assessment & Plan:  Syncope and collapse - Plan: Ambulatory referral to Cardiology  IVCD (intraventricular conduction defect) - Plan: Ambulatory referral to Cardiology   Given his age, diabetes, and interventricular conduction delay, I believe he would benefit from a cardiology consultation for an echocardiogram as well as a cardiac  monitor.  I am concerned about suppressed ejection fraction and sudden cardiac death due to cardiac arrhythmia.  If the patient is found to have an EF less than 30% with prolonged QRS complex, he may benefit from a ICD placement as well as additional work-up.  I discussed this with him.  Although the risk of this is low, given his abnormal EKG, I do feel that he would benefit from this and also a cardiac monitor to evaluate for any arrhythmia.  In the meantime  of asked him to hold the lisinopril and start checking his blood pressure at home to see what his blood pressure is doing at home.

## 2020-10-31 ENCOUNTER — Ambulatory Visit: Payer: TRICARE For Life (TFL) | Admitting: Cardiology

## 2020-11-09 NOTE — Progress Notes (Addendum)
Cardiology Office Note:    Date:  11/12/2020   ID:  Keith Schwartz, DOB 12-20-1950, MRN [id number]  PCP:  Donita Brooks, MD  Cardiologist:  No primary care provider on file.  Electrophysiologist:  None   Referring MD: Donita Brooks, MD   Chief Complaint  Patient presents with  . Loss of Consciousness    History of Present Illness:    Keith Schwartz is a 70 y.o. male with a hx of T2DM, hypertension who is referred by Dr. Tanya Nones for evaluation of syncope.  He had an episode of syncope on 10/16/2020.  Reports he had bronchitis at the time and had taken DayQuil that morning, in addition to taking lisinopril.  He was at [facility] and had checked his blood pressure and noted it was low, 90s over 60s.  Subsequently was standing in the checkout line and suddenly passed out.  Denies any prodromal symptoms.  States that he fell to the ground, did not hit his head.  Thinks he was unconscious only for a few seconds.  EMS was called and reportedly BP was down to 70s.  He received IV fluids and BP up to 90s on arrival to ED.  In ED, EKG showed left bundle branch block.  Labs notable for creatinine 1.75.  He has not taken lisinopril since discharge from ED.  He denies any lightheadedness or syncope since this episode, or prior to that.  Denies any palpitations.  Does report he has been having chest pain.  Describes as pressure in center of upper chest that occurs with exertion.  States that he will continue to exert himself and it will subside, typically last for 1 to 2 minutes.  No smoking history.  Family history includes father had MI and died of CHF in 16s.   Past Medical History:  Diagnosis Date  . Diabetes mellitus without complication (HCC) 2002  . Hypertension 2002    Past Surgical History:  Procedure Laterality Date  . APPENDECTOMY  1970  . JOINT REPLACEMENT  2012   rt knee  . VASECTOMY  1986    Current Medications: Current Meds  Medication Sig  . albuterol (PROVENTIL  HFA;VENTOLIN HFA) 108 (90 Base) MCG/ACT inhaler Inhale 2 puffs into the lungs every 6 (six) hours as needed for wheezing or shortness of breath.  . allopurinol (ZYLOPRIM) 300 MG tablet Take 1 tablet (300 mg total) by mouth daily.  Marland Kitchen aspirin 325 MG tablet Take 325 mg by mouth daily.  Marland Kitchen atorvastatin (LIPITOR) 80 MG tablet TAKE ONE-HALF (1/2) TABLET DAILY  . clotrimazole-betamethasone (LOTRISONE) cream Apply 1 application topically 2 (two) times daily.  . colchicine 0.6 MG tablet At sign of gout flare: Take 2. One hour later--Take 1. Then, take 1 twice a day until flare resolves  . diclofenac (VOLTAREN) 50 MG EC tablet Take 1 tablet (50 mg total) by mouth 3 (three) times daily.  Marland Kitchen glimepiride (AMARYL) 2 MG tablet   . Glucose Blood (BLOOD GLUCOSE TEST STRIPS) STRP Please dispense based on patient and insurance preference. Use as directed to monitor FSBS 3x daily. Dx: B3532.  . hydrOXYzine (ATARAX/VISTARIL) 25 MG tablet Take 1 tablet (25 mg total) by mouth 3 (three) times daily as needed.  . metFORMIN (GLUCOPHAGE) 1000 MG tablet TAKE 1 TABLET TWICE A DAY WITH MEALS  . metFORMIN (GLUCOPHAGE) 1000 MG tablet TAKE ONE TABLET BY MOUTH TWICE DAILY WITH  A  MEAL  . mometasone (ELOCON) 0.1 % cream Apply 1 application topically 3 (  three) times daily as needed.  . Multiple Vitamin (MULTIVITAMIN) tablet Take 1 tablet by mouth daily. MEN'S ONE A DAY 50+  . nitroGLYCERIN (NITROSTAT) 0.4 MG SL tablet Place 1 tablet (0.4 mg total) under the tongue every 5 (five) minutes as needed for chest pain.  . sildenafil (VIAGRA) 100 MG tablet Take 0.5 tablets (50 mg total) by mouth daily as needed for erectile dysfunction. Reported on 10/27/2015  . Specialty Vitamins Products (MAGNESIUM, AMINO ACID CHELATE,) 133 MG tablet Take 1 tablet by mouth 2 (two) times daily.  Marland Kitchen. testosterone cypionate (DEPO-TESTOSTERONE) 200 MG/ML injection Inject 1 mL (200 mg total) into the muscle every 14 (fourteen) days.  . TRULICITY 1.5 MG/0.5ML SOPN  INJECT 1 PEN UNDER THE SKIN ONCE A WEEK  . vardenafil (LEVITRA) 20 MG tablet Take 20 mg by mouth. Reported on 10/27/2015  . vitamin E 400 UNIT capsule Take 400 Units by mouth daily.  Marland Kitchen. zinc gluconate 50 MG tablet Take 50 mg by mouth daily.     Allergies:   Penicillins   Social History   Socioeconomic History  . Marital status: Married    Spouse name: Not on file  . Number of children: Not on file  . Years of education: Not on file  . Highest education level: Not on file  Occupational History  . Not on file  Tobacco Use  . Smoking status: Never Smoker  . Smokeless tobacco: Never Used  Substance and Sexual Activity  . Alcohol use: Yes    Alcohol/week: 1.0 standard drink    Types: 1 Cans of beer per week  . Drug use: Never  . Sexual activity: Yes    Birth control/protection: Surgical  Other Topics Concern  . Not on file  Social History Narrative  . Not on file   Social Determinants of Health   Financial Resource Strain: Not on file  Food Insecurity: Not on file  Transportation Needs: Not on file  Physical Activity: Not on file  Stress: Not on file  Social Connections: Not on file     Family History: The patient's family history includes Arthritis in his mother; Asthma in his mother; Early death in his sister; Hearing loss in his mother; Heart disease in his father; Hyperlipidemia in his father; Hypertension in his father; Vision loss in his mother.  ROS:   Please see the history of present illness.     All other systems reviewed and are negative.  EKGs/Labs/Other Studies Reviewed:    The following studies were reviewed today:  EKG:  EKG is ordered today.  The ekg ordered today demonstrates normal sinus rhythm, rate 72, left bundle branch block  Recent Labs: 10/16/2020: ALT 26; BUN 31; Creatinine, Ser 1.75; Hemoglobin 14.8; Platelets 128; Potassium 4.0; Sodium 138  Recent Lipid Panel No results found for: CHOL, TRIG, HDL, CHOLHDL, VLDL, LDLCALC,  LDLDIRECT  Physical Exam:    VS:  BP 132/68   Pulse 72   Ht 6\' 4"  (1.93 m)   Wt 260 lb (117.9 kg)   BMI 31.65 kg/m     Wt Readings from Last 3 Encounters:  11/12/20 260 lb (117.9 kg)  10/24/20 257 lb (116.6 kg)  10/16/20 250 lb (113.4 kg)     GEN: Well nourished, well developed in no acute distress HEENT: Normal NECK: No JVD; No carotid bruits LYMPHATICS: No lymphadenopathy CARDIAC:RRR, 2 out of 6 systolic murmur RESPIRATORY:  Clear to auscultation without rales, wheezing or rhonchi  ABDOMEN: Soft, non-tender, non-distended MUSCULOSKELETAL:  No  edema; No deformity  SKIN: Warm and dry NEUROLOGIC:  Alert and oriented x 3 PSYCHIATRIC:  Normal affect   ASSESSMENT:    1. Syncope, unspecified syncope type   2. Precordial pain   3. Medication management   4. LBBB (left bundle branch block)   5. AKI (acute kidney injury) (HCC)   6. Essential hypertension   7. Hyperlipidemia, unspecified hyperlipidemia type    PLAN:    Syncope: Suspect due to hypotension, which may have been due to dehydration in setting of URI and continuing to take lisinopril.  SBP in 70s on EMS arrival.  No prodromal symptoms, arrhythmia remains on differential.  Will check Zio patch x2 weeks.  Will check echocardiogram to evaluate for structural heart disease.  Chest pain: Symptoms concerning for angina.  EKG with left bundle branch block.  Given renal dysfunction, will hold off on coronary CTA or heart catheterization and start with Lexiscan Myoview to evaluate for ischemia.  If high risk, will proceed with catheterization.  Will prescribe SL NTG as needed.  Left bundle branch block: Echocardiogram to evaluate for structural heart disease as above  Hypertension: Previously was on lisinopril 10 mg daily but held after recent episode of hypotension.  Normotensive in clinic today, would continue to hold lisinopril  Hyperlipidemia: On atorvastatin 40 mg daily.  Will check lipid panel  T2DM: On metformin,  glimepiride, Trulicity  AKI: Creatinine 1.75 on recent ED visit, unclear baseline.  Will check BMP  RTC in 3 months  Shared Decision Making/Informed Consent The risks [chest pain, shortness of breath, cardiac arrhythmias, dizziness, blood pressure fluctuations, myocardial infarction, stroke/transient ischemic attack, nausea, vomiting, allergic reaction, radiation exposure, metallic taste sensation and life-threatening complications (estimated to be 1 in 10,000)], benefits (risk stratification, diagnosing coronary artery disease, treatment guidance) and alternatives of a nuclear stress test were discussed in detail with Mr. Hadden and he agrees to proceed.      Medication Adjustments/Labs and Tests Ordered: Current medicines are reviewed at length with the patient today.  Concerns regarding medicines are outlined above.  Orders Placed This Encounter  Procedures  . Basic metabolic panel  . Lipid panel  . MYOCARDIAL PERFUSION IMAGING  . LONG TERM MONITOR (3-14 DAYS)  . EKG 12-Lead  . ECHOCARDIOGRAM COMPLETE   Meds ordered this encounter  Medications  . nitroGLYCERIN (NITROSTAT) 0.4 MG SL tablet    Sig: Place 1 tablet (0.4 mg total) under the tongue every 5 (five) minutes as needed for chest pain.    Dispense:  25 tablet    Refill:  3    Patient Instructions  Medication Instructions:  Nitroglycerin: Place 1 tablet (0.4 mg total) under the tongue every 5 (five) minutes as needed for chest pain ( no more than 3 dose).   *If you need a refill on your cardiac medications before your next appointment, please call your pharmacy*   Lab Work: Your physician recommends lab work today (BMP,Lipid).  If you have labs (blood work) drawn today and your tests are completely normal, you will receive your results only by: Marland Kitchen MyChart Message (if you have MyChart) OR . A paper copy in the mail If you have any lab test that is abnormal or we need to change your treatment, we will call you to  review the results.   Testing/Procedures: Your physician has requested that you have an echocardiogram. Echocardiography is a painless test that uses sound waves to create images of your heart. It provides your doctor with information about  the size and shape of your heart and how well your heart's chambers and valves are working. This procedure takes approximately one hour. There are no restrictions for this procedure. [address]  Your physician has requested that you have a lexiscan myoview. For further information please visit [email or web address] Please follow instruction sheet, as given. [address]  Our physician has recommended that you wear an 14  DAY ZIO-PATCH monitor. The Zio patch cardiac monitor continuously records heart rhythm data for up to 14 days, this is for patients being evaluated for multiple types heart rhythms. For the first 24 hours post application, please avoid getting the Zio monitor wet in the shower or by excessive sweating during exercise. After that, feel free to carry on with regular activities. Keep soaps and lotions away from the ZIO XT Patch.   Someone from our office will call to verify address and mail monitor.   Follow-Up: At [facility], you and your health needs are our priority.  As part of our continuing mission to provide you with exceptional heart care, we have created designated Provider Care Teams.  These Care Teams include your primary Cardiologist (physician) and Advanced Practice Providers (APPs -  Physician Assistants and Nurse Practitioners) who all work together to provide you with the care you need, when you need it.  We recommend signing up for the patient portal called "MyChart".  Sign up information is provided on this After Visit Summary.  MyChart is used to connect with patients for Virtual Visits (Telemedicine).  Patients are able to view lab/test results, encounter notes, upcoming appointments,  etc.  Non-urgent messages can be sent to your provider as well.   To learn more about what you can do with MyChart, go to [email or web address].    Your next appointment:   3 month(s)  The format for your next appointment:   In Person  Provider:   Epifanio Lesches, MD  You are scheduled for a Myocardial Perfusion Imaging Study on.  Please arrive 15 minutes prior to your appointment time for registration and insurance purposes.  The test will take approximately 3 to 4 hours to complete; you may bring reading material.  If someone comes with you to your appointment, they will need to remain in the main lobby due to limited space in the testing area. **If you are pregnant or breastfeeding, please notify the nuclear lab prior to your appointment**  How to prepare for your Myocardial Perfusion Test: . Do not eat or drink 3 hours prior to your test, except you may have water. . Do not consume products containing caffeine (regular or decaffeinated) 12 hours prior to your test. (ex: coffee, chocolate, sodas, tea). . Do bring a list of your current medications with you.  If not listed below, you may take your medications as normal. . Do wear comfortable clothes (no dresses or overalls) and walking shoes, tennis shoes preferred (No heels or open toe shoes are allowed). . Do NOT wear cologne, perfume, aftershave, or lotions (deodorant is allowed). . If these instructions are not followed, your test will have to be rescheduled.  Please report to [address], [address] for your test.  If you have questions or concerns about your appointment, you can call the Nuclear Lab at [phone].  If you cannot keep your appointment, please provide 24 hours notification to the Nuclear Lab, to avoid a possible $50 charge to your account.  ZIO XT- Long Term  Monitor Instructions   Your physician has requested you wear your ZIO patch monitor__14_____days.   This is a single patch monitor.   [employer] supplies one patch monitor per enrollment.  Additional stickers are not available.   Please do not apply patch if you will be having a Nuclear Stress Test, Echocardiogram, Cardiac CT, MRI, or Chest Xray during the time frame you would be wearing the monitor. The patch cannot be worn during these tests.  You cannot remove and re-apply the ZIO XT patch monitor.   Your ZIO patch monitor will be sent USPS Priority mail from [facility] directly to your home address. The monitor may also be mailed to a PO BOX if home delivery is not available.   It may take 3-5 days to receive your monitor after you have been enrolled.   Once you have received you monitor, please review enclosed instructions.  Your monitor has already been registered assigning a specific monitor serial # to you.   Applying the monitor   Shave hair from upper left chest.   Hold abrader disc by orange tab.  Rub abrader in 40 strokes over left upper chest as indicated in your monitor instructions.   Clean area with 4 enclosed alcohol pads .  Use all pads to assure are is cleaned thoroughly.  Let dry.   Apply patch as indicated in monitor instructions.  Patch will be place under collarbone on left side of chest with arrow pointing upward.   Rub patch adhesive wings for 2 minutes.Remove white label marked "1".  Remove white label marked "2".  Rub patch adhesive wings for 2 additional minutes.   While looking in a mirror, press and release button in center of patch.  A small green light will flash 3-4 times .  This will be your only indicator the monitor has been turned on.     Do not shower for the first 24 hours.  You may shower after the first 24 hours.   Press button if you feel a symptom. You will hear a small click.  Record Date, Time and Symptom in the Patient Log Book.   When you are ready to remove patch, follow instructions on last 2 pages of Patient Log Book.  Stick patch monitor onto last page of Patient  Log Book.   Place Patient Log Book in [employer] box.  Use locking tab on box and tape box closed securely.  The Orange and [employer] box has [employer] postage on it.  Please place in mailbox as soon as possible.  Your physician should have your test results approximately 7 days after the monitor has been mailed back to [facility].   Call [facility] Customer Care at [phone] if you have questions regarding your ZIO XT patch monitor.  Call them immediately if you see an orange light blinking on your monitor.   If your monitor falls off in less than 4 days contact our Monitor department at [phone].  If your monitor becomes loose or falls off after 4 days call [employer] at [phone] for suggestions on securing your monitor.   Nitroglycerin sublingual tablets What is this medicine? NITROGLYCERIN (nye troe GLI ser in) is a type of vasodilator. It relaxes blood vessels, increasing the blood and oxygen supply to your heart. This medicine is used to relieve chest pain caused by angina. It is also used to prevent chest pain before activities like climbing stairs, going outdoors in cold weather, or sexual activity. This medicine may be used  for other purposes; ask your health care provider or pharmacist if you have questions. COMMON BRAND NAME(S): Nitroquick, Nitrostat, Nitrotab What should I tell my health care provider before I take this medicine? They need to know if you have any of these conditions:  anemia  head injury, recent stroke, or bleeding in the brain  liver disease  previous heart attack  an unusual or allergic reaction to nitroglycerin, other medicines, foods, dyes, or preservatives  pregnant or trying to get pregnant  breast-feeding How should I use this medicine? Take this medicine by mouth as needed. Use at the first sign of an angina attack (chest pain or tightness). You can also take this medicine 5 to 10 minutes before an event likely to produce chest pain. Follow  the directions exactly as written on the prescription label. Place one tablet under your tongue and let it dissolve. Do not swallow whole. Replace the dose if you accidentally swallow it. It will help if your mouth is not dry. Saliva around the tablet will help it to dissolve more quickly. Do not eat or drink, smoke or chew tobacco while a tablet is dissolving. Sit down when taking this medicine. In an angina attack, you should feel better within 5 minutes after your first dose. You can take a dose every 5 minutes up to a total of 3 doses. If you do not feel better or feel worse after 1 dose, call 9-1-1 at once. Do not take more than 3 doses in 15 minutes. Your health care provider might give you other directions. Follow those directions if he or she does. Do not take your medicine more often than directed. Talk to your health care provider about the use of this medicine in children. Special care may be needed. Overdosage: If you think you have taken too much of this medicine contact a poison control center or emergency room at once. NOTE: This medicine is only for you. Do not share this medicine with others. What if I miss a dose? This does not apply. This medicine is only used as needed. What may interact with this medicine? Do not take this medicine with any of the following medications:  certain migraine medicines like ergotamine and dihydroergotamine (DHE)  medicines used to treat erectile dysfunction like sildenafil, tadalafil, and vardenafil  riociguat This medicine may also interact with the following medications:  alteplase  aspirin  heparin  medicines for high blood pressure  medicines for mental depression  other medicines used to treat angina  phenothiazines like chlorpromazine, mesoridazine, prochlorperazine, thioridazine This list may not describe all possible interactions. Give your health care provider a list of all the medicines, herbs, non-prescription drugs, or  dietary supplements you use. Also tell them if you smoke, drink alcohol, or use illegal drugs. Some items may interact with your medicine. What should I watch for while using this medicine? Tell your doctor or health care professional if you feel your medicine is no longer working. Keep this medicine with you at all times. Sit or lie down when you take your medicine to prevent falling if you feel dizzy or faint after using it. Try to remain calm. This will help you to feel better faster. If you feel dizzy, take several deep breaths and lie down with your feet propped up, or bend forward with your head resting between your knees. You may get drowsy or dizzy. Do not drive, use machinery, or do anything that needs mental alertness until you know how this drug  affects you. Do not stand or sit up quickly, especially if you are an older patient. This reduces the risk of dizzy or fainting spells. Alcohol can make you more drowsy and dizzy. Avoid alcoholic drinks. Do not treat yourself for coughs, colds, or pain while you are taking this medicine without asking your doctor or health care professional for advice. Some ingredients may increase your blood pressure. What side effects may I notice from receiving this medicine? Side effects that you should report to your doctor or health care professional as soon as possible:  allergic reactions (skin rash, itching or hives; swelling of the face, lips, or tongue)  low blood pressure (dizziness; feeling faint or lightheaded, falls; unusually weak or tired)  low red blood cell counts (trouble breathing; feeling faint; lightheaded, falls; unusually weak or tired) Side effects that usually do not require medical attention (report to your doctor or health care professional if they continue or are bothersome):  facial flushing (redness)  headache  nausea, vomiting This list may not describe all possible side effects. Call your doctor for medical advice about side  effects. You may report side effects to FDA at 1-800-FDA-1088. Where should I keep my medicine? Keep out of the reach of children. Store at room temperature between 20 and 25 degrees C (68 and 77 degrees F). Store in Retail buyer. Protect from light and moisture. Keep tightly closed. Throw away any unused medicine after the expiration date. NOTE: This sheet is a summary. It may not cover all possible information. If you have questions about this medicine, talk to your doctor, pharmacist, or health care provider.  2021 Elsevier/Gold Standard (2018-07-05 16:46:32)      Signed, Little Ishikawa, MD  11/12/2020 6:22 PM    [facility]

## 2020-11-10 ENCOUNTER — Other Ambulatory Visit: Payer: Self-pay | Admitting: Family Medicine

## 2020-11-10 DIAGNOSIS — E119 Type 2 diabetes mellitus without complications: Secondary | ICD-10-CM

## 2020-11-12 ENCOUNTER — Other Ambulatory Visit: Payer: Self-pay

## 2020-11-12 ENCOUNTER — Encounter: Payer: Self-pay | Admitting: Cardiology

## 2020-11-12 ENCOUNTER — Ambulatory Visit (INDEPENDENT_AMBULATORY_CARE_PROVIDER_SITE_OTHER): Payer: Medicare Other | Admitting: Cardiology

## 2020-11-12 ENCOUNTER — Ambulatory Visit (INDEPENDENT_AMBULATORY_CARE_PROVIDER_SITE_OTHER): Payer: Medicare Other

## 2020-11-12 ENCOUNTER — Encounter: Payer: Self-pay | Admitting: *Deleted

## 2020-11-12 VITALS — BP 132/68 | HR 72 | Ht 76.0 in | Wt 260.0 lb

## 2020-11-12 DIAGNOSIS — R55 Syncope and collapse: Secondary | ICD-10-CM

## 2020-11-12 DIAGNOSIS — R072 Precordial pain: Secondary | ICD-10-CM

## 2020-11-12 DIAGNOSIS — N179 Acute kidney failure, unspecified: Secondary | ICD-10-CM

## 2020-11-12 DIAGNOSIS — E785 Hyperlipidemia, unspecified: Secondary | ICD-10-CM

## 2020-11-12 DIAGNOSIS — Z79899 Other long term (current) drug therapy: Secondary | ICD-10-CM

## 2020-11-12 DIAGNOSIS — I10 Essential (primary) hypertension: Secondary | ICD-10-CM | POA: Diagnosis not present

## 2020-11-12 DIAGNOSIS — I447 Left bundle-branch block, unspecified: Secondary | ICD-10-CM

## 2020-11-12 MED ORDER — NITROGLYCERIN 0.4 MG SL SUBL: 0.4000 mg | SUBLINGUAL_TABLET | SUBLINGUAL | 3 refills | Status: DC | PRN

## 2020-11-12 NOTE — Patient Instructions (Signed)
Medication Instructions:  Nitroglycerin: Place 1 tablet (0.4 mg total) under the tongue every 5 (five) minutes as needed for chest pain ( no more than 3 dose).   *If you need a refill on your cardiac medications before your next appointment, please call your pharmacy*   Lab Work: Your physician recommends lab work today (BMP,Lipid).  If you have labs (blood work) drawn today and your tests are completely normal, you will receive your results only by: Marland Kitchen MyChart Message (if you have MyChart) OR . A paper copy in the mail If you have any lab test that is abnormal or we need to change your treatment, we will call you to review the results.   Testing/Procedures: Your physician has requested that you have an echocardiogram. Echocardiography is a painless test that uses sound waves to create images of your heart. It provides your doctor with information about the size and shape of your heart and how well your heart's chambers and valves are working. This procedure takes approximately one hour. There are no restrictions for this procedure. [address]  Your physician has requested that you have a lexiscan myoview. For further information please visit [email or web address] Please follow instruction sheet, as given. [address]  Our physician has recommended that you wear an 14  DAY ZIO-PATCH monitor. The Zio patch cardiac monitor continuously records heart rhythm data for up to 14 days, this is for patients being evaluated for multiple types heart rhythms. For the first 24 hours post application, please avoid getting the Zio monitor wet in the shower or by excessive sweating during exercise. After that, feel free to carry on with regular activities. Keep soaps and lotions away from the ZIO XT Patch.   Someone from our office will call to verify address and mail monitor.   Follow-Up: At [facility], you and your health needs are our priority.  As part of our  continuing mission to provide you with exceptional heart care, we have created designated Provider Care Teams.  These Care Teams include your primary Cardiologist (physician) and Advanced Practice Providers (APPs -  Physician Assistants and Nurse Practitioners) who all work together to provide you with the care you need, when you need it.  We recommend signing up for the patient portal called "MyChart".  Sign up information is provided on this After Visit Summary.  MyChart is used to connect with patients for Virtual Visits (Telemedicine).  Patients are able to view lab/test results, encounter notes, upcoming appointments, etc.  Non-urgent messages can be sent to your provider as well.   To learn more about what you can do with MyChart, go to [email or web address].    Your next appointment:   3 month(s)  The format for your next appointment:   In Person  Provider:   Epifanio Lesches, MD  You are scheduled for a Myocardial Perfusion Imaging Study on.  Please arrive 15 minutes prior to your appointment time for registration and insurance purposes.  The test will take approximately 3 to 4 hours to complete; you may bring reading material.  If someone comes with you to your appointment, they will need to remain in the main lobby due to limited space in the testing area. **If you are pregnant or breastfeeding, please notify the nuclear lab prior to your appointment**  How to prepare for your Myocardial Perfusion Test: . Do not eat or drink 3 hours prior to your test, except you may have water. Marland Kitchen  Do not consume products containing caffeine (regular or decaffeinated) 12 hours prior to your test. (ex: coffee, chocolate, sodas, tea). . Do bring a list of your current medications with you.  If not listed below, you may take your medications as normal. . Do wear comfortable clothes (no dresses or overalls) and walking shoes, tennis shoes preferred (No heels or open toe shoes are allowed). . Do NOT  wear cologne, perfume, aftershave, or lotions (deodorant is allowed). . If these instructions are not followed, your test will have to be rescheduled.  Please report to [address] for your test.  If you have questions or concerns about your appointment, you can call the Nuclear Lab at [phone].  If you cannot keep your appointment, please provide 24 hours notification to the Nuclear Lab, to avoid a possible $50 charge to your account.  ZIO XT- Long Term Monitor Instructions   Your physician has requested you wear your ZIO patch monitor__14_____days.   This is a single patch monitor.  [employer] supplies one patch monitor per enrollment.  Additional stickers are not available.   Please do not apply patch if you will be having a Nuclear Stress Test, Echocardiogram, Cardiac CT, MRI, or Chest Xray during the time frame you would be wearing the monitor. The patch cannot be worn during these tests.  You cannot remove and re-apply the ZIO XT patch monitor.   Your ZIO patch monitor will be sent USPS Priority mail from [facility] directly to your home address. The monitor may also be mailed to a PO BOX if home delivery is not available.   It may take 3-5 days to receive your monitor after you have been enrolled.   Once you have received you monitor, please review enclosed instructions.  Your monitor has already been registered assigning a specific monitor serial # to you.   Applying the monitor   Shave hair from upper left chest.   Hold abrader disc by orange tab.  Rub abrader in 40 strokes over left upper chest as indicated in your monitor instructions.   Clean area with 4 enclosed alcohol pads .  Use all pads to assure are is cleaned thoroughly.  Let dry.   Apply patch as indicated in monitor instructions.  Patch will be place under collarbone on left side of chest with arrow pointing upward.   Rub patch adhesive wings for 2 minutes.Remove white label marked "1".   Remove white label marked "2".  Rub patch adhesive wings for 2 additional minutes.   While looking in a mirror, press and release button in center of patch.  A small green light will flash 3-4 times .  This will be your only indicator the monitor has been turned on.     Do not shower for the first 24 hours.  You may shower after the first 24 hours.   Press button if you feel a symptom. You will hear a small click.  Record Date, Time and Symptom in the Patient Log Book.   When you are ready to remove patch, follow instructions on last 2 pages of Patient Log Book.  Stick patch monitor onto last page of Patient Log Book.   Place Patient Log Book in [place] box.  Use locking tab on box and tape box closed securely.  The [employer] and [employer] has [employer] on it.  Please place in mailbox as soon as possible.  Your physician should have your test results approximately 7  days after the monitor has been mailed back to [place].   Call [facility] Customer Care at [phone] if you have questions regarding your ZIO XT patch monitor.  Call them immediately if you see an orange light blinking on your monitor.   If your monitor falls off in less than 4 days contact our Monitor department at [phone].  If your monitor becomes loose or falls off after 4 days call [employer] at [phone] for suggestions on securing your monitor.   Nitroglycerin sublingual tablets What is this medicine? NITROGLYCERIN (nye troe GLI ser in) is a type of vasodilator. It relaxes blood vessels, increasing the blood and oxygen supply to your heart. This medicine is used to relieve chest pain caused by angina. It is also used to prevent chest pain before activities like climbing stairs, going outdoors in cold weather, or sexual activity. This medicine may be used for other purposes; ask your health care provider or pharmacist if you have questions. COMMON BRAND NAME(S): Nitroquick, Nitrostat, Nitrotab What should  I tell my health care provider before I take this medicine? They need to know if you have any of these conditions:  anemia  head injury, recent stroke, or bleeding in the brain  liver disease  previous heart attack  an unusual or allergic reaction to nitroglycerin, other medicines, foods, dyes, or preservatives  pregnant or trying to get pregnant  breast-feeding How should I use this medicine? Take this medicine by mouth as needed. Use at the first sign of an angina attack (chest pain or tightness). You can also take this medicine 5 to 10 minutes before an event likely to produce chest pain. Follow the directions exactly as written on the prescription label. Place one tablet under your tongue and let it dissolve. Do not swallow whole. Replace the dose if you accidentally swallow it. It will help if your mouth is not dry. Saliva around the tablet will help it to dissolve more quickly. Do not eat or drink, smoke or chew tobacco while a tablet is dissolving. Sit down when taking this medicine. In an angina attack, you should feel better within 5 minutes after your first dose. You can take a dose every 5 minutes up to a total of 3 doses. If you do not feel better or feel worse after 1 dose, call 9-1-1 at once. Do not take more than 3 doses in 15 minutes. Your health care provider might give you other directions. Follow those directions if he or she does. Do not take your medicine more often than directed. Talk to your health care provider about the use of this medicine in children. Special care may be needed. Overdosage: If you think you have taken too much of this medicine contact a poison control center or emergency room at once. NOTE: This medicine is only for you. Do not share this medicine with others. What if I miss a dose? This does not apply. This medicine is only used as needed. What may interact with this medicine? Do not take this medicine with any of the following  medications:  certain migraine medicines like ergotamine and dihydroergotamine (DHE)  medicines used to treat erectile dysfunction like sildenafil, tadalafil, and vardenafil  riociguat This medicine may also interact with the following medications:  alteplase  aspirin  heparin  medicines for high blood pressure  medicines for mental depression  other medicines used to treat angina  phenothiazines like chlorpromazine, mesoridazine, prochlorperazine, thioridazine This list may not describe all possible interactions. Give  your health care provider a list of all the medicines, herbs, non-prescription drugs, or dietary supplements you use. Also tell them if you smoke, drink alcohol, or use illegal drugs. Some items may interact with your medicine. What should I watch for while using this medicine? Tell your doctor or health care professional if you feel your medicine is no longer working. Keep this medicine with you at all times. Sit or lie down when you take your medicine to prevent falling if you feel dizzy or faint after using it. Try to remain calm. This will help you to feel better faster. If you feel dizzy, take several deep breaths and lie down with your feet propped up, or bend forward with your head resting between your knees. You may get drowsy or dizzy. Do not drive, use machinery, or do anything that needs mental alertness until you know how this drug affects you. Do not stand or sit up quickly, especially if you are an older patient. This reduces the risk of dizzy or fainting spells. Alcohol can make you more drowsy and dizzy. Avoid alcoholic drinks. Do not treat yourself for coughs, colds, or pain while you are taking this medicine without asking your doctor or health care professional for advice. Some ingredients may increase your blood pressure. What side effects may I notice from receiving this medicine? Side effects that you should report to your doctor or health care  professional as soon as possible:  allergic reactions (skin rash, itching or hives; swelling of the face, lips, or tongue)  low blood pressure (dizziness; feeling faint or lightheaded, falls; unusually weak or tired)  low red blood cell counts (trouble breathing; feeling faint; lightheaded, falls; unusually weak or tired) Side effects that usually do not require medical attention (report to your doctor or health care professional if they continue or are bothersome):  facial flushing (redness)  headache  nausea, vomiting This list may not describe all possible side effects. Call your doctor for medical advice about side effects. You may report side effects to FDA at 1-800-FDA-1088. Where should I keep my medicine? Keep out of the reach of children. Store at room temperature between 20 and 25 degrees C (68 and 77 degrees F). Store in Retail buyer. Protect from light and moisture. Keep tightly closed. Throw away any unused medicine after the expiration date. NOTE: This sheet is a summary. It may not cover all possible information. If you have questions about this medicine, talk to your doctor, pharmacist, or health care provider.  2021 Elsevier/Gold Standard (2018-07-05 16:46:32)

## 2020-11-12 NOTE — Progress Notes (Signed)
Patient ID: Keith Schwartz, male   DOB: 1951-09-14, 70 y.o.   MRN: [id number] Patient enrolled for 14 day ZIO XT long term holter monitor to be shipped to his home.

## 2020-11-13 LAB — BASIC METABOLIC PANEL
BUN/Creatinine Ratio: 18 (ref 10–24)
BUN: 25 mg/dL (ref 8–27)
CO2: 23 mmol/L (ref 20–29)
Calcium: 10.5 mg/dL — ABNORMAL HIGH (ref 8.6–10.2)
Chloride: 100 mmol/L (ref 96–106)
Creatinine, Ser: 1.4 mg/dL — ABNORMAL HIGH (ref 0.76–1.27)
GFR calc Af Amer: 59 mL/min/{1.73_m2} — ABNORMAL LOW (ref >59)
GFR calc non Af Amer: 51 mL/min/{1.73_m2} — ABNORMAL LOW (ref >59)
Glucose: 140 mg/dL — ABNORMAL HIGH (ref 65–99)
Potassium: 4.5 mmol/L (ref 3.5–5.2)
Sodium: 140 mmol/L (ref 134–144)

## 2020-11-13 LAB — LIPID PANEL
Chol/HDL Ratio: 5.8 ratio — ABNORMAL HIGH (ref 0.0–5.0)
Cholesterol, Total: 185 mg/dL (ref 100–199)
HDL: 32 mg/dL — ABNORMAL LOW (ref >39)
LDL Chol Calc (NIH): 109 mg/dL — ABNORMAL HIGH (ref 0–99)
Triglycerides: 254 mg/dL — ABNORMAL HIGH (ref 0–149)
VLDL Cholesterol Cal: 44 mg/dL — ABNORMAL HIGH (ref 5–40)

## 2020-11-18 DIAGNOSIS — R55 Syncope and collapse: Secondary | ICD-10-CM

## 2020-11-28 NOTE — Addendum Note (Signed)
Addended by: Epifanio Lesches on: 11/28/2020 03:09 PM   Modules accepted: Orders

## 2020-11-28 NOTE — Addendum Note (Signed)
Addended by: Johney Frame A on: 11/28/2020 03:06 PM   Modules accepted: Orders

## 2020-12-05 ENCOUNTER — Other Ambulatory Visit (HOSPITAL_COMMUNITY): Payer: TRICARE For Life (TFL)

## 2020-12-05 ENCOUNTER — Encounter (HOSPITAL_COMMUNITY): Payer: TRICARE For Life (TFL)

## 2020-12-06 ENCOUNTER — Ambulatory Visit
Admission: EM | Admit: 2020-12-06 | Discharge: 2020-12-06 | Disposition: A | Discharge status: Home / Self Care | Payer: Medicare Other | Attending: Family Medicine | Admitting: Family Medicine

## 2020-12-06 ENCOUNTER — Encounter: Payer: Self-pay | Admitting: Emergency Medicine

## 2020-12-06 ENCOUNTER — Other Ambulatory Visit: Payer: Self-pay

## 2020-12-06 DIAGNOSIS — H1033 Unspecified acute conjunctivitis, bilateral: Secondary | ICD-10-CM | POA: Diagnosis not present

## 2020-12-06 MED ORDER — POLYMYXIN B-TRIMETHOPRIM 10000-0.1 UNIT/ML-% OP SOLN: 1.0000 [drp] | OPHTHALMIC | 0 refills | Status: DC

## 2020-12-06 NOTE — Discharge Instructions (Addendum)
Use the antibiotic eyedrops as prescribed.    One drop every 4 hours to both eyes while awake.  Follow-up with your eye doctor for a recheck in 1 to 2 days if your symptoms are not improving.    Go to the emergency department if you have acute eye pain, changes in your vision, or other concerning symptoms.

## 2020-12-06 NOTE — ED Triage Notes (Signed)
Eye red and painful with swelling since yesterday

## 2020-12-06 NOTE — ED Provider Notes (Signed)
[facility]   [id number] 12/06/20 Arrival Time: 1147  CC: EYE REDNESS  SUBJECTIVE:  Keith Schwartz is a 70 y.o. male who presents with complaint of bilateral eye redness that began 2 days ago.  Reports that he has been using artificial tears to help relieve irritation in both eyes as well.  Denies a precipitating event, trauma, or close contacts with similar symptoms.  There are no aggravating or alleviating factors. Denies similar symptoms in the past. Denies fever, chills, nausea, vomiting, eye pain, painful eye movements, halos, discharge, itching, vision changes, double vision, FB sensation, periorbital erythema. Denies contact lens use.    ROS: As per HPI.  All other pertinent ROS negative.     Past Medical History:  Diagnosis Date  . Diabetes mellitus without complication (HCC) 2002  . Hypertension 2002   Past Surgical History:  Procedure Laterality Date  . APPENDECTOMY  1970  . JOINT REPLACEMENT  2012   rt knee  . VASECTOMY  1986   Allergies  Allergen Reactions  . Glipizide   . Penicillins    No current facility-administered medications on file prior to encounter.   Current Outpatient Medications on File Prior to Encounter  Medication Sig Dispense Refill  . albuterol (PROVENTIL HFA;VENTOLIN HFA) 108 (90 Base) MCG/ACT inhaler Inhale 2 puffs into the lungs every 6 (six) hours as needed for wheezing or shortness of breath. 1 Inhaler 0  . allopurinol (ZYLOPRIM) 300 MG tablet Take 1 tablet (300 mg total) by mouth daily. 30 tablet 5  . aspirin 325 MG tablet Take 325 mg by mouth daily.    Marland Kitchen atorvastatin (LIPITOR) 80 MG tablet TAKE ONE-HALF (1/2) TABLET DAILY 90 tablet 1  . clotrimazole-betamethasone (LOTRISONE) cream Apply 1 application topically 2 (two) times daily. 30 g 2  . colchicine 0.6 MG tablet At sign of gout flare: Take 2. One hour later--Take 1. Then, take 1 twice a day until flare resolves 30 tablet 2  . diclofenac (VOLTAREN) 50 MG EC tablet Take 1  tablet (50 mg total) by mouth 3 (three) times daily. 90 tablet 0  . glimepiride (AMARYL) 2 MG tablet     . Glucose Blood (BLOOD GLUCOSE TEST STRIPS) STRP Please dispense based on patient and insurance preference. Use as directed to monitor FSBS 3x daily. Dx: Fletcher.Ros. 200 each 1  . hydrOXYzine (ATARAX/VISTARIL) 25 MG tablet Take 1 tablet (25 mg total) by mouth 3 (three) times daily as needed. 90 tablet 1  . metFORMIN (GLUCOPHAGE) 1000 MG tablet TAKE 1 TABLET TWICE A DAY WITH MEALS 60 tablet 0  . metFORMIN (GLUCOPHAGE) 1000 MG tablet TAKE ONE TABLET BY MOUTH TWICE DAILY WITH  A  MEAL 180 tablet 0  . mometasone (ELOCON) 0.1 % cream Apply 1 application topically 3 (three) times daily as needed. 50 g 1  . Multiple Vitamin (MULTIVITAMIN) tablet Take 1 tablet by mouth daily. MEN'S ONE A DAY 50+    . nitroGLYCERIN (NITROSTAT) 0.4 MG SL tablet Place 1 tablet (0.4 mg total) under the tongue every 5 (five) minutes as needed for chest pain. 25 tablet 3  . sildenafil (VIAGRA) 100 MG tablet Take 0.5 tablets (50 mg total) by mouth daily as needed for erectile dysfunction. Reported on 10/27/2015 10 tablet 11  . Specialty Vitamins Products (MAGNESIUM, AMINO ACID CHELATE,) 133 MG tablet Take 1 tablet by mouth 2 (two) times daily.    Marland Kitchen testosterone cypionate (DEPO-TESTOSTERONE) 200 MG/ML injection Inject 1 mL (200 mg total) into the muscle every  14 (fourteen) days. 10 mL 0  . TRULICITY 1.5 MG/0.5ML SOPN INJECT 1 PEN UNDER THE SKIN ONCE A WEEK 6 mL 3  . vardenafil (LEVITRA) 20 MG tablet Take 20 mg by mouth. Reported on 10/27/2015    . vitamin E 400 UNIT capsule Take 400 Units by mouth daily.    Marland Kitchen zinc gluconate 50 MG tablet Take 50 mg by mouth daily.     Social History   Socioeconomic History  . Marital status: Married    Spouse name: Not on file  . Number of children: Not on file  . Years of education: Not on file  . Highest education level: Not on file  Occupational History  . Not on file  Tobacco Use  . Smoking  status: Never Smoker  . Smokeless tobacco: Never Used  Substance and Sexual Activity  . Alcohol use: Yes    Alcohol/week: 1.0 standard drink    Types: 1 Cans of beer per week  . Drug use: Never  . Sexual activity: Yes    Birth control/protection: Surgical  Other Topics Concern  . Not on file  Social History Narrative  . Not on file   Social Determinants of Health   Financial Resource Strain: Not on file  Food Insecurity: Not on file  Transportation Needs: Not on file  Physical Activity: Not on file  Stress: Not on file  Social Connections: Not on file  Intimate Partner Violence: Not on file   Family History  Problem Relation Age of Onset  . Arthritis Mother   . Asthma Mother   . Hearing loss Mother   . Vision loss Mother   . Heart disease Father   . Hyperlipidemia Father   . Hypertension Father   . Early death Sister     OBJECTIVE:        Vitals:   12/06/20 1246  BP: (!) 154/85  Pulse: 75  Resp: 18  Temp: 97.8 F (36.6 C)  TempSrc: Oral  SpO2: 96%    General appearance: alert; no distress Eyes: Bilateral conjunctivitis with yellow discharge from bilateral tear ducts PERRL; EOMI without discomfort;  no obvious drainage; lid everted without obvious FB;   Neck: supple Lungs: clear to auscultation bilaterally Heart: regular rate and rhythm Skin: warm and dry Psychological: alert and cooperative; normal mood and affect   ASSESSMENT & PLAN:  1. Acute bacterial conjunctivitis of both eyes     Meds ordered this encounter  Medications  . trimethoprim-polymyxin b (POLYTRIM) ophthalmic solution    Sig: Place 1 drop into both eyes every 4 (four) hours.    Dispense:  10 mL    Refill:  0    Order Specific Question:   Supervising Provider    Answer:   Merrilee Jansky [[id number]]     Conjunctivitis Use eye drops as prescribed and to completion Dispose of old contacts and wear glasses until you have finished course of antibiotic eye drops Wash pillow  cases, wash hands regularly with soap and water, avoid touching your face and eyes, wash door handles, light switches, remotes and other objects you frequently touch Return or follow up with PCP if symptoms persists such as fever, chills, redness, swelling, eye pain, painful eye movements, vision changes  Reviewed expectations re: course of current medical issues. Questions answered. Outlined signs and symptoms indicating need for more acute intervention. Patient verbalized understanding. After Visit Summary given.   Moshe Cipro, NP 12/06/20 1335

## 2020-12-08 ENCOUNTER — Telehealth (HOSPITAL_COMMUNITY): Payer: Self-pay

## 2020-12-08 NOTE — Telephone Encounter (Signed)
Spoke with the patient, detailed instructions given. He stated that he would be here for his test. Asked to call back with any questions. S.Moreen Piggott EMTP 

## 2020-12-09 ENCOUNTER — Other Ambulatory Visit: Payer: Self-pay

## 2020-12-09 ENCOUNTER — Ambulatory Visit (HOSPITAL_BASED_OUTPATIENT_CLINIC_OR_DEPARTMENT_OTHER): Payer: Medicare Other

## 2020-12-09 ENCOUNTER — Ambulatory Visit (HOSPITAL_COMMUNITY): Payer: Medicare Other | Attending: Internal Medicine

## 2020-12-09 VITALS — Ht 76.0 in | Wt 260.0 lb

## 2020-12-09 DIAGNOSIS — I447 Left bundle-branch block, unspecified: Secondary | ICD-10-CM | POA: Insufficient documentation

## 2020-12-09 DIAGNOSIS — R072 Precordial pain: Secondary | ICD-10-CM

## 2020-12-09 DIAGNOSIS — I10 Essential (primary) hypertension: Secondary | ICD-10-CM | POA: Diagnosis not present

## 2020-12-09 DIAGNOSIS — R55 Syncope and collapse: Secondary | ICD-10-CM | POA: Insufficient documentation

## 2020-12-09 LAB — MYOCARDIAL PERFUSION IMAGING
LV dias vol: 267 mL (ref 62–150)
LV sys vol: 202 mL
Peak HR: 84 {beats}/min
Rest HR: 72 {beats}/min
SDS: 2
SRS: 11
SSS: 15
TID: 1.1

## 2020-12-09 LAB — ECHOCARDIOGRAM COMPLETE
Area-P 1/2: 2.54 cm2
Height: 76 in
S' Lateral: 3.8 cm
Weight: 4160 oz

## 2020-12-09 MED ORDER — TECHNETIUM TC 99M TETROFOSMIN IV KIT
31.6000 | PACK | Freq: Once | INTRAVENOUS | Status: AC | PRN
  Administered 2020-12-09: 31.6 via INTRAVENOUS
  Filled 2020-12-09: qty 32

## 2020-12-09 MED ORDER — REGADENOSON 0.4 MG/5ML IV SOLN
0.4000 mg | Freq: Once | INTRAVENOUS | Status: AC
  Administered 2020-12-09: 0.4 mg via INTRAVENOUS

## 2020-12-09 MED ORDER — PERFLUTREN LIPID MICROSPHERE
1.0000 mL | INTRAVENOUS | Status: AC | PRN
  Administered 2020-12-09: 3 mL via INTRAVENOUS

## 2020-12-09 MED ORDER — TECHNETIUM TC 99M TETROFOSMIN IV KIT
10.9000 | PACK | Freq: Once | INTRAVENOUS | Status: AC | PRN
  Administered 2020-12-09: 10.9 via INTRAVENOUS
  Filled 2020-12-09: qty 11

## 2020-12-10 ENCOUNTER — Encounter: Payer: Self-pay | Admitting: Family Medicine

## 2020-12-11 ENCOUNTER — Other Ambulatory Visit: Payer: Self-pay | Admitting: Family Medicine

## 2020-12-11 MED ORDER — FLUCONAZOLE 100 MG PO TABS: 100.0000 mg | ORAL_TABLET | Freq: Every day | ORAL | 0 refills | Status: DC

## 2020-12-11 NOTE — Progress Notes (Signed)
Cardiology Clinic Note   Patient Name: Keith Schwartz Date of Encounter: 12/12/2020  Primary Care Provider:  Donita Brooks, MD Primary Cardiologist:  Little Ishikawa, MD  Patient Profile    Keith Schwartz 70 year old male presents the clinic today for follow-up evaluation of his coronary artery disease.  Past Medical History    Past Medical History:  Diagnosis Date  . Diabetes mellitus without complication (HCC) 2002  . Hypertension 2002   Past Surgical History:  Procedure Laterality Date  . APPENDECTOMY  1970  . JOINT REPLACEMENT  2012   rt knee  . VASECTOMY  1986    Allergies  Allergies  Allergen Reactions  . Glipizide   . Penicillins     History of Present Illness    Keith Schwartz is a PMH of type 2 diabetes, hypertension, AKI, hyperlipidemia, and coronary artery disease. He was referred by Dr. Tanya Nones for an evaluation of his syncope. He reported an episode of syncope 10/16/2020. He reports that he had bronchitis at that time he was taking DayQuil that morning. In addition to taking his lisinopril. He was at [facility] and had checked his blood pressure. He noted blood pressures in the 90s over 60s. Subsequently he was at checkout and suddenly passed out from a standing position. He denied prodromal symptoms. He reported that he fell to the ground but did not hit his head. He felt he was unconscious for a few seconds. EMS was contacted and reported that his blood pressure was down to 70s. He received IV fluids and his blood pressure increased to the 90s by their arrival to the emergency department. His EKG at that time showed left bundle branch block. His creatinine was elevated at 1.75. He reported that he had not taken his lisinopril since he was discharged from the emergency department. He denied lightheadedness and further episodes of syncope. He denied palpitations. Reported that he had been having episodes of chest discomfort. He explained that the  pressure was in the center of his upper chest and occurred with exertion. He reported that he would continue to exert himself and his symptoms would subside. It would typically last for 1-2 minutes. He had no smoking history. He reported that his father did have an MI and died of CHF in his 40s.   He underwent nuclear stress test 12/09/2020. It showed that his ejection fraction was significantly reduced at 24%, no T wave or ST segment deviation noted during the test. A large defect of severe severity is present in the basal inferior, mid anterior, mid inferior, and apical anterior portions. Findings were consistent with prior MI. High risk study. A left and right heart catheter recommended.  He presents the clinic today for follow-up evaluation and scheduling. He states he does notice some right knee pain and generalized swelling related to his right knee.  We reviewed his nuclear stress test results, risk factors related to cardiac catheterization, and recovery timeframe.  He had his wife both expressed understanding.  We used shared decision making to discuss and agree on proceeding with cardiac catheterization.  He reports that he has been evaluated for peripheral vascular disease in his lower extremities in the past and was told that he had poor blood flow bilaterally.  However, his great toe did heal well.  He reports that he is compliant with his medications.  He remains very physically active doing yard work.  I have instructed him to do only light physical activity leading up to  the cardiac catheterization.  We will proceed with cardiac catheterization and have him follow-up after his catheterization.  Today he denies chest pain, shortness of breath, lower extremity edema, fatigue, palpitations, melena, hematuria, hemoptysis, diaphoresis, weakness, presyncope, syncope, orthopnea, and PND.    Home Medications    Prior to Admission medications   Medication Sig Start Date End Date Taking?  Authorizing Provider  albuterol (PROVENTIL HFA;VENTOLIN HFA) 108 (90 Base) MCG/ACT inhaler Inhale 2 puffs into the lungs every 6 (six) hours as needed for wheezing or shortness of breath. 01/27/18   Danelle Berry, PA-C  allopurinol (ZYLOPRIM) 300 MG tablet Take 1 tablet (300 mg total) by mouth daily. 05/24/17   Dorena Bodo, PA-C  aspirin 325 MG tablet Take 325 mg by mouth daily.    [provider]  atorvastatin (LIPITOR) 80 MG tablet TAKE ONE-HALF (1/2) TABLET DAILY 12/02/16   Donita Brooks, MD  clotrimazole-betamethasone (LOTRISONE) cream Apply 1 application topically 2 (two) times daily. 05/22/20   Donita Brooks, MD  colchicine 0.6 MG tablet At sign of gout flare: Take 2. One hour later--Take 1. Then, take 1 twice a day until flare resolves 02/10/17   Allayne Butcher B, PA-C  diclofenac (VOLTAREN) 50 MG EC tablet Take 1 tablet (50 mg total) by mouth 3 (three) times daily. 02/03/17   Dorena Bodo, PA-C  glimepiride (AMARYL) 2 MG tablet  12/28/17   [provider]  Glucose Blood (BLOOD GLUCOSE TEST STRIPS) STRP Please dispense based on patient and insurance preference. Use as directed to monitor FSBS 3x daily. Dx: [id number]. 02/06/18   Danelle Berry, PA-C  hydrOXYzine (ATARAX/VISTARIL) 25 MG tablet Take 1 tablet (25 mg total) by mouth 3 (three) times daily as needed. 11/24/15   Donita Brooks, MD  metFORMIN (GLUCOPHAGE) 1000 MG tablet TAKE 1 TABLET TWICE A DAY WITH MEALS 12/06/16   Allayne Butcher B, PA-C  metFORMIN (GLUCOPHAGE) 1000 MG tablet TAKE ONE TABLET BY MOUTH TWICE DAILY WITH  A  MEAL 06/05/18   Dixon, Mary B, PA-C  mometasone (ELOCON) 0.1 % cream Apply 1 application topically 3 (three) times daily as needed. 07/05/19   Donita Brooks, MD  Multiple Vitamin (MULTIVITAMIN) tablet Take 1 tablet by mouth daily. MEN'S ONE A DAY 50+    [provider]  nitroGLYCERIN (NITROSTAT) 0.4 MG SL tablet Place 1 tablet (0.4 mg total) under the tongue every 5 (five) minutes as needed for chest  pain. 11/12/20 02/10/21  Little Ishikawa, MD  sildenafil (VIAGRA) 100 MG tablet Take 0.5 tablets (50 mg total) by mouth daily as needed for erectile dysfunction. Reported on 10/27/2015 11/01/19   Donita Brooks, MD  Specialty Vitamins Products (MAGNESIUM, AMINO ACID CHELATE,) 133 MG tablet Take 1 tablet by mouth 2 (two) times daily.    [provider]  testosterone cypionate (DEPO-TESTOSTERONE) 200 MG/ML injection Inject 1 mL (200 mg total) into the muscle every 14 (fourteen) days. 05/16/17   Dorena Bodo, PA-C  trimethoprim-polymyxin b (POLYTRIM) ophthalmic solution Place 1 drop into both eyes every 4 (four) hours. 12/06/20   Moshe Cipro, NP  TRULICITY 1.5 MG/0.5ML SOPN INJECT 1 PEN UNDER THE SKIN ONCE A WEEK 11/10/20   Donita Brooks, MD  vardenafil (LEVITRA) 20 MG tablet Take 20 mg by mouth. Reported on 10/27/2015    [provider]  vitamin E 400 UNIT capsule Take 400 Units by mouth daily.    [provider]  zinc gluconate 50 MG tablet Take  50 mg by mouth daily.    [provider]    Family History    Family History  Problem Relation Age of Onset  . Arthritis Mother   . Asthma Mother   . Hearing loss Mother   . Vision loss Mother   . Heart disease Father   . Hyperlipidemia Father   . Hypertension Father   . Early death Sister    He indicated that his mother is alive. He indicated that his father is deceased. He indicated that two of his three sisters are alive. He indicated that his maternal grandmother is deceased. He indicated that his maternal grandfather is deceased. He indicated that his paternal grandmother is deceased. He indicated that his paternal grandfather is deceased.  Social History    Social History   Socioeconomic History  . Marital status: Married    Spouse name: Not on file  . Number of children: Not on file  . Years of education: Not on file  . Highest education level: Not on file  Occupational History  .  Not on file  Tobacco Use  . Smoking status: Never Smoker  . Smokeless tobacco: Never Used  Substance and Sexual Activity  . Alcohol use: Yes    Alcohol/week: 1.0 standard drink    Types: 1 Cans of beer per week  . Drug use: Never  . Sexual activity: Yes    Birth control/protection: Surgical  Other Topics Concern  . Not on file  Social History Narrative  . Not on file   Social Determinants of Health   Financial Resource Strain: Not on file  Food Insecurity: Not on file  Transportation Needs: Not on file  Physical Activity: Not on file  Stress: Not on file  Social Connections: Not on file  Intimate Partner Violence: Not on file     Review of Systems    General:  No chills, fever, night sweats or weight changes.  Cardiovascular:  No chest pain, dyspnea on exertion, edema, orthopnea, palpitations, paroxysmal nocturnal dyspnea. Dermatological: No rash, lesions/masses Respiratory: No cough, dyspnea Urologic: No hematuria, dysuria Abdominal:   No nausea, vomiting, diarrhea, bright red blood per rectum, melena, or hematemesis Neurologic:  No visual changes, wkns, changes in mental status. All other systems reviewed and are otherwise negative except as noted above.  Physical Exam    VS:  BP 132/60   Pulse 86   Ht 6' 4" (1.93 m)   Wt 262 lb 6.4 oz (119 kg)   SpO2 95%   BMI 31.94 kg/m  , BMI Body mass index is 31.94 kg/m. GEN: Well nourished, well developed, in no acute distress. HEENT: normal. Neck: Supple, no JVD, carotid bruits, or masses. Cardiac: RRR, no murmurs, rubs, or gallops. No clubbing, cyanosis, edema.  Radials/DP/PT 2+ and equal bilaterally.  Respiratory:  Respirations regular and unlabored, clear to auscultation bilaterally. GI: Soft, nontender, nondistended, BS + x 4. MS: no deformity or atrophy. Skin: warm and dry, no rash. Neuro:  Strength and sensation are intact. Psych: Normal affect.  Accessory Clinical Findings    Recent Labs: 10/16/2020: ALT  26; Hemoglobin 14.8; Platelets 128 11/12/2020: BUN 25; Creatinine, Ser 1.40; Potassium 4.5; Sodium 140   Recent Lipid Panel    Component Value Date/Time   CHOL 185 11/12/2020 1528   TRIG 254 (H) 11/12/2020 1528   HDL 32 (L) 11/12/2020 1528   CHOLHDL 5.8 (H) 11/12/2020 1528   LDLCALC 109 (H) 11/12/2020 1528    ECG personally reviewed by   me today-none today.  Nuclear stress test 12/09/2020  Nuclear stress EF: 24%.  The left ventricular ejection fraction is severely decreased (<30%).  No T wave inversion was noted during stress.  There was no ST segment deviation noted during stress.  Defect 1: There is a large defect of severe severity present in the basal inferior, mid anterior, mid inferior, apical anterior, apical inferior and apex location.  Findings consistent with prior myocardial infarction.  This is a high risk study.   Large size, severe severity fixed perfusion defect involving the inferior, apical and mid to distal anterior walls, with preservation of the lateral wall, suggestive of extensive infarct (SSS 15, SRS 16, SDS -1). LVEF 24% with severe hypokinesis to akinesis of the above mentioned walls. This is a high risk study. Further evaluation with cardiac catheterization is recommended.    Assessment & Plan   1. Chest pain-denies further episodes of chest discomfort or pressure. Nuclear stress test showed EF 24% and high risk. Results reviewed. Details above. Order left and right heart cath  Shared Decision Making/Informed Consent The risks [stroke (1 in 1000), death (1 in 1000), kidney failure [usually temporary] (1 in 500), bleeding (1 in 200), allergic reaction [possibly serious] (1 in 200)], benefits (diagnostic support and management of coronary artery disease) and alternatives of a cardiac catheterization were discussed in detail with Mr. Lewan and he is willing to proceed.   Essential hypertension-BP today 132/60. Well-controlled at home. Lisinopril  held during last office visit. Continue to monitor Heart healthy low-sodium diet  Syncope-no further episodes of presyncope, syncope, or lightheadedness. Felt to be related to dehydration. Reports increase p.o. hydration. Continue to monitor Maintain p.o. hydration  Hyperlipidemia-11/12/2020: Cholesterol, Total 185; HDL 32; LDL Chol Calc (NIH) 109; Triglycerides 254 Continue atorvastatin Heart healthy low-sodium high-fiber diet   Disposition: Follow-up with Dr. Bjorn Pippin after cardiac catheterization  Thomasene Ripple. Takeyah Wieman NP-C    12/12/2020, 3:56 PM [facility] [address] Office [phone] Fax [phone]  Notice: This dictation was prepared with Dragon dictation along with smaller phrase technology. Any transcriptional errors that result from this process are unintentional and may not be corrected upon review.  I spent 15 minutes examining this patient, reviewing medications, and using patient centered shared decision making involving her cardiac care.  Prior to her visit I spent greater than 20 minutes reviewing her past medical history,  medications, and prior cardiac tests.

## 2020-12-11 NOTE — H&P (View-Only) (Signed)
Cardiology Clinic Note   Patient Name: Keith Schwartz Date of Encounter: 12/12/2020  Primary Care Provider:  Donita Brooks, MD Primary Cardiologist:  Little Ishikawa, MD  Patient Profile    Keith Schwartz 70 year old male presents the clinic today for follow-up evaluation of his coronary artery disease.  Past Medical History    Past Medical History:  Diagnosis Date  . Diabetes mellitus without complication (HCC) 2002  . Hypertension 2002   Past Surgical History:  Procedure Laterality Date  . APPENDECTOMY  1970  . JOINT REPLACEMENT  2012   rt knee  . VASECTOMY  1986    Allergies  Allergies  Allergen Reactions  . Glipizide   . Penicillins     History of Present Illness    Keith Schwartz is a PMH of type 2 diabetes, hypertension, AKI, hyperlipidemia, and coronary artery disease. He was referred by Dr. Tanya Schwartz for an evaluation of his syncope. He reported an episode of syncope 10/16/2020. He reports that he had bronchitis at that time he was taking DayQuil that morning. In addition to taking his lisinopril. He was at [facility] and had checked his blood pressure. He noted blood pressures in the 90s over 60s. Subsequently he was at checkout and suddenly passed out from a standing position. He denied prodromal symptoms. He reported that he fell to the ground but did not hit his head. He felt he was unconscious for a few seconds. EMS was contacted and reported that his blood pressure was down to 70s. He received IV fluids and his blood pressure increased to the 90s by their arrival to the emergency department. His EKG at that time showed left bundle branch block. His creatinine was elevated at 1.75. He reported that he had not taken his lisinopril since he was discharged from the emergency department. He denied lightheadedness and further episodes of syncope. He denied palpitations. Reported that he had been having episodes of chest discomfort. He explained that the  pressure was in the center of his upper chest and occurred with exertion. He reported that he would continue to exert himself and his symptoms would subside. It would typically last for 1-2 minutes. He had no smoking history. He reported that his father did have an MI and died of CHF in his 40s.   He underwent nuclear stress test 12/09/2020. It showed that his ejection fraction was significantly reduced at 24%, no T wave or ST segment deviation noted during the test. A large defect of severe severity is present in the basal inferior, mid anterior, mid inferior, and apical anterior portions. Findings were consistent with prior MI. High risk study. A left and right heart catheter recommended.  He presents the clinic today for follow-up evaluation and scheduling. He states he does notice some right knee pain and generalized swelling related to his right knee.  We reviewed his nuclear stress test results, risk factors related to cardiac catheterization, and recovery timeframe.  He had his wife both expressed understanding.  We used shared decision making to discuss and agree on proceeding with cardiac catheterization.  He reports that he has been evaluated for peripheral vascular disease in his lower extremities in the past and was told that he had poor blood flow bilaterally.  However, his great toe did heal well.  He reports that he is compliant with his medications.  He remains very physically active doing yard work.  I have instructed him to do only light physical activity leading up to  the cardiac catheterization.  We will proceed with cardiac catheterization and have him follow-up after his catheterization.  Today he denies chest pain, shortness of breath, lower extremity edema, fatigue, palpitations, melena, hematuria, hemoptysis, diaphoresis, weakness, presyncope, syncope, orthopnea, and PND.    Home Medications    Prior to Admission medications   Medication Sig Start Date End Date Taking?  Authorizing Provider  albuterol (PROVENTIL HFA;VENTOLIN HFA) 108 (90 Base) MCG/ACT inhaler Inhale 2 puffs into the lungs every 6 (six) hours as needed for wheezing or shortness of breath. 01/27/18   Danelle Berry, PA-C  allopurinol (ZYLOPRIM) 300 MG tablet Take 1 tablet (300 mg total) by mouth daily. 05/24/17   Dorena Bodo, PA-C  aspirin 325 MG tablet Take 325 mg by mouth daily.    [provider]  atorvastatin (LIPITOR) 80 MG tablet TAKE ONE-HALF (1/2) TABLET DAILY 12/02/16   Donita Brooks, MD  clotrimazole-betamethasone (LOTRISONE) cream Apply 1 application topically 2 (two) times daily. 05/22/20   Donita Brooks, MD  colchicine 0.6 MG tablet At sign of gout flare: Take 2. One hour later--Take 1. Then, take 1 twice a day until flare resolves 02/10/17   Allayne Butcher B, PA-C  diclofenac (VOLTAREN) 50 MG EC tablet Take 1 tablet (50 mg total) by mouth 3 (three) times daily. 02/03/17   Dorena Bodo, PA-C  glimepiride (AMARYL) 2 MG tablet  12/28/17   [provider]  Glucose Blood (BLOOD GLUCOSE TEST STRIPS) STRP Please dispense based on patient and insurance preference. Use as directed to monitor FSBS 3x daily. Dx: [id number]. 02/06/18   Danelle Berry, PA-C  hydrOXYzine (ATARAX/VISTARIL) 25 MG tablet Take 1 tablet (25 mg total) by mouth 3 (three) times daily as needed. 11/24/15   Donita Brooks, MD  metFORMIN (GLUCOPHAGE) 1000 MG tablet TAKE 1 TABLET TWICE A DAY WITH MEALS 12/06/16   Allayne Butcher B, PA-C  metFORMIN (GLUCOPHAGE) 1000 MG tablet TAKE ONE TABLET BY MOUTH TWICE DAILY WITH  A  MEAL 06/05/18   Dixon, Mary B, PA-C  mometasone (ELOCON) 0.1 % cream Apply 1 application topically 3 (three) times daily as needed. 07/05/19   Donita Brooks, MD  Multiple Vitamin (MULTIVITAMIN) tablet Take 1 tablet by mouth daily. MEN'S ONE A DAY 50+    [provider]  nitroGLYCERIN (NITROSTAT) 0.4 MG SL tablet Place 1 tablet (0.4 mg total) under the tongue every 5 (five) minutes as needed for chest  pain. 11/12/20 02/10/21  Little Ishikawa, MD  sildenafil (VIAGRA) 100 MG tablet Take 0.5 tablets (50 mg total) by mouth daily as needed for erectile dysfunction. Reported on 10/27/2015 11/01/19   Donita Brooks, MD  Specialty Vitamins Products (MAGNESIUM, AMINO ACID CHELATE,) 133 MG tablet Take 1 tablet by mouth 2 (two) times daily.    [provider]  testosterone cypionate (DEPO-TESTOSTERONE) 200 MG/ML injection Inject 1 mL (200 mg total) into the muscle every 14 (fourteen) days. 05/16/17   Dorena Bodo, PA-C  trimethoprim-polymyxin b (POLYTRIM) ophthalmic solution Place 1 drop into both eyes every 4 (four) hours. 12/06/20   Moshe Cipro, NP  TRULICITY 1.5 MG/0.5ML SOPN INJECT 1 PEN UNDER THE SKIN ONCE A WEEK 11/10/20   Donita Brooks, MD  vardenafil (LEVITRA) 20 MG tablet Take 20 mg by mouth. Reported on 10/27/2015    [provider]  vitamin E 400 UNIT capsule Take 400 Units by mouth daily.    [provider]  zinc gluconate 50 MG tablet Take  50 mg by mouth daily.    [provider]    Family History    Family History  Problem Relation Age of Onset  . Arthritis Mother   . Asthma Mother   . Hearing loss Mother   . Vision loss Mother   . Heart disease Father   . Hyperlipidemia Father   . Hypertension Father   . Early death Sister    He indicated that his mother is alive. He indicated that his father is deceased. He indicated that two of his three sisters are alive. He indicated that his maternal grandmother is deceased. He indicated that his maternal grandfather is deceased. He indicated that his paternal grandmother is deceased. He indicated that his paternal grandfather is deceased.  Social History    Social History   Socioeconomic History  . Marital status: Married    Spouse name: Not on file  . Number of children: Not on file  . Years of education: Not on file  . Highest education level: Not on file  Occupational History  .  Not on file  Tobacco Use  . Smoking status: Never Smoker  . Smokeless tobacco: Never Used  Substance and Sexual Activity  . Alcohol use: Yes    Alcohol/week: 1.0 standard drink    Types: 1 Cans of beer per week  . Drug use: Never  . Sexual activity: Yes    Birth control/protection: Surgical  Other Topics Concern  . Not on file  Social History Narrative  . Not on file   Social Determinants of Health   Financial Resource Strain: Not on file  Food Insecurity: Not on file  Transportation Needs: Not on file  Physical Activity: Not on file  Stress: Not on file  Social Connections: Not on file  Intimate Partner Violence: Not on file     Review of Systems    General:  No chills, fever, night sweats or weight changes.  Cardiovascular:  No chest pain, dyspnea on exertion, edema, orthopnea, palpitations, paroxysmal nocturnal dyspnea. Dermatological: No rash, lesions/masses Respiratory: No cough, dyspnea Urologic: No hematuria, dysuria Abdominal:   No nausea, vomiting, diarrhea, bright red blood per rectum, melena, or hematemesis Neurologic:  No visual changes, wkns, changes in mental status. All other systems reviewed and are otherwise negative except as noted above.  Physical Exam    VS:  BP 132/60   Pulse 86   Ht 6\' 4"  (1.93 m)   Wt 262 lb 6.4 oz (119 kg)   SpO2 95%   BMI 31.94 kg/m  , BMI Body mass index is 31.94 kg/m. GEN: Well nourished, well developed, in no acute distress. HEENT: normal. Neck: Supple, no JVD, carotid bruits, or masses. Cardiac: RRR, no murmurs, rubs, or gallops. No clubbing, cyanosis, edema.  Radials/DP/PT 2+ and equal bilaterally.  Respiratory:  Respirations regular and unlabored, clear to auscultation bilaterally. GI: Soft, nontender, nondistended, BS + x 4. MS: no deformity or atrophy. Skin: warm and dry, no rash. Neuro:  Strength and sensation are intact. Psych: Normal affect.  Accessory Clinical Findings    Recent Labs: 10/16/2020: ALT  26; Hemoglobin 14.8; Platelets 128 11/12/2020: BUN 25; Creatinine, Ser 1.40; Potassium 4.5; Sodium 140   Recent Lipid Panel    Component Value Date/Time   CHOL 185 11/12/2020 1528   TRIG 254 (H) 11/12/2020 1528   HDL 32 (L) 11/12/2020 1528   CHOLHDL 5.8 (H) 11/12/2020 1528   LDLCALC 109 (H) 11/12/2020 1528    ECG personally reviewed by  me today-none today.  Nuclear stress test 12/09/2020  Nuclear stress EF: 24%.  The left ventricular ejection fraction is severely decreased (<30%).  No T wave inversion was noted during stress.  There was no ST segment deviation noted during stress.  Defect 1: There is a large defect of severe severity present in the basal inferior, mid anterior, mid inferior, apical anterior, apical inferior and apex location.  Findings consistent with prior myocardial infarction.  This is a high risk study.   Large size, severe severity fixed perfusion defect involving the inferior, apical and mid to distal anterior walls, with preservation of the lateral wall, suggestive of extensive infarct (SSS 15, SRS 16, SDS -1). LVEF 24% with severe hypokinesis to akinesis of the above mentioned walls. This is a high risk study. Further evaluation with cardiac catheterization is recommended.    Assessment & Plan   1. Chest pain-denies further episodes of chest discomfort or pressure. Nuclear stress test showed EF 24% and high risk. Results reviewed. Details above. Order left and right heart cath  Shared Decision Making/Informed Consent The risks [stroke (1 in 1000), death (1 in 1000), kidney failure [usually temporary] (1 in 500), bleeding (1 in 200), allergic reaction [possibly serious] (1 in 200)], benefits (diagnostic support and management of coronary artery disease) and alternatives of a cardiac catheterization were discussed in detail with Mr. Lewan and he is willing to proceed.   Essential hypertension-BP today 132/60. Well-controlled at home. Lisinopril  held during last office visit. Continue to monitor Heart healthy low-sodium diet  Syncope-no further episodes of presyncope, syncope, or lightheadedness. Felt to be related to dehydration. Reports increase p.o. hydration. Continue to monitor Maintain p.o. hydration  Hyperlipidemia-11/12/2020: Cholesterol, Total 185; HDL 32; LDL Chol Calc (NIH) 109; Triglycerides 254 Continue atorvastatin Heart healthy low-sodium high-fiber diet   Disposition: Follow-up with Dr. Bjorn Pippin after cardiac catheterization  Thomasene Ripple. Satcha Storlie NP-C    12/12/2020, 3:56 PM [facility] [address] Office [phone] Fax [phone]  Notice: This dictation was prepared with Dragon dictation along with smaller phrase technology. Any transcriptional errors that result from this process are unintentional and may not be corrected upon review.  I spent 15 minutes examining this patient, reviewing medications, and using patient centered shared decision making involving her cardiac care.  Prior to her visit I spent greater than 20 minutes reviewing her past medical history,  medications, and prior cardiac tests.

## 2020-12-11 NOTE — Telephone Encounter (Signed)
Ok to send diflucan for pt?

## 2020-12-12 ENCOUNTER — Encounter: Payer: Self-pay | Admitting: General Practice

## 2020-12-12 ENCOUNTER — Ambulatory Visit (INDEPENDENT_AMBULATORY_CARE_PROVIDER_SITE_OTHER): Payer: Medicare Other | Admitting: General Practice

## 2020-12-12 ENCOUNTER — Other Ambulatory Visit: Payer: Self-pay

## 2020-12-12 VITALS — BP 132/60 | HR 86 | Ht 76.0 in | Wt 262.4 lb

## 2020-12-12 DIAGNOSIS — E785 Hyperlipidemia, unspecified: Secondary | ICD-10-CM | POA: Diagnosis not present

## 2020-12-12 DIAGNOSIS — R55 Syncope and collapse: Secondary | ICD-10-CM

## 2020-12-12 DIAGNOSIS — R072 Precordial pain: Secondary | ICD-10-CM | POA: Diagnosis not present

## 2020-12-12 DIAGNOSIS — Z79899 Other long term (current) drug therapy: Secondary | ICD-10-CM

## 2020-12-12 DIAGNOSIS — I10 Essential (primary) hypertension: Secondary | ICD-10-CM | POA: Diagnosis not present

## 2020-12-12 MED ORDER — SODIUM CHLORIDE 0.9% FLUSH: 3.0000 mL | Freq: Two times a day (BID) | INTRAVENOUS | Status: DC

## 2020-12-12 NOTE — Patient Instructions (Addendum)
  Keith Schwartz  12/12/2020  You are scheduled for a Cardiac Catheterization on Thursday, March 7 with Dr. Tonny Bollman.  1. Please arrive at the [facility] (Main Entrance A) at [facility]: [address] [place], [place] [address] at 8:30 AM (This time is two hours before your procedure to ensure your preparation). Free valet parking service is available.   Special note: Every effort is made to have your procedure done on time. Please understand that emergencies sometimes delay scheduled procedures.  2. Diet: Do not eat solid foods after midnight.  The patient may have clear liquids until 5am upon the day of the procedure.  3. Labs: You will need to have blood drawn on Thursday, March 3 at [facility] [address], [place]  Open: 8am - 5pm (Lunch 12:30 - 1:30)   Phone: [phone]. You do not need to be fasting.  COVID TEST 12-18-2020 @  testing site address:  [address] [place], [place] [address] Drive thru hours M-F 0-2:[id number]  Sat 9-12:30PM.  4. Medication instructions in preparation for your procedure:  Do not take Diabetes Med Glucovance (Metformin + Glyburide) on the day of the procedure and HOLD 48 HOURS AFTER THE PROCEDURE.  On the morning of your procedure, take your Aspirin and any morning medicines NOT listed above.  You may use sips of water.  5. Plan for one night stay--bring personal belongings. 6. Bring a current list of your medications and current insurance cards. 7. You MUST have a responsible person to drive you home. 8. Someone MUST be with you the first 24 hours after you arrive home or your discharge will be delayed. 9. Please wear clothes that are easy to get on and off and wear slip-on shoes.  YOUR FOLLOW UP APPOINTMENT HERE AT OUR [facility] OFFICE WITH DR [facility] IS 01-09-2021 @2PM   Thank you for allowing to care for you!   -- [facility]

## 2020-12-15 DIAGNOSIS — R55 Syncope and collapse: Secondary | ICD-10-CM | POA: Diagnosis not present

## 2020-12-16 ENCOUNTER — Ambulatory Visit: Payer: Medicare Other | Admitting: Cardiology

## 2020-12-18 ENCOUNTER — Telehealth: Payer: Self-pay | Admitting: *Deleted

## 2020-12-18 ENCOUNTER — Other Ambulatory Visit (HOSPITAL_COMMUNITY)
Admission: RE | Admit: 2020-12-18 | Discharge: 2020-12-18 | Disposition: A | Discharge status: Home / Self Care | Payer: Medicare Other | Source: Ambulatory Visit | Attending: Cardiovascular Disease | Admitting: Cardiovascular Disease

## 2020-12-18 DIAGNOSIS — Z20822 Contact with and (suspected) exposure to covid-19: Secondary | ICD-10-CM | POA: Insufficient documentation

## 2020-12-18 DIAGNOSIS — Z01812 Encounter for preprocedural laboratory examination: Secondary | ICD-10-CM | POA: Diagnosis not present

## 2020-12-18 DIAGNOSIS — Z79899 Other long term (current) drug therapy: Secondary | ICD-10-CM | POA: Diagnosis not present

## 2020-12-18 DIAGNOSIS — R55 Syncope and collapse: Secondary | ICD-10-CM | POA: Diagnosis not present

## 2020-12-18 DIAGNOSIS — E785 Hyperlipidemia, unspecified: Secondary | ICD-10-CM | POA: Diagnosis not present

## 2020-12-18 DIAGNOSIS — R072 Precordial pain: Secondary | ICD-10-CM | POA: Diagnosis not present

## 2020-12-18 DIAGNOSIS — I10 Essential (primary) hypertension: Secondary | ICD-10-CM | POA: Diagnosis not present

## 2020-12-18 LAB — SARS CORONAVIRUS 2 (TAT 6-24 HRS): SARS Coronavirus 2: NEGATIVE

## 2020-12-18 NOTE — Telephone Encounter (Signed)
Pt contacted pre-catheterization scheduled at [facility] for: Monday December 22, 2020 10:30 AM Verified arrival time and place: [facility] Main Entrance A [facility]) at: 8:30 AM   No solid food after midnight prior to cath, clear liquids until 5 AM day of procedure.  Hold: Metformin-day of procedure and 48 hours post procedure Glimepiride-AM of procedure  Except hold medications AM meds can be  taken pre-cath with sips of water including: ASA 81 mg   Confirmed patient has responsible adult to drive home post procedure and be with patient first 24 hours after arriving home: yes  You are allowed ONE visitor in the waiting room during the time you are at the hospital for your procedure. Both you and your visitor must wear a mask once you enter the hospital.  Reviewed procedure/mask/visitor instructions with patient.

## 2020-12-19 LAB — CBC
Hematocrit: 48.1 % (ref 37.5–51.0)
Hemoglobin: 16.3 g/dL (ref 13.0–17.7)
MCH: 29.2 pg (ref 26.6–33.0)
MCHC: 33.9 g/dL (ref 31.5–35.7)
MCV: 86 fL (ref 79–97)
Platelets: 229 10*3/uL (ref 150–450)
RBC: 5.59 x10E6/uL (ref 4.14–5.80)
RDW: 13 % (ref 11.6–15.4)
WBC: 7.6 10*3/uL (ref 3.4–10.8)

## 2020-12-19 LAB — BASIC METABOLIC PANEL
BUN/Creatinine Ratio: 20 (ref 10–24)
BUN: 26 mg/dL (ref 8–27)
CO2: 20 mmol/L (ref 20–29)
Calcium: 10.2 mg/dL (ref 8.6–10.2)
Chloride: 100 mmol/L (ref 96–106)
Creatinine, Ser: 1.27 mg/dL (ref 0.76–1.27)
Glucose: 178 mg/dL — ABNORMAL HIGH (ref 65–99)
Potassium: 4.6 mmol/L (ref 3.5–5.2)
Sodium: 138 mmol/L (ref 134–144)
eGFR: 61 mL/min/{1.73_m2} (ref >59)

## 2020-12-22 ENCOUNTER — Ambulatory Visit (HOSPITAL_COMMUNITY)
Admission: RE | Admit: 2020-12-22 | Discharge: 2020-12-22 | Disposition: A | Discharge status: Home / Self Care | Payer: Medicare Other | Attending: Cardiovascular Disease | Admitting: Cardiovascular Disease

## 2020-12-22 ENCOUNTER — Other Ambulatory Visit: Payer: Self-pay

## 2020-12-22 ENCOUNTER — Encounter (HOSPITAL_COMMUNITY): Payer: Self-pay | Admitting: Cardiovascular Disease

## 2020-12-22 ENCOUNTER — Encounter (HOSPITAL_COMMUNITY)
Admission: RE | Disposition: A | Discharge status: Home / Self Care | Payer: Self-pay | Source: Home / Self Care | Attending: Cardiovascular Disease

## 2020-12-22 DIAGNOSIS — I429 Cardiomyopathy, unspecified: Secondary | ICD-10-CM | POA: Diagnosis not present

## 2020-12-22 DIAGNOSIS — Z7982 Long term (current) use of aspirin: Secondary | ICD-10-CM | POA: Diagnosis not present

## 2020-12-22 DIAGNOSIS — R079 Chest pain, unspecified: Secondary | ICD-10-CM | POA: Diagnosis present

## 2020-12-22 DIAGNOSIS — Z79899 Other long term (current) drug therapy: Secondary | ICD-10-CM | POA: Diagnosis not present

## 2020-12-22 DIAGNOSIS — Z880 Allergy status to penicillin: Secondary | ICD-10-CM | POA: Insufficient documentation

## 2020-12-22 DIAGNOSIS — R072 Precordial pain: Secondary | ICD-10-CM

## 2020-12-22 DIAGNOSIS — I25119 Atherosclerotic heart disease of native coronary artery with unspecified angina pectoris: Secondary | ICD-10-CM | POA: Diagnosis present

## 2020-12-22 DIAGNOSIS — E785 Hyperlipidemia, unspecified: Secondary | ICD-10-CM | POA: Insufficient documentation

## 2020-12-22 DIAGNOSIS — Z7984 Long term (current) use of oral hypoglycemic drugs: Secondary | ICD-10-CM | POA: Insufficient documentation

## 2020-12-22 DIAGNOSIS — E119 Type 2 diabetes mellitus without complications: Secondary | ICD-10-CM | POA: Diagnosis not present

## 2020-12-22 DIAGNOSIS — I10 Essential (primary) hypertension: Secondary | ICD-10-CM | POA: Insufficient documentation

## 2020-12-22 DIAGNOSIS — Z8249 Family history of ischemic heart disease and other diseases of the circulatory system: Secondary | ICD-10-CM | POA: Insufficient documentation

## 2020-12-22 HISTORY — PX: INTRAVASCULAR PRESSURE WIRE/FFR STUDY: CATH118243

## 2020-12-22 HISTORY — PX: RIGHT/LEFT HEART CATH AND CORONARY ANGIOGRAPHY: CATH118266

## 2020-12-22 LAB — POCT I-STAT EG7
Acid-Base Excess: 1 mmol/L (ref 0.0–2.0)
Bicarbonate: 26.6 mmol/L (ref 20.0–28.0)
Calcium, Ion: 1.19 mmol/L (ref 1.15–1.40)
HCT: 43 % (ref 39.0–52.0)
Hemoglobin: 14.6 g/dL (ref 13.0–17.0)
O2 Saturation: 72 %
Potassium: 3.8 mmol/L (ref 3.5–5.1)
Sodium: 140 mmol/L (ref 135–145)
TCO2: 28 mmol/L (ref 22–32)
pCO2, Ven: 44.3 mmHg (ref 44.0–60.0)
pH, Ven: 7.385 (ref 7.250–7.430)
pO2, Ven: 39 mmHg (ref 32.0–45.0)

## 2020-12-22 LAB — POCT I-STAT 7, (LYTES, BLD GAS, ICA,H+H)
Acid-Base Excess: 1 mmol/L (ref 0.0–2.0)
Bicarbonate: 27 mmol/L (ref 20.0–28.0)
Calcium, Ion: 1.26 mmol/L (ref 1.15–1.40)
HCT: 44 % (ref 39.0–52.0)
Hemoglobin: 15 g/dL (ref 13.0–17.0)
O2 Saturation: 98 %
Potassium: 3.9 mmol/L (ref 3.5–5.1)
Sodium: 138 mmol/L (ref 135–145)
TCO2: 28 mmol/L (ref 22–32)
pCO2 arterial: 45.6 mmHg (ref 32.0–48.0)
pH, Arterial: 7.38 (ref 7.350–7.450)
pO2, Arterial: 116 mmHg — ABNORMAL HIGH (ref 83.0–108.0)

## 2020-12-22 LAB — GLUCOSE, CAPILLARY: Glucose-Capillary: 229 mg/dL — ABNORMAL HIGH (ref 70–99)

## 2020-12-22 LAB — POCT ACTIVATED CLOTTING TIME: Activated Clotting Time: 255 seconds

## 2020-12-22 SURGERY — RIGHT/LEFT HEART CATH AND CORONARY ANGIOGRAPHY
Anesthesia: LOCAL

## 2020-12-22 MED ORDER — HEPARIN SODIUM (PORCINE) 1000 UNIT/ML IJ SOLN
INTRAMUSCULAR | Status: DC | PRN
  Administered 2020-12-22: 5000 [IU] via INTRAVENOUS

## 2020-12-22 MED ORDER — ACETAMINOPHEN 325 MG PO TABS: 650.0000 mg | ORAL_TABLET | ORAL | Status: DC | PRN

## 2020-12-22 MED ORDER — FENTANYL CITRATE (PF) 100 MCG/2ML IJ SOLN
INTRAMUSCULAR | Status: AC
  Filled 2020-12-22: qty 2

## 2020-12-22 MED ORDER — ONDANSETRON HCL 4 MG/2ML IJ SOLN: 4.0000 mg | Freq: Four times a day (QID) | INTRAMUSCULAR | Status: DC | PRN

## 2020-12-22 MED ORDER — FENTANYL CITRATE (PF) 100 MCG/2ML IJ SOLN
INTRAMUSCULAR | Status: DC | PRN
  Administered 2020-12-22: 25 ug via INTRAVENOUS

## 2020-12-22 MED ORDER — SODIUM CHLORIDE 0.9 % WEIGHT BASED INFUSION: 1.0000 mL/kg/h | INTRAVENOUS | Status: DC

## 2020-12-22 MED ORDER — IOHEXOL 350 MG/ML SOLN
INTRAVENOUS | Status: DC | PRN
  Administered 2020-12-22: 115 mL

## 2020-12-22 MED ORDER — SODIUM CHLORIDE 0.9% FLUSH: 3.0000 mL | INTRAVENOUS | Status: DC | PRN

## 2020-12-22 MED ORDER — HEPARIN (PORCINE) IN NACL 1000-0.9 UT/500ML-% IV SOLN
INTRAVENOUS | Status: AC
  Filled 2020-12-22: qty 1000

## 2020-12-22 MED ORDER — SODIUM CHLORIDE 0.9% FLUSH: 3.0000 mL | Freq: Two times a day (BID) | INTRAVENOUS | Status: DC

## 2020-12-22 MED ORDER — SODIUM CHLORIDE 0.9 % IV SOLN: 250.0000 mL | INTRAVENOUS | Status: DC | PRN

## 2020-12-22 MED ORDER — ASPIRIN 81 MG PO CHEW: 81.0000 mg | CHEWABLE_TABLET | ORAL | Status: DC

## 2020-12-22 MED ORDER — HEPARIN (PORCINE) IN NACL 1000-0.9 UT/500ML-% IV SOLN
INTRAVENOUS | Status: DC | PRN
  Administered 2020-12-22 (×2): 500 mL

## 2020-12-22 MED ORDER — HEPARIN SODIUM (PORCINE) 1000 UNIT/ML IJ SOLN
INTRAMUSCULAR | Status: AC
  Filled 2020-12-22: qty 1

## 2020-12-22 MED ORDER — MIDAZOLAM HCL 2 MG/2ML IJ SOLN
INTRAMUSCULAR | Status: AC
  Filled 2020-12-22: qty 2

## 2020-12-22 MED ORDER — SODIUM CHLORIDE 0.9 % WEIGHT BASED INFUSION: 3.0000 mL/kg/h | INTRAVENOUS | Status: AC

## 2020-12-22 MED ORDER — VERAPAMIL HCL 2.5 MG/ML IV SOLN
INTRAVENOUS | Status: AC
  Filled 2020-12-22: qty 2

## 2020-12-22 MED ORDER — MIDAZOLAM HCL 2 MG/2ML IJ SOLN
INTRAMUSCULAR | Status: DC | PRN
  Administered 2020-12-22: 2 mg via INTRAVENOUS

## 2020-12-22 MED ORDER — LIDOCAINE HCL (PF) 1 % IJ SOLN
INTRAMUSCULAR | Status: AC
  Filled 2020-12-22: qty 30

## 2020-12-22 MED ORDER — HYDRALAZINE HCL 20 MG/ML IJ SOLN: 10.0000 mg | INTRAMUSCULAR | Status: DC | PRN

## 2020-12-22 MED ORDER — VERAPAMIL HCL 2.5 MG/ML IV SOLN
INTRAVENOUS | Status: DC | PRN
  Administered 2020-12-22: 10 mL via INTRA_ARTERIAL

## 2020-12-22 MED ORDER — LIDOCAINE HCL (PF) 1 % IJ SOLN
INTRAMUSCULAR | Status: DC | PRN
  Administered 2020-12-22: 4 mL

## 2020-12-22 MED ORDER — LABETALOL HCL 5 MG/ML IV SOLN: 10.0000 mg | INTRAVENOUS | Status: DC | PRN

## 2020-12-22 SURGICAL SUPPLY — 14 items
CATH 5FR JL3.5 JR4 ANG PIG MP (CATHETERS) ×1 IMPLANT
CATH BALLN WEDGE 5F 110CM (CATHETERS) ×1 IMPLANT
CATH VISTA GUIDE 6FR XBLAD4 (CATHETERS) ×1 IMPLANT
DEVICE RAD COMP TR BAND LRG (VASCULAR PRODUCTS) ×1 IMPLANT
GLIDESHEATH SLEND SS 6F .021 (SHEATH) ×1 IMPLANT
GUIDEWIRE INQWIRE 1.5J.035X260 (WIRE) IMPLANT
GUIDEWIRE PRESSURE X 175 (WIRE) ×1 IMPLANT
INQWIRE 1.5J .035X260CM (WIRE) ×2
KIT ESSENTIALS PG (KITS) ×1 IMPLANT
KIT HEART LEFT (KITS) ×2 IMPLANT
PACK CARDIAC CATHETERIZATION (CUSTOM PROCEDURE TRAY) ×2 IMPLANT
SHEATH GLIDE SLENDER 4/5FR (SHEATH) ×1 IMPLANT
TRANSDUCER W/STOPCOCK (MISCELLANEOUS) ×2 IMPLANT
TUBING CIL FLEX 10 FLL-RA (TUBING) ×2 IMPLANT

## 2020-12-22 NOTE — Discharge Instructions (Signed)
 HOLD METFORMIN FOR A FULL 48 HOURS AFTER DISCHARGE.  Radial Site Care  This sheet gives you information about how to care for yourself after your procedure. Your health care provider may also give you more specific instructions. If you have problems or questions, contact your health care provider. What can I expect after the procedure? After the procedure, it is common to have:  Bruising and tenderness at the catheter insertion area. Follow these instructions at home: Medicines  Take over-the-counter and prescription medicines only as told by your health care provider. Insertion site care 1. Follow instructions from your health care provider about how to take care of your insertion site. Make sure you: ? Wash your hands with soap and water before you remove your bandage (dressing). If soap and water are not available, use hand sanitizer. ? May remove dressing in 24 hours. 2. Check your insertion site every day for signs of infection. Check for: ? Redness, swelling, or pain. ? Fluid or blood. ? Pus or a bad smell. ? Warmth. 3. Do no take baths, swim, or use a hot tub for 5 days. 4. You may shower 24-48 hours after the procedure. ? Remove the dressing and gently wash the site with plain soap and water. ? Pat the area dry with a clean towel. ? Do not rub the site. That could cause bleeding. 5. Do not apply powder or lotion to the site. Activity  1. For 24 hours after the procedure, or as directed by your health care provider: ? Do not flex or bend the affected arm. ? Do not push or pull heavy objects with the affected arm. ? Do not drive yourself home from the hospital or clinic. You may drive 24 hours after the procedure. ? Do not operate machinery or power tools. ? KEEP ARM ELEVATED THE REMAINDER OF THE DAY. 2. Do not push, pull or lift anything that is heavier than 10 lb for 5 days. 3. Ask your health care provider when it is okay to: ? Return to work or school. ? Resume  usual physical activities or sports. ? Resume sexual activity. General instructions  If the catheter site starts to bleed, raise your arm and put firm pressure on the site. If the bleeding does not stop, get help right away. This is a medical emergency.  DRINK PLENTY OF FLUIDS FOR THE NEXT 2-3 DAYS.  No alcohol consumption for 24 hours after receiving sedation.  If you went home on the same day as your procedure, a responsible adult should be with you for the first 24 hours after you arrive home.  Keep all follow-up visits as told by your health care provider. This is important. Contact a health care provider if:  You have a fever.  You have redness, swelling, or yellow drainage around your insertion site. Get help right away if:  You have unusual pain at the radial site.  The catheter insertion area swells very fast.  The insertion area is bleeding, and the bleeding does not stop when you hold steady pressure on the area.  Your arm or hand becomes pale, cool, tingly, or numb. These symptoms may represent a serious problem that is an emergency. Do not wait to see if the symptoms will go away. Get medical help right away. Call your local emergency services (911 in the [place] Do not drive yourself to the hospital. Summary  After the procedure, it is common to have bruising and tenderness at the site.  Follow   instructions from your health care provider about how to take care of your radial site wound. Check the wound every day for signs of infection.  This information is not intended to replace advice given to you by your health care provider. Make sure you discuss any questions you have with your health care provider. Document Revised: 11/09/2017 Document Reviewed: 11/09/2017 Elsevier Patient Education  2020 Elsevier Inc[employer] 

## 2020-12-22 NOTE — Progress Notes (Signed)
Attempted to call pt wife message left for her to call me back.

## 2020-12-22 NOTE — Interval H&P Note (Signed)
Cath Lab Visit (complete for each Cath Lab visit)  Clinical Evaluation Leading to the Procedure:   ACS: No.  Non-ACS:    Anginal Classification: CCS III  Anti-ischemic medical therapy: No Therapy  Non-Invasive Test Results: High-risk stress test findings: cardiac mortality >3%/year  Prior CABG: No previous CABG      History and Physical Interval Note:  12/22/2020 9:34 AM  Keith Schwartz  has presented today for surgery, with the diagnosis of CAD.  The various methods of treatment have been discussed with the patient and family. After consideration of risks, benefits and other options for treatment, the patient has consented to  Procedure(s): RIGHT/LEFT HEART CATH AND CORONARY ANGIOGRAPHY (N/A) as a surgical intervention.  The patient's history has been reviewed, patient examined, no change in status, stable for surgery.  I have reviewed the patient's chart and labs.  Questions were answered to the patient's satisfaction.     Tonny Bollman

## 2020-12-22 NOTE — Progress Notes (Signed)
Discharge instructions reviewed with pt and his wife (via telephone) both voice understanding.  

## 2020-12-22 NOTE — Research (Signed)
[facility] Informed Consent   Subject Name: Keith Schwartz  Subject met inclusion and exclusion criteria.  The informed consent form, study requirements and expectations were reviewed with the subject and questions and concerns were addressed prior to the signing of the consent form.  The subject verbalized understanding of the trail requirements.  The subject agreed to participate in the [facility] trial and signed the informed consent.  The informed consent was obtained prior to performance of any protocol-specific procedures for the subject.  A copy of the signed informed consent was given to the subject and a copy was placed in the subject's medical record.  Philemon Kingdom D 12/22/2020, 0837 am

## 2020-12-29 NOTE — Progress Notes (Signed)
Cardiology Office Note:    Date:  12/30/2020   ID:  Keith Schwartz, DOB July 23, 1951, MRN [id number]  PCP:  Donita Brooks, MD  Cardiologist:  Little Ishikawa, MD  Electrophysiologist:  None   Referring MD: Donita Brooks, MD   Chief Complaint  Patient presents with  . Coronary Artery Disease    History of Present Illness:    Keith Schwartz is a 70 y.o. male with a hx of CAD, chronic combined systolic and also heart failure, T2DM, hypertension who presents for follow-up.  He was referred by Dr. Tanya Nones for evaluation of syncope, initially seen on 11/12/2020.  He had an episode of syncope on 10/16/2020.  Reports he had bronchitis at the time and had taken DayQuil that morning, in addition to taking lisinopril.  He was at [facility] and had checked his blood pressure and noted it was low, 90s over 60s.  Subsequently was standing in the checkout line and suddenly passed out.  Denies any prodromal symptoms.  States that he fell to the ground, did not hit his head.  Thinks he was unconscious only for a few seconds.  EMS was called and reportedly BP was down to 70s.  He received IV fluids and BP up to 90s on arrival to ED.  In ED, EKG showed left bundle branch block.  Labs notable for creatinine 1.75.  He has not taken lisinopril since discharge from ED.  He denies any lightheadedness or syncope since this episode, or prior to that.  Denies any palpitations.  Does report he has been having chest pain.  Describes as pressure in center of upper chest that occurs with exertion.  States that he will continue to exert himself and it will subside, typically last for 1 to 2 minutes.  No smoking history.  Family history includes father had MI and died of CHF in 77s.  Echocardiogram on 12/09/2020 showed EF 30%, normal RV function.  Lexiscan Myoview on 12/09/2020 showed large severe fixed defect involving inferior and anterior walls with preservation of lateral wall and LVEF 24%, suggesting extensive  infarct.  Cardiac catheterization on 12/22/2020 showed diffusely diseased LAD with positive RFR, nonobstructive LCx stenosis, mild diffuse RCA disease, normal right heart pressures.  Zio patch x14 days on 12/15/2020 showed 1 episode of NSVT lasting 4 beats, 3 episodes of SVT longest lasting 10 beats.   Since his heart catheterization, he reports that he has been doing okay.  Does report chest pain that he describes as pressure if he does heavy exertion.  Reports that he was carrying 50 pound bags of chicken feed the other day and had chest pain with this.  Has not had to take any nitroglycerin.  Does report shortness of breath with exertion.  Denies any lightheadedness, syncope, palpitations, lower extremity edema.  Reports he stopped taking his atorvastatin 2 to 3 months ago.  Also reports that he has pain in both legs and had previously been told at the [place] that he had PAD.   Past Medical History:  Diagnosis Date  . Diabetes mellitus without complication (HCC) 2002  . Hypertension 2002    Past Surgical History:  Procedure Laterality Date  . APPENDECTOMY  1970  . INTRAVASCULAR PRESSURE WIRE/FFR STUDY N/A 12/22/2020   Procedure: INTRAVASCULAR PRESSURE WIRE/FFR STUDY;  Surgeon: Tonny Bollman, MD;  Location: [facility] INVASIVE CV LAB;  Service: Cardiovascular;  Laterality: N/A;  . JOINT REPLACEMENT  2012   rt knee  . RIGHT/LEFT HEART CATH AND CORONARY  ANGIOGRAPHY N/A 12/22/2020   Procedure: RIGHT/LEFT HEART CATH AND CORONARY ANGIOGRAPHY;  Surgeon: Tonny Bollmanooper, Michael, MD;  [facility];  Service: Cardiovascular;  Laterality: N/A;  . VASECTOMY  1986    Current Medications: Current Meds  Medication Sig  . Ascorbic Acid (VITAMIN C) 1000 MG tablet Take 1,000 mg by mouth daily.  Marland Kitchen. aspirin 81 MG EC tablet Take 81 mg by mouth daily.  Marland Kitchen. atorvastatin (LIPITOR) 80 MG tablet Take 1 tablet (80 mg total) by mouth daily.  . clotrimazole-betamethasone (LOTRISONE) cream Apply 1 application topically 2  (two) times daily.  . diclofenac Sodium (VOLTAREN) 1 % GEL Apply 1 application topically daily as needed for pain.  Marland Kitchen. glimepiride (AMARYL) 4 MG tablet Take 4 mg by mouth 2 (two) times daily.  . Glucose Blood (BLOOD GLUCOSE TEST STRIPS) STRP Please dispense based on patient and insurance preference. Use as directed to monitor FSBS 3x daily. Dx: Z6109: E1165.  . hydrOXYzine (ATARAX/VISTARIL) 25 MG tablet Take 1 tablet (25 mg total) by mouth 3 (three) times daily as needed. (Patient taking differently: Take 50 mg by mouth at bedtime.)  . losartan (COZAAR) 25 MG tablet Take 0.5 tablets (12.5 mg total) by mouth daily.  . magnesium oxide (MAG-OX) 400 MG tablet Take 400 mg by mouth daily.  . melatonin 5 MG TABS Take 10 mg by mouth at bedtime.  . metFORMIN (GLUCOPHAGE) 1000 MG tablet TAKE 1 TABLET TWICE A DAY WITH MEALS (Patient taking differently: Take 1,000 mg by mouth 2 (two) times daily with a meal.)  . metoprolol succinate (TOPROL XL) 25 MG 24 hr tablet Take 1 tablet (25 mg total) by mouth daily.  . Multiple Vitamin (MULTIVITAMIN) tablet Take 1 tablet by mouth daily. MEN'S ONE A DAY 50+  . nitroGLYCERIN (NITROSTAT) 0.4 MG SL tablet Place 1 tablet (0.4 mg total) under the tongue every 5 (five) minutes as needed for chest pain.  . pramipexole (MIRAPEX) 1 MG tablet Take 1 mg by mouth at bedtime.  Marland Kitchen. trimethoprim-polymyxin b (POLYTRIM) ophthalmic solution Place 1 drop into both eyes every 4 (four) hours.  . TRULICITY 1.5 MG/0.5ML SOPN INJECT 1 PEN UNDER THE SKIN ONCE A WEEK (Patient taking differently: Inject 1.5 mg as directed once a week.)  . vardenafil (LEVITRA) 20 MG tablet Take 20 mg by mouth as needed for erectile dysfunction.  . Vitamin D3 (VITAMIN D) 25 MCG tablet Take 1,000 Units by mouth daily.  . vitamin E 400 UNIT capsule Take 400 Units by mouth daily.  Marland Kitchen. zinc gluconate 50 MG tablet Take 50 mg by mouth daily.   Current Facility-Administered Medications for the 12/30/20 encounter (Office Visit) with  Little IshikawaSchumann, Athira Janowicz L, MD  Medication  . sodium chloride flush (NS) 0.9 % injection 3 mL     Allergies:   Glipizide and Penicillins   Social History   Socioeconomic History  . Marital status: Married    Spouse name: Not on file  . Number of children: Not on file  . Years of education: Not on file  . Highest education level: Not on file  Occupational History  . Not on file  Tobacco Use  . Smoking status: Never Smoker  . Smokeless tobacco: Never Used  Substance and Sexual Activity  . Alcohol use: Yes    Alcohol/week: 1.0 standard drink    Types: 1 Cans of beer per week  . Drug use: Never  . Sexual activity: Yes    Birth control/protection: Surgical  Other Topics Concern  .  Not on file  Social History Narrative  . Not on file   Social Determinants of Health   Financial Resource Strain: Not on file  Food Insecurity: Not on file  Transportation Needs: Not on file  Physical Activity: Not on file  Stress: Not on file  Social Connections: Not on file     Family History: The patient's family history includes Arthritis in his mother; Asthma in his mother; Early death in his sister; Hearing loss in his mother; Heart disease in his father; Hyperlipidemia in his father; Hypertension in his father; Vision loss in his mother.  ROS:   Please see the history of present illness.     All other systems reviewed and are negative.  EKGs/Labs/Other Studies Reviewed:    The following studies were reviewed today:  EKG:  EKG is ordered today.  The ekg ordered today demonstrates normal sinus rhythm, rate 80, left bundle branch block  Recent Labs: 10/16/2020: ALT 26 12/18/2020: BUN 26; Creatinine, Ser 1.27; Platelets 229 12/22/2020: Hemoglobin 14.6; Hemoglobin 15.0; Potassium 3.8; Potassium 3.9; Sodium 140; Sodium 138  Recent Lipid Panel    Component Value Date/Time   CHOL 185 11/12/2020 1528   TRIG 254 (H) 11/12/2020 1528   HDL 32 (L) 11/12/2020 1528   CHOLHDL 5.8 (H) 11/12/2020  1528   LDLCALC 109 (H) 11/12/2020 1528    Physical Exam:    VS:  BP 132/76 (BP Location: Left Arm)   Pulse 80   Ht 6\' 4"  (1.93 m)   Wt 263 lb 6.4 oz (119.5 kg)   SpO2 97%   BMI 32.06 kg/m     Wt Readings from Last 3 Encounters:  12/30/20 263 lb 6.4 oz (119.5 kg)  12/22/20 255 lb (115.7 kg)  12/12/20 262 lb 6.4 oz (119 kg)     GEN: Well nourished, well developed in no acute distress HEENT: Normal NECK: No JVD; No carotid bruits CARDIAC:RRR, 2 out of 6 systolic murmur RESPIRATORY:  Clear to auscultation without rales, wheezing or rhonchi  ABDOMEN: Soft, non-tender, non-distended MUSCULOSKELETAL:  No edema; No deformity  SKIN: Warm and dry NEUROLOGIC:  Alert and oriented x 3 PSYCHIATRIC:  Normal affect   ASSESSMENT:    1. CAD in native artery   2. Chronic combined systolic and diastolic heart failure (HCC)   3. Leg pain, bilateral   4. Syncope, unspecified syncope type   5. Hyperlipidemia, unspecified hyperlipidemia type    PLAN:    CAD: Reported chest pain concerning for angina and EKG with left bundle branch block.  Echocardiogram on 12/09/2020 showed EF 30%, normal RV function.  Lexiscan Myoview on 12/09/2020 showed large severe fixed defect involving inferior and anterior walls with preservation of lateral wall and LVEF 24%, suggesting extensive infarct.  Cardiac catheterization on 12/22/2020 showed diffusely diseased LAD with positive RFR, nonobstructive LCx stenosis, mild diffuse RCA disease, normal right heart pressures.  Medical management recommended for obstructive LAD disease.  Currently reporting chest pain with significant exertion. - Continue aspirin 81 mg daily - Increase atorvastatin to 80 mg daily -Start Toprol-XL 25 mg daily  Chronic combined systolic and diastolic heart failure: EF 30% on echocardiogram 12/09/2020.  Diffuse severe LAD disease as above, medical management recommended -Start losartan 12.5 mg daily.  Check BMP in 1 week -Start Toprol-XL 25 mg  daily -Recent syncopal episode felt to be due to hypotension.  Will monitor BP on these medications and continue to titrate as tolerated.  Follow-up in pharmacy clinic in 2 weeks.  If tolerating losartan would plan to transition to [facility] -Repeat echo in 3 months, if no improvement in systolic function would plan EP referral for ICD  Syncope: Suspect due to hypotension, which may have been due to dehydration in setting of URI and continuing to take lisinopril.  SBP in 70s on EMS arrival.  No prodromal symptoms, arrhythmia remains on differential.  Echocardiogram on 12/09/2020 showed EF 30%, normal RV function.   Zio patch x14 days on 12/15/2020 showed 1 episode of NSVT lasting 4 beats, 3 episodes of SVT longest lasting 10 beats.  Hypertension: Previously was on lisinopril 10 mg daily but held after recent episode of hypotension.  Start losartan and Toprol-XL as above  Hyperlipidemia: On atorvastatin 40 mg daily.  LDL 109 on 11/12/2020.  Increase atorvastatin to 80 mg  T2DM: On metformin, glimepiride, Trulicity  AKI: Creatinine 1.75 on 10/16/2020, improved to creatinine 1.27 on 3/3  Leg pain: Will check ABIs   RTC in 3 months     Medication Adjustments/Labs and Tests Ordered: Current medicines are reviewed at length with the patient today.  Concerns regarding medicines are outlined above.  Orders Placed This Encounter  Procedures  . Comprehensive metabolic panel  . EKG 12-Lead  . VAS [place] ABI WITH/WO TBI  . VAS [place] LOWER EXTREMITY ARTERIAL DUPLEX   Meds ordered this encounter  Medications  . atorvastatin (LIPITOR) 80 MG tablet    Sig: Take 1 tablet (80 mg total) by mouth daily.    Dispense:  90 tablet    Refill:  3  . metoprolol succinate (TOPROL XL) 25 MG 24 hr tablet    Sig: Take 1 tablet (25 mg total) by mouth daily.    Dispense:  90 tablet    Refill:  3  . losartan (COZAAR) 25 MG tablet    Sig: Take 0.5 tablets (12.5 mg total) by mouth daily.    Dispense:  45 tablet     Refill:  3    Patient Instructions  Medication Instructions:  RESTART atorvastatin (Lipitor) 80 mg daily START metoprolol succinate (Toprol XL) 25 mg daily START Losartan 12.5 mg daily  *If you need a refill on your cardiac medications before your next appointment, please call your pharmacy*   Lab Work: Please return for labs in 1 week (CMET)  Our in office lab hours are Monday-Friday 8:00-4:00, closed for lunch 12:45-1:45 pm.  No appointment needed.  Testing/Procedures: Your physician has requested that you have an ankle brachial index (ABI). During this test an ultrasound and blood pressure cuff are used to evaluate the arteries that supply the arms and legs with blood. Allow thirty minutes for this exam. There are no restrictions or special instructions.  Follow-Up: At [facility], you and your health needs are our priority.  As part of our continuing mission to provide you with exceptional heart care, we have created designated Provider Care Teams.  These Care Teams include your primary Cardiologist (physician) and Advanced Practice Providers (APPs -  Physician Assistants and Nurse Practitioners) who all work together to provide you with the care you need, when you need it.  We recommend signing up for the patient portal called "MyChart".  Sign up information is provided on this After Visit Summary.  MyChart is used to connect with patients for Virtual Visits (Telemedicine).  Patients are able to view lab/test results, encounter notes, upcoming appointments, etc.  Non-urgent messages can be sent to your provider as well.   To learn more about what you  can do with MyChart, go to [email or web address].    Your next appointment:   2 week(s) with pharmD (HF med titration)  May as scheduled with Dr. Bjorn Pippin       Signed, Little Ishikawa, MD  12/30/2020 1:13 PM    [facility]

## 2020-12-30 ENCOUNTER — Encounter: Payer: Self-pay | Admitting: Cardiology

## 2020-12-30 ENCOUNTER — Ambulatory Visit (INDEPENDENT_AMBULATORY_CARE_PROVIDER_SITE_OTHER): Payer: Medicare Other | Admitting: Cardiology

## 2020-12-30 ENCOUNTER — Other Ambulatory Visit: Payer: Self-pay

## 2020-12-30 VITALS — BP 132/76 | HR 80 | Ht 76.0 in | Wt 263.4 lb

## 2020-12-30 DIAGNOSIS — M79605 Pain in left leg: Secondary | ICD-10-CM

## 2020-12-30 DIAGNOSIS — I2510 Atherosclerotic heart disease of native coronary artery without angina pectoris: Secondary | ICD-10-CM | POA: Diagnosis not present

## 2020-12-30 DIAGNOSIS — M79604 Pain in right leg: Secondary | ICD-10-CM

## 2020-12-30 DIAGNOSIS — R55 Syncope and collapse: Secondary | ICD-10-CM | POA: Diagnosis not present

## 2020-12-30 DIAGNOSIS — E785 Hyperlipidemia, unspecified: Secondary | ICD-10-CM

## 2020-12-30 DIAGNOSIS — I5042 Chronic combined systolic (congestive) and diastolic (congestive) heart failure: Secondary | ICD-10-CM

## 2020-12-30 MED ORDER — ATORVASTATIN CALCIUM 80 MG PO TABS: 80.0000 mg | ORAL_TABLET | Freq: Every day | ORAL | 3 refills | Status: DC

## 2020-12-30 MED ORDER — LOSARTAN POTASSIUM 25 MG PO TABS: 12.5000 mg | ORAL_TABLET | Freq: Every day | ORAL | 3 refills | Status: DC

## 2020-12-30 MED ORDER — METOPROLOL SUCCINATE ER 25 MG PO TB24: 25.0000 mg | ORAL_TABLET | Freq: Every day | ORAL | 3 refills | Status: DC

## 2020-12-30 NOTE — Patient Instructions (Addendum)
Medication Instructions:  RESTART atorvastatin (Lipitor) 80 mg daily START metoprolol succinate (Toprol XL) 25 mg daily START Losartan 12.5 mg daily  *If you need a refill on your cardiac medications before your next appointment, please call your pharmacy*   Lab Work: Please return for labs in 1 week (CMET)  Our in office lab hours are Monday-Friday 8:00-4:00, closed for lunch 12:45-1:45 pm.  No appointment needed.  Testing/Procedures: Your physician has requested that you have an ankle brachial index (ABI). During this test an ultrasound and blood pressure cuff are used to evaluate the arteries that supply the arms and legs with blood. Allow thirty minutes for this exam. There are no restrictions or special instructions.  Follow-Up: At [facility], you and your health needs are our priority.  As part of our continuing mission to provide you with exceptional heart care, we have created designated Provider Care Teams.  These Care Teams include your primary Cardiologist (physician) and Advanced Practice Providers (APPs -  Physician Assistants and Nurse Practitioners) who all work together to provide you with the care you need, when you need it.  We recommend signing up for the patient portal called "MyChart".  Sign up information is provided on this After Visit Summary.  MyChart is used to connect with patients for Virtual Visits (Telemedicine).  Patients are able to view lab/test results, encounter notes, upcoming appointments, etc.  Non-urgent messages can be sent to your provider as well.   To learn more about what you can do with MyChart, go to [email or web address].    Your next appointment:   2 week(s) with pharmD (HF med titration)  May as scheduled with Dr. Bjorn Pippin

## 2021-01-08 ENCOUNTER — Ambulatory Visit (HOSPITAL_COMMUNITY)
Admission: RE | Admit: 2021-01-08 | Discharge: 2021-01-08 | Disposition: A | Discharge status: Home / Self Care | Payer: Medicare Other | Source: Ambulatory Visit | Attending: Cardiovascular Disease | Admitting: Cardiovascular Disease

## 2021-01-08 ENCOUNTER — Other Ambulatory Visit: Payer: Self-pay

## 2021-01-08 DIAGNOSIS — M79604 Pain in right leg: Secondary | ICD-10-CM | POA: Diagnosis not present

## 2021-01-08 DIAGNOSIS — I5042 Chronic combined systolic (congestive) and diastolic (congestive) heart failure: Secondary | ICD-10-CM | POA: Diagnosis not present

## 2021-01-08 DIAGNOSIS — M79605 Pain in left leg: Secondary | ICD-10-CM | POA: Insufficient documentation

## 2021-01-08 DIAGNOSIS — I2510 Atherosclerotic heart disease of native coronary artery without angina pectoris: Secondary | ICD-10-CM | POA: Diagnosis not present

## 2021-01-08 LAB — COMPREHENSIVE METABOLIC PANEL
ALT: 28 IU/L (ref 0–44)
AST: 26 IU/L (ref 0–40)
Albumin/Globulin Ratio: 1.8 (ref 1.2–2.2)
Albumin: 4.4 g/dL (ref 3.8–4.8)
Alkaline Phosphatase: 86 IU/L (ref 44–121)
BUN/Creatinine Ratio: 15 (ref 10–24)
BUN: 18 mg/dL (ref 8–27)
Bilirubin Total: 0.7 mg/dL (ref 0.0–1.2)
CO2: 20 mmol/L (ref 20–29)
Calcium: 9.5 mg/dL (ref 8.6–10.2)
Chloride: 102 mmol/L (ref 96–106)
Creatinine, Ser: 1.23 mg/dL (ref 0.76–1.27)
Globulin, Total: 2.4 g/dL (ref 1.5–4.5)
Glucose: 187 mg/dL — ABNORMAL HIGH (ref 65–99)
Potassium: 4.3 mmol/L (ref 3.5–5.2)
Sodium: 140 mmol/L (ref 134–144)
Total Protein: 6.8 g/dL (ref 6.0–8.5)
eGFR: 64 mL/min/{1.73_m2} (ref >59)

## 2021-01-09 ENCOUNTER — Ambulatory Visit: Payer: Medicare Other | Admitting: Cardiology

## 2021-01-15 ENCOUNTER — Other Ambulatory Visit: Payer: Self-pay

## 2021-01-15 ENCOUNTER — Ambulatory Visit (INDEPENDENT_AMBULATORY_CARE_PROVIDER_SITE_OTHER): Payer: Medicare Other | Admitting: Pharmacist

## 2021-01-15 VITALS — BP 130/80 | HR 75 | Wt 259.6 lb

## 2021-01-15 DIAGNOSIS — I2510 Atherosclerotic heart disease of native coronary artery without angina pectoris: Secondary | ICD-10-CM | POA: Diagnosis not present

## 2021-01-15 DIAGNOSIS — I5042 Chronic combined systolic (congestive) and diastolic (congestive) heart failure: Secondary | ICD-10-CM | POA: Diagnosis not present

## 2021-01-15 MED ORDER — ENTRESTO 24-26 MG PO TABS: 1.0000 | ORAL_TABLET | Freq: Two times a day (BID) | ORAL | 0 refills | Status: DC

## 2021-01-15 NOTE — Patient Instructions (Addendum)
It was nice meeting you today!  We would like to have your blood pressure less than 130/80  Continue your metorpolol 25mg  once a day and your Jardiance 10mg  once a day  Discontinue losartan   We are going to start a new medication called Entresto which you you will take twice a day (every 12 hours)  Watch your salt intake and start to weigh yourself at home.  Please call if you gain more than 3 pounds overnight or 5 pounds in a week  We need to update your lab work in 2 weeks  , PharmD, BCACP, CDCES, CPP [facility] [address] [place], [address] [place] Phone: [phone]; Fax: [phone] 01/15/2021 2:58 PM

## 2021-01-15 NOTE — Progress Notes (Signed)
Patient ID: Keith Schwartz                 DOB: 1951-02-27                      MRN: [id number]     HPI: Keith Schwartz is a 70 y.o. male referred by Dr. Bjorn Pippin to HTN clinic. PMH is significant for CHF, CAD, HTN, and DM.  Patient is retired from [employer] and has care at [facility] in [place].  Had syncopal episode at the end of 2021 and referred to cardiology and stress test showed EF of 24% and signs of a previous MI that patient was not aware of. Patient seen by Dr. Bjorn Pippin on 3/15 and started on losartan and metoprolol succinate and referred for HF management.  Patient presents today with wife.  [place]ives in [place] [place]  Husband does the cooking in the house and tries to cook mostly vegetables and proteins. Hunts for food and for exercise. Was previously more active but now is limited due to pain. Knows to watch diet due to T2DM.  Does not own a scale at home.  Has been tolerating metoprolol and losartan but has not been monitoring his blood pressure or pulse at home.  Reports he feels the same.  Had lab work on 3/24 which was normal other than elevated glucose.  Current HTN meds: losartan 12.5mg  daily, metoprolol 25mg  daily, jardiance 10mg  Previously tried:  BP goal: <130/80   Wt Readings from Last 3 Encounters:  12/30/20 263 lb 6.4 oz (119.5 kg)  12/22/20 255 lb (115.7 kg)  12/12/20 262 lb 6.4 oz (119 kg)   BP Readings from Last 3 Encounters:  12/30/20 132/76  12/22/20 136/64  12/12/20 132/60   Pulse Readings from Last 3 Encounters:  12/30/20 80  12/22/20 66  12/12/20 86    Renal function: CrCl cannot be calculated (Unknown ideal weight.).  Past Medical History:  Diagnosis Date  . Diabetes mellitus without complication (HCC) 2002  . Hypertension 2002    Current Outpatient Medications on File Prior to Visit  Medication Sig Dispense Refill  . Ascorbic Acid (VITAMIN C) 1000 MG tablet Take 1,000 mg by mouth daily.    2003 aspirin 81 MG EC tablet  Take 81 mg by mouth daily.    2003 atorvastatin (LIPITOR) 80 MG tablet Take 1 tablet (80 mg total) by mouth daily. 90 tablet 3  . clotrimazole-betamethasone (LOTRISONE) cream Apply 1 application topically 2 (two) times daily. 30 g 2  . diclofenac Sodium (VOLTAREN) 1 % GEL Apply 1 application topically daily as needed for pain.    Marland Kitchen glimepiride (AMARYL) 4 MG tablet Take 4 mg by mouth 2 (two) times daily.    . Glucose Blood (BLOOD GLUCOSE TEST STRIPS) STRP Please dispense based on patient and insurance preference. Use as directed to monitor FSBS 3x daily. Dx: Marland Kitchen. 200 each 1  . hydrOXYzine (ATARAX/VISTARIL) 25 MG tablet Take 1 tablet (25 mg total) by mouth 3 (three) times daily as needed. (Patient taking differently: Take 50 mg by mouth at bedtime.) 90 tablet 1  . losartan (COZAAR) 25 MG tablet Take 0.5 tablets (12.5 mg total) by mouth daily. 45 tablet 3  . magnesium oxide (MAG-OX) 400 MG tablet Take 400 mg by mouth daily.    . melatonin 5 MG TABS Take 10 mg by mouth at bedtime.    . metFORMIN (GLUCOPHAGE) 1000 MG tablet TAKE 1 TABLET TWICE  A DAY WITH MEALS (Patient taking differently: Take 1,000 mg by mouth 2 (two) times daily with a meal.) 60 tablet 0  . metoprolol succinate (TOPROL XL) 25 MG 24 hr tablet Take 1 tablet (25 mg total) by mouth daily. 90 tablet 3  . Multiple Vitamin (MULTIVITAMIN) tablet Take 1 tablet by mouth daily. MEN'S ONE A DAY 50+    . nitroGLYCERIN (NITROSTAT) 0.4 MG SL tablet Place 1 tablet (0.4 mg total) under the tongue every 5 (five) minutes as needed for chest pain. 25 tablet 3  . pramipexole (MIRAPEX) 1 MG tablet Take 1 mg by mouth at bedtime.    Marland Kitchen trimethoprim-polymyxin b (POLYTRIM) ophthalmic solution Place 1 drop into both eyes every 4 (four) hours. 10 mL 0  . TRULICITY 1.5 MG/0.5ML SOPN INJECT 1 PEN UNDER THE SKIN ONCE A WEEK (Patient taking differently: Inject 1.5 mg as directed once a week.) 6 mL 3  . vardenafil (LEVITRA) 20 MG tablet Take 20 mg by mouth as needed for  erectile dysfunction.    . Vitamin D3 (VITAMIN D) 25 MCG tablet Take 1,000 Units by mouth daily.    . vitamin E 400 UNIT capsule Take 400 Units by mouth daily.    Marland Kitchen zinc gluconate 50 MG tablet Take 50 mg by mouth daily.     Current Facility-Administered Medications on File Prior to Visit  Medication Dose Route Frequency Provider Last Rate Last Admin  . sodium chloride flush (NS) 0.9 % injection 3 mL  3 mL Intravenous Q12H Cleaver, Thomasene Ripple, NP        Allergies  Allergen Reactions  . Glipizide Other (See Comments)    Eruption, Urticaria (Hives), Eruption, Urticaria (Rash)  . Penicillins Other (See Comments)    Add as: Penicillin G/ Childhood Eruption (Rash)       Assessment/Plan:  1. CHF - Patient's BP in room today 130/80 which is right at goal with pulse of 75.  Explained briefly the pathophysiology of HF and the role each medication plays.    Emphasized the importance of self monitoring blood pressure, weight, as well as blood sugar. Encouraged patient to purchase a new BP cuff and scale and to call if he gains more than 3# overnight or 5# in a week.  Since patient has been tolerating losartan, will change to Entresto 24-26 BID.  Gave patient free 30 day card and will complete PA if required.  Placed order for recheck BMP for patient to have drawn at [facility] and will recheck with patient in 2 weeks.  Patient voiced understanding.  Laural Golden, PharmD, BCACP, CDCES, CPP [facility] [address] [place], [place] [address] Phone: [phone]; Fax: [phone] 01/16/2021 4:46 PM

## 2021-01-28 ENCOUNTER — Other Ambulatory Visit: Payer: Self-pay | Admitting: Cardiology

## 2021-01-28 DIAGNOSIS — I5042 Chronic combined systolic (congestive) and diastolic (congestive) heart failure: Secondary | ICD-10-CM | POA: Diagnosis not present

## 2021-01-29 ENCOUNTER — Encounter: Payer: Self-pay | Admitting: *Deleted

## 2021-01-29 LAB — BASIC METABOLIC PANEL
BUN/Creatinine Ratio: 20 (ref 10–24)
BUN: 25 mg/dL (ref 8–27)
CO2: 22 mmol/L (ref 20–29)
Calcium: 9.9 mg/dL (ref 8.6–10.2)
Chloride: 99 mmol/L (ref 96–106)
Creatinine, Ser: 1.27 mg/dL (ref 0.76–1.27)
Glucose: 171 mg/dL — ABNORMAL HIGH (ref 65–99)
Potassium: 4.6 mmol/L (ref 3.5–5.2)
Sodium: 138 mmol/L (ref 134–144)
eGFR: 61 mL/min/{1.73_m2} (ref >59)

## 2021-01-29 LAB — SPECIMEN STATUS REPORT

## 2021-02-05 ENCOUNTER — Ambulatory Visit (INDEPENDENT_AMBULATORY_CARE_PROVIDER_SITE_OTHER): Payer: Medicare Other | Admitting: Pharmacist

## 2021-02-05 ENCOUNTER — Other Ambulatory Visit: Payer: Self-pay

## 2021-02-05 DIAGNOSIS — I5022 Chronic systolic (congestive) heart failure: Secondary | ICD-10-CM

## 2021-02-05 MED ORDER — ENTRESTO 49-51 MG PO TABS: 1.0000 | ORAL_TABLET | Freq: Two times a day (BID) | ORAL | 1 refills | Status: DC

## 2021-02-05 NOTE — Progress Notes (Signed)
Patient ID: Keith Schwartz                 DOB: December 31, 1950                      MRN: [id number]     HPI: Keith Schwartz is a 70 y.o. male referred by Dr. Bjorn Schwartz to pharmacy clinic for HF medication management. PMH is significant for CAD, chronic combined systolic and diastolic CHF, DM, and HTN. Had an episode of syncope 10/16/20 where BP dropped to 90s/60s and pt passed out at [facility]. EKG in ED showed LBBB and SCr of 1.75, lisinopril was stopped. Most recent LVEF 30% on 12/09/20 echo with normal RV function. Lexiscan Myoview on 12/09/2020 showed large severe fixed defect involving inferior and anterior walls with preservation of lateral wall and LVEF 24%, suggesting extensive infarct. Cardiac catheterization on 12/22/2020 showed diffusely diseased LAD with positive RFR, nonobstructive LCx stenosis, mild diffuse RCA disease, normal right heart pressures.  Zio patch x14 days on 12/15/2020 showed 1 episode of NSVT lasting 4 beats, 3 episodes of SVT longest lasting 10 beats. Lower extremity ultrasound was negative for PAD.  At visit with Dr Keith Schwartz on 3/15, Toprol 25mg  daily and losartan 12.5mg  daily were started and atorvastatin was increased to 80mg  daily. Pt was seen by PharmD on 3/31 where BP was stable at 130/80, HR 75. He was changed from losartan to [facility]. Follow up BMET 2 weeks later was stable.  Pt presents today with his wife. Reports tolerating medications well. Denies dizziness, headache, and LEE. Occasional SOB but nothing out of the ordinary. Uses a pill box to organize his meds which his wife helps with. Weight at home is about 250 lbs. Has checked BP occasionally using a bicep cuff he purchased about 1 month ago. Recalls readings ~130/70.  Current CHF meds:  Toprol 25mg  daily Entresto 24-26mg  BID Jardiance 10mg  daily  BP goal: <130/54mmHg  Family History:  Father had MI and died of CHF in 103s. Arthritis in his mother; Asthma in his mother; Early death in his sister; Hearing loss  in his mother; Heart disease in his father; Hyperlipidemia in his father; Hypertension in his father; Vision loss in his mother.  Social History: Denies tobacco use, occasional alcohol use, denies drug use. Retired from , receives care at the in [place], 71[place].  Diet: Cooks mostly vegetables and proteins. Hunts for food and for exercise.  Exercise: Limited due to pain.  Home BP readings: 130/70s  Wt Readings from Last 3 Encounters:  01/15/21 259 lb 9.6 oz (117.8 kg)  12/30/20 263 lb 6.4 oz (119.5 kg)  12/22/20 255 lb (115.7 kg)   BP Readings from Last 3 Encounters:  01/15/21 130/80  12/30/20 132/76  12/22/20 136/64   Pulse Readings from Last 3 Encounters:  01/15/21 75  12/30/20 80  12/22/20 66    Renal function: CrCl cannot be calculated (Unknown ideal weight.).  Past Medical History:  Diagnosis Date  . Diabetes mellitus without complication (HCC) 2002  . Hypertension 2002    Current Outpatient Medications on File Prior to Visit  Medication Sig Dispense Refill  . Ascorbic Acid (VITAMIN C) 1000 MG tablet Take 1,000 mg by mouth daily.    01/01/21 aspirin 81 MG EC tablet Take 81 mg by mouth daily.    02/21/21 atorvastatin (LIPITOR) 80 MG tablet Take 1 tablet (80 mg total) by mouth daily. 90 tablet 3  . clotrimazole-betamethasone (LOTRISONE) cream Apply 1  application topically 2 (two) times daily. 30 g 2  . diclofenac Sodium (VOLTAREN) 1 % GEL Apply 1 application topically daily as needed for pain.    Marland Kitchen glimepiride (AMARYL) 4 MG tablet Take 4 mg by mouth 2 (two) times daily.    . Glucose Blood (BLOOD GLUCOSE TEST STRIPS) STRP Please dispense based on patient and insurance preference. Use as directed to monitor FSBS 3x daily. Dx: Fletcher.Ros. 200 each 1  . hydrOXYzine (ATARAX/VISTARIL) 25 MG tablet Take 1 tablet (25 mg total) by mouth 3 (three) times daily as needed. (Patient taking differently: Take 50 mg by mouth at bedtime.) 90 tablet 1  . JARDIANCE 10 MG TABS tablet Take 10 mg by  mouth daily.    . magnesium oxide (MAG-OX) 400 MG tablet Take 400 mg by mouth daily.    . melatonin 5 MG TABS Take 10 mg by mouth at bedtime.    . metFORMIN (GLUCOPHAGE) 1000 MG tablet TAKE 1 TABLET TWICE A DAY WITH MEALS (Patient taking differently: Take 1,000 mg by mouth 2 (two) times daily with a meal.) 60 tablet 0  . metoprolol succinate (TOPROL XL) 25 MG 24 hr tablet Take 1 tablet (25 mg total) by mouth daily. 90 tablet 3  . Multiple Vitamin (MULTIVITAMIN) tablet Take 1 tablet by mouth daily. MEN'S ONE A DAY 50+    . nitroGLYCERIN (NITROSTAT) 0.4 MG SL tablet Place 1 tablet (0.4 mg total) under the tongue every 5 (five) minutes as needed for chest pain. 25 tablet 3  . pramipexole (MIRAPEX) 1 MG tablet Take 1 mg by mouth at bedtime.    . sacubitril-valsartan (ENTRESTO) 24-26 MG Take 1 tablet by mouth 2 (two) times daily. 60 tablet 0  . TRULICITY 1.5 MG/0.5ML SOPN INJECT 1 PEN UNDER THE SKIN ONCE A WEEK (Patient taking differently: Inject 1.5 mg as directed once a week.) 6 mL 3  . vardenafil (LEVITRA) 20 MG tablet Take 20 mg by mouth as needed for erectile dysfunction.    . Vitamin D3 (VITAMIN D) 25 MCG tablet Take 1,000 Units by mouth daily.    . vitamin E 400 UNIT capsule Take 400 Units by mouth daily.    Marland Kitchen zinc gluconate 50 MG tablet Take 50 mg by mouth daily.     No current facility-administered medications on file prior to visit.    Allergies  Allergen Reactions  . Glipizide Other (See Comments)    Eruption, Urticaria (Hives), Eruption, Urticaria (Rash)  . Penicillins Other (See Comments)    Add as: Penicillin G/ Childhood Eruption (Rash)       Assessment/Plan:  1. CHF - BP at goal <130/73mmHg with room to further optimize CHF regimen. Will increase Entresto to 49-51mg  BID and continue Toprol 25mg  daily and Jardiance 10mg  daily. Pt sees Dr on 5/2 for follow up. Will need BMET checked at that time and hopefully will have BP room to further titrate Toprol or start  spironolactone to continue with CHF med optimization.   Rashan Patient E. Osric Klopf, PharmD, BCACP, CPP [facility] [address] [place], [address] [place] Phone: [phone]; Fax: [phone] 02/05/2021 2:48 PM

## 2021-02-05 NOTE — Patient Instructions (Addendum)
It was nice to see you today, your blood pressure is excellent  Increase your Entresto to 49-51mg  1 tablet twice daily. You can double up on your current supply of 24-26mg  and take 2 tablets twice daily to use them up.  Continue taking your other medications. Start checking your blood pressure at home and record your readings  Keep your follow up visit with Dr Bjorn Pippin on 5/2, he will recheck labs that day and hopefully we can increase the dose on your metoprolol  We'll follow up with you afterwards

## 2021-02-12 ENCOUNTER — Other Ambulatory Visit: Payer: Self-pay | Admitting: Cardiology

## 2021-02-12 DIAGNOSIS — I5042 Chronic combined systolic (congestive) and diastolic (congestive) heart failure: Secondary | ICD-10-CM

## 2021-02-15 NOTE — Progress Notes (Deleted)
Cardiology Office Note:    Date:  02/15/2021   ID:  MANN SKAGGS, DOB 1951-04-10, MRN [id number]  PCP:  Donita Brooks, MD  Cardiologist:  Little Ishikawa, MD  Electrophysiologist:  None   Referring MD: Donita Brooks, MD   No chief complaint on file.   History of Present Illness:    Keith Schwartz is a 70 y.o. male with a hx of CAD, chronic combined systolic and also heart failure, T2DM, hypertension who presents for follow-up.  He was referred by Dr. Tanya Nones for evaluation of syncope, initially seen on 11/12/2020.  He had an episode of syncope on 10/16/2020.  Reports he had bronchitis at the time and had taken DayQuil that morning, in addition to taking lisinopril.  He was at [facility] and had checked his blood pressure and noted it was low, 90s over 60s.  Subsequently was standing in the checkout line and suddenly passed out.  Denies any prodromal symptoms.  States that he fell to the ground, did not hit his head.  Thinks he was unconscious only for a few seconds.  EMS was called and reportedly BP was down to 70s.  He received IV fluids and BP up to 90s on arrival to ED.  In ED, EKG showed left bundle branch block.  Labs notable for creatinine 1.75.  He has not taken lisinopril since discharge from ED.  He denies any lightheadedness or syncope since this episode, or prior to that.  Denies any palpitations.  Does report he has been having chest pain.  Describes as pressure in center of upper chest that occurs with exertion.  States that he will continue to exert himself and it will subside, typically last for 1 to 2 minutes.  No smoking history.  Family history includes father had MI and died of CHF in 42s.  Echocardiogram on 12/09/2020 showed EF 30%, normal RV function.  Lexiscan Myoview on 12/09/2020 showed large severe fixed defect involving inferior and anterior walls with preservation of lateral wall and LVEF 24%, suggesting extensive infarct.  Cardiac catheterization on  12/22/2020 showed diffusely diseased LAD with positive RFR, nonobstructive LCx stenosis, mild diffuse RCA disease, normal right heart pressures.  Medical therapy recommended.  Zio patch x14 days on 12/15/2020 showed 1 episode of NSVT lasting 4 beats, 3 episodes of SVT longest lasting 10 beats.   Since his last clinic visit,  Has had some low BP, down to 88/54 at home.    aldactone  he reports that he has been doing okay.  Does report chest pain that he describes as pressure if he does heavy exertion.  Reports that he was carrying 50 pound bags of chicken feed the other day and had chest pain with this.  Has not had to take any nitroglycerin.  Does report shortness of breath with exertion.  Denies any lightheadedness, syncope, palpitations, lower extremity edema.  Reports he stopped taking his atorvastatin 2 to 3 months ago.  Also reports that he has pain in both legs and had previously been told at the [place] that he had PAD.   Past Medical History:  Diagnosis Date  . Diabetes mellitus without complication (HCC) 2002  . Hypertension 2002    Past Surgical History:  Procedure Laterality Date  . APPENDECTOMY  1970  . INTRAVASCULAR PRESSURE WIRE/FFR STUDY N/A 12/22/2020   Procedure: INTRAVASCULAR PRESSURE WIRE/FFR STUDY;  Surgeon: Tonny Bollman, MD;  Location: [facility] INVASIVE CV LAB;  Service: Cardiovascular;  Laterality: N/A;  . JOINT  REPLACEMENT  2012   rt knee  . RIGHT/LEFT HEART CATH AND CORONARY ANGIOGRAPHY N/A 12/22/2020   Procedure: RIGHT/LEFT HEART CATH AND CORONARY ANGIOGRAPHY;  Surgeon: Tonny Bollman, MD;  Location: [facility] INVASIVE CV LAB;  Service: Cardiovascular;  Laterality: N/A;  . VASECTOMY  1986    Current Medications: No outpatient medications have been marked as taking for the 02/16/21 encounter (Appointment) with Little Ishikawa, MD.     Allergies:   Glipizide and Penicillins   Social History   Socioeconomic History  . Marital status: Married    Spouse name: Not on file   . Number of children: Not on file  . Years of education: Not on file  . Highest education level: Not on file  Occupational History  . Not on file  Tobacco Use  . Smoking status: Never Smoker  . Smokeless tobacco: Never Used  Substance and Sexual Activity  . Alcohol use: Yes    Alcohol/week: 1.0 standard drink    Types: 1 Cans of beer per week  . Drug use: Never  . Sexual activity: Yes    Birth control/protection: Surgical  Other Topics Concern  . Not on file  Social History Narrative  . Not on file   Social Determinants of Health   Financial Resource Strain: Not on file  Food Insecurity: Not on file  Transportation Needs: Not on file  Physical Activity: Not on file  Stress: Not on file  Social Connections: Not on file     Family History: The patient's family history includes Arthritis in his mother; Asthma in his mother; Early death in his sister; Hearing loss in his mother; Heart disease in his father; Hyperlipidemia in his father; Hypertension in his father; Vision loss in his mother.  ROS:   Please see the history of present illness.     All other systems reviewed and are negative.  EKGs/Labs/Other Studies Reviewed:    The following studies were reviewed today:  EKG:  EKG is ordered today.  The ekg ordered today demonstrates normal sinus rhythm, rate 80, left bundle branch block  Recent Labs: 12/18/2020: Platelets 229 12/22/2020: Hemoglobin 14.6; Hemoglobin 15.0 01/08/2021: ALT 28 01/28/2021: BUN 25; Creatinine, Ser 1.27; Potassium 4.6; Sodium 138  Recent Lipid Panel    Component Value Date/Time   CHOL 185 11/12/2020 1528   TRIG 254 (H) 11/12/2020 1528   HDL 32 (L) 11/12/2020 1528   CHOLHDL 5.8 (H) 11/12/2020 1528   LDLCALC 109 (H) 11/12/2020 1528    Physical Exam:    VS:  There were no vitals taken for this visit.    Wt Readings from Last 3 Encounters:  02/05/21 257 lb (116.6 kg)  01/15/21 259 lb 9.6 oz (117.8 kg)  12/30/20 263 lb 6.4 oz (119.5 kg)      GEN: Well nourished, well developed in no acute distress HEENT: Normal NECK: No JVD; No carotid bruits CARDIAC:RRR, 2 out of 6 systolic murmur RESPIRATORY:  Clear to auscultation without rales, wheezing or rhonchi  ABDOMEN: Soft, non-tender, non-distended MUSCULOSKELETAL:  No edema; No deformity  SKIN: Warm and dry NEUROLOGIC:  Alert and oriented x 3 PSYCHIATRIC:  Normal affect   ASSESSMENT:    No diagnosis found. PLAN:    CAD: Reported chest pain concerning for angina and EKG with left bundle branch block.  Echocardiogram on 12/09/2020 showed EF 30%, normal RV function.  Lexiscan Myoview on 12/09/2020 showed large severe fixed defect involving inferior and anterior walls with preservation of lateral wall and LVEF  24%, suggesting extensive infarct.  Cardiac catheterization on 12/22/2020 showed diffusely diseased LAD with positive RFR, nonobstructive LCx stenosis, mild diffuse RCA disease, normal right heart pressures.  Medical management recommended for obstructive LAD disease.  Currently reporting chest pain with significant exertion. -Continue aspirin 81 mg daily -Continue atorvastatin to 80 mg daily -Continue Toprol-XL 25 mg daily  Chronic combined systolic and diastolic heart failure: EF 30% on echocardiogram 12/09/2020.  Diffuse severe LAD disease as above, medical management recommended -Continue Entresto 49-51 mg twice daily -Continue Toprol-XL 25 mg daily -Continue Jardiance 10 mg daily -Add aldactone -Recent syncopal episode felt to be due to hypotension.  Will monitor BP on these medications and continue to titrate as tolerated.   -Repeat echo in 3 months, if no improvement in systolic function would plan EP referral for ICD  Syncope: Suspect due to hypotension, which may have been due to dehydration in setting of URI and continuing to take lisinopril.  SBP in 70s on EMS arrival.  No prodromal symptoms, arrhythmia remains on differential.  Echocardiogram on 12/09/2020 showed EF  30%, normal RV function.   Zio patch x14 days on 12/15/2020 showed 1 episode of NSVT lasting 4 beats, 3 episodes of SVT longest lasting 10 beats.  Hypertension: Continue Entresto and Toprol-XL as above  Hyperlipidemia: On atorvastatin 40 mg daily.  LDL 109 on 11/12/2020.  Increased atorvastatin to 80 mg  T2DM: On metformin, glimepiride, Trulicity  AKI: Creatinine 1.75 on 10/16/2020, improved to creatinine 1.27 on 3/3  Leg pain: ABIs indicate noncompressible vessels, normal TBI's on 01/08/2021.  Lower extremity duplex shows heavy calcifications, questionable distal occlusion in the left posterior tibial artery.   RTC in ***     Medication Adjustments/Labs and Tests Ordered: Current medicines are reviewed at length with the patient today.  Concerns regarding medicines are outlined above.  No orders of the defined types were placed in this encounter.  No orders of the defined types were placed in this encounter.   There are no Patient Instructions on file for this visit.   Signed, Little Ishikawa, MD  02/15/2021 2:56 PM    [facility]

## 2021-02-16 ENCOUNTER — Encounter: Payer: Self-pay | Admitting: Cardiology

## 2021-02-16 ENCOUNTER — Ambulatory Visit (INDEPENDENT_AMBULATORY_CARE_PROVIDER_SITE_OTHER): Payer: Medicare Other | Admitting: Cardiology

## 2021-02-16 ENCOUNTER — Other Ambulatory Visit: Payer: Self-pay

## 2021-02-16 VITALS — BP 108/74 | HR 79 | Ht 76.0 in | Wt 254.0 lb

## 2021-02-16 DIAGNOSIS — R55 Syncope and collapse: Secondary | ICD-10-CM

## 2021-02-16 DIAGNOSIS — I10 Essential (primary) hypertension: Secondary | ICD-10-CM | POA: Diagnosis not present

## 2021-02-16 DIAGNOSIS — E785 Hyperlipidemia, unspecified: Secondary | ICD-10-CM

## 2021-02-16 DIAGNOSIS — I2510 Atherosclerotic heart disease of native coronary artery without angina pectoris: Secondary | ICD-10-CM | POA: Diagnosis not present

## 2021-02-16 DIAGNOSIS — I5042 Chronic combined systolic (congestive) and diastolic (congestive) heart failure: Secondary | ICD-10-CM | POA: Diagnosis not present

## 2021-02-16 NOTE — Progress Notes (Signed)
Cardiology Office Note:    Date:  02/16/2021   ID:  Keith Schwartz, DOB [birth year], MRN [id number]  PCP:  Donita Brooks, MD  Cardiologist:  Little Ishikawa, MD  Electrophysiologist:  None   Referring MD: Donita Brooks, MD   Chief Complaint  Patient presents with  . Coronary Artery Disease    History of Present Illness:    Keith Schwartz is a 70 y.o. male with a hx of CAD, chronic combined systolic and also heart failure, T2DM, hypertension who presents for follow-up.  He was referred by Dr. Tanya Nones for evaluation of syncope, initially seen on 11/12/2020.  He had an episode of syncope on 10/16/2020.  Reports he had bronchitis at the time and had taken DayQuil that morning, in addition to taking lisinopril.  He was at [facility] and had checked his blood pressure and noted it was low, 90s over 60s.  Subsequently was standing in the checkout line and suddenly passed out.  Denies any prodromal symptoms.  States that he fell to the ground, did not hit his head.  Thinks he was unconscious only for a few seconds.  EMS was called and reportedly BP was down to 70s.  He received IV fluids and BP up to 90s on arrival to ED.  In ED, EKG showed left bundle branch block.  Labs notable for creatinine 1.75.  He has not taken lisinopril since discharge from ED.  He denies any lightheadedness or syncope since this episode, or prior to that.  Denies any palpitations.  Does report he has been having chest pain.  Describes as pressure in center of upper chest that occurs with exertion.  States that he will continue to exert himself and it will subside, typically last for 1 to 2 minutes.  No smoking history.  Family history includes father had MI and died of CHF in 41s.  Echocardiogram on 12/09/2020 showed EF 30%, normal RV function.  Lexiscan Myoview on 12/09/2020 showed large severe fixed defect involving inferior and anterior walls with preservation of lateral wall and LVEF 24%, suggesting extensive  infarct.  Cardiac catheterization on 12/22/2020 showed diffusely diseased LAD with positive RFR, nonobstructive LCx stenosis, mild diffuse RCA disease, normal right heart pressures.  Medical therapy recommended.  Zio patch x14 days on 12/15/2020 showed 1 episode of NSVT lasting 4 beats, 3 episodes of SVT longest lasting 10 beats.   Today, he is accompanied by his wife. Since his last clinic visit, he reports that he has been doing okay as far as he can tell. His at home blood pressure has ranged from 88/54 to 138/69. However, he feels fine even when his blood pressure is at its lowest. Occasionally he has some chest pain and shortness of breath that starts when he becomes active, and then it soon dissipates after taking a break. When this occurs, he does not consider himself as doing something strenuous, and he will stop as soon as these episodes occur. He has not needed to take nitroglycerin. Also, he has noticed becoming dizzy after he looks straight up while standing. Denies any palpitations or LE edema. No orthopnea or PND.      Past Medical History:  Diagnosis Date  . Diabetes mellitus without complication (HCC) 2002  . Hypertension 2002    Past Surgical History:  Procedure Laterality Date  . APPENDECTOMY  1970  . INTRAVASCULAR PRESSURE WIRE/FFR STUDY N/A 12/22/2020   Procedure: INTRAVASCULAR PRESSURE WIRE/FFR STUDY;  Surgeon: Tonny Bollman, MD;  Location:  [facility] INVASIVE CV LAB;  Service: Cardiovascular;  Laterality: N/A;  . JOINT REPLACEMENT  2012   rt knee  . RIGHT/LEFT HEART CATH AND CORONARY ANGIOGRAPHY N/A 12/22/2020   Procedure: RIGHT/LEFT HEART CATH AND CORONARY ANGIOGRAPHY;  Surgeon: Tonny Bollman, MD;  Location: [facility] INVASIVE CV LAB;  Service: Cardiovascular;  Laterality: N/A;  . VASECTOMY  1986    Current Medications: Current Meds  Medication Sig  . Ascorbic Acid (VITAMIN C) 1000 MG tablet Take 1,000 mg by mouth daily.  Marland Kitchen aspirin 81 MG EC tablet Take 81 mg by mouth daily.  Marland Kitchen  atorvastatin (LIPITOR) 80 MG tablet Take 1 tablet (80 mg total) by mouth daily.  . clotrimazole-betamethasone (LOTRISONE) cream Apply 1 application topically 2 (two) times daily.  . diclofenac Sodium (VOLTAREN) 1 % GEL Apply 1 application topically daily as needed for pain.  Marland Kitchen glimepiride (AMARYL) 4 MG tablet Take 4 mg by mouth 2 (two) times daily.  . Glucose Blood (BLOOD GLUCOSE TEST STRIPS) STRP Please dispense based on patient and insurance preference. Use as directed to monitor FSBS 3x daily. Dx: Z5638.  . hydrOXYzine (ATARAX/VISTARIL) 25 MG tablet Take 1 tablet (25 mg total) by mouth 3 (three) times daily as needed. (Patient taking differently: Take 50 mg by mouth at bedtime.)  . JARDIANCE 10 MG TABS tablet Take 10 mg by mouth daily.  . magnesium oxide (MAG-OX) 400 MG tablet Take 400 mg by mouth daily.  . melatonin 5 MG TABS Take 10 mg by mouth at bedtime.  . metFORMIN (GLUCOPHAGE) 1000 MG tablet TAKE 1 TABLET TWICE A DAY WITH MEALS (Patient taking differently: Take 1,000 mg by mouth 2 (two) times daily with a meal.)  . metoprolol succinate (TOPROL XL) 25 MG 24 hr tablet Take 1 tablet (25 mg total) by mouth daily.  . Multiple Vitamin (MULTIVITAMIN) tablet Take 1 tablet by mouth daily. MEN'S ONE A DAY 50+  . pramipexole (MIRAPEX) 1 MG tablet Take 1 mg by mouth at bedtime.  . sacubitril-valsartan (ENTRESTO) 49-51 MG Take 1 tablet by mouth 2 (two) times daily.  . TRULICITY 1.5 MG/0.5ML SOPN INJECT 1 PEN UNDER THE SKIN ONCE A WEEK (Patient taking differently: Inject 1.5 mg as directed once a week.)  . vardenafil (LEVITRA) 20 MG tablet Take 20 mg by mouth as needed for erectile dysfunction.  . Vitamin D3 (VITAMIN D) 25 MCG tablet Take 1,000 Units by mouth daily.  . vitamin E 400 UNIT capsule Take 400 Units by mouth daily.  Marland Kitchen zinc gluconate 50 MG tablet Take 50 mg by mouth daily.     Allergies:   Glipizide and Penicillins   Social History   Socioeconomic History  . Marital status: Married     Spouse name: Not on file  . Number of children: Not on file  . Years of education: Not on file  . Highest education level: Not on file  Occupational History  . Not on file  Tobacco Use  . Smoking status: Never Smoker  . Smokeless tobacco: Never Used  Substance and Sexual Activity  . Alcohol use: Yes    Alcohol/week: 1.0 standard drink    Types: 1 Cans of beer per week  . Drug use: Never  . Sexual activity: Yes    Birth control/protection: Surgical  Other Topics Concern  . Not on file  Social History Narrative  . Not on file   Social Determinants of Health   Financial Resource Strain: Not on file  Food Insecurity: Not on file  Transportation Needs: Not on file  Physical Activity: Not on file  Stress: Not on file  Social Connections: Not on file     Family History: The patient's family history includes Arthritis in his mother; Asthma in his mother; Early death in his sister; Hearing loss in his mother; Heart disease in his father; Hyperlipidemia in his father; Hypertension in his father; Vision loss in his mother.  ROS:   Please see the history of present illness. (+) Chest pain (+) Shortness of breath All other systems reviewed and are negative.  EKGs/Labs/Other Studies Reviewed:    The following studies were reviewed today:  EKG:   12/30/2020: normal sinus rhythm, rate 80, left bundle branch block 02/16/2021: EKG is not ordered today.   Recent Labs: 12/18/2020: Platelets 229 12/22/2020: Hemoglobin 14.6; Hemoglobin 15.0 01/08/2021: ALT 28 01/28/2021: BUN 25; Creatinine, Ser 1.27; Potassium 4.6; Sodium 138  Recent Lipid Panel    Component Value Date/Time   CHOL 185 11/12/2020 1528   TRIG 254 (H) 11/12/2020 1528   HDL 32 (L) 11/12/2020 1528   CHOLHDL 5.8 (H) 11/12/2020 1528   LDLCALC 109 (H) 11/12/2020 1528    Physical Exam:    VS:  BP 108/74   Pulse 79   Ht 6\' 4"  (1.93 m)   Wt 254 lb (115.2 kg)   SpO2 97%   BMI 30.92 kg/m     Wt Readings from Last 3  Encounters:  02/16/21 254 lb (115.2 kg)  02/05/21 257 lb (116.6 kg)  01/15/21 259 lb 9.6 oz (117.8 kg)     GEN: Well nourished, well developed in no acute distress HEENT: Normal NECK: No JVD; No carotid bruits CARDIAC:RRR, 2 out of 6 systolic murmur RESPIRATORY:  Clear to auscultation without rales, wheezing or rhonchi  ABDOMEN: Soft, non-tender, non-distended MUSCULOSKELETAL:  No edema; No deformity  SKIN: Warm and dry NEUROLOGIC:  Alert and oriented x 3 PSYCHIATRIC:  Normal affect   ASSESSMENT:    1. CAD in native artery   2. Chronic combined systolic and diastolic heart failure (HCC)   3. Syncope, unspecified syncope type   4. Essential hypertension   5. Hyperlipidemia, unspecified hyperlipidemia type    PLAN:    CAD: Reported chest pain concerning for angina and EKG with left bundle branch block.  Echocardiogram on 12/09/2020 showed EF 30%, normal RV function.  Lexiscan Myoview on 12/09/2020 showed large severe fixed defect involving inferior and anterior walls with preservation of lateral wall and LVEF 24%, suggesting extensive infarct.  Cardiac catheterization on 12/22/2020 showed diffusely diseased LAD with positive RFR, nonobstructive LCx stenosis, mild diffuse RCA disease, normal right heart pressures.  Medical management recommended for obstructive LAD disease.  Reports occasional exertional chest pain, has not had to use nitroglycerin -Continue aspirin 81 mg daily -Continue atorvastatin to 80 mg daily -Continue Toprol-XL 25 mg daily -As needed sublingual nitroglycerin.  Chronic combined systolic and diastolic heart failure: EF 30% on echocardiogram 12/09/2020.  Diffuse severe LAD disease as above, medical management recommended -Continue Entresto 49-51 mg twice daily -Continue Toprol-XL 25 mg daily -Continue Jardiance 10 mg daily -Will check BMP.  If stable kidney function/potassium and remind BP, plan to add spironolactone 12.5 mg daily -Repeat echo in 3 months, if no  improvement in systolic function would plan EP referral for ICD  Syncope: Suspect due to hypotension, which may have been due to dehydration in setting of URI and continuing to take lisinopril.  SBP in 70s on EMS arrival.  No prodromal symptoms,  arrhythmia remains on differential.  Echocardiogram on 12/09/2020 showed EF 30%, normal RV function.   Zio patch x14 days on 12/15/2020 showed 1 episode of NSVT lasting 4 beats, 3 episodes of SVT longest lasting 10 beats.  Hypertension: Continue Entresto and Toprol-XL as above  Hyperlipidemia: On atorvastatin 40 mg daily.  LDL 109 on 11/12/2020.  Increased atorvastatin to 80 mg  T2DM: On metformin, glimepiride, Trulicity  AKI: Creatinine 1.75 on 10/16/2020, improved to creatinine 1.27 on 3/3  Leg pain: ABIs indicate noncompressible vessels, normal TBI's on 01/08/2021.  Lower extremity duplex shows heavy calcifications, questionable distal occlusion in the left posterior tibial artery.   RTC in 3 months.     Medication Adjustments/Labs and Tests Ordered: Current medicines are reviewed at length with the patient today.  Concerns regarding medicines are outlined above.  Orders Placed This Encounter  Procedures  . Basic metabolic panel  . Magnesium  . ECHOCARDIOGRAM COMPLETE   No orders of the defined types were placed in this encounter.   Patient Instructions  Medication Instructions:  Your physician recommends that you continue on your current medications as directed. Please refer to the Current Medication list given to you today.   *If you need a refill on your cardiac medications before your next appointment, please call your pharmacy*   Lab Work: BMET, Mag today  If you have labs (blood work) drawn today and your tests are completely normal, you will receive your results only by: Marland Kitchen. MyChart Message (if you have MyChart) OR . A paper copy in the mail If you have any lab test that is abnormal or we need to change your treatment, we will  call you to review the results.   Testing/Procedures: Your physician has requested that you have an echocardiogram in 3 MONTHS. Echocardiography is a painless test that uses sound waves to create images of your heart. It provides your doctor with information about the size and shape of your heart and how well your heart's chambers and valves are working. This procedure takes approximately one hour. There are no restrictions for this procedure.  [facility]:  [employer]  Follow-Up: At [facility], you and your health needs are our priority.  As part of our continuing mission to provide you with exceptional heart care, we have created designated Provider Care Teams.  These Care Teams include your primary Cardiologist (physician) and Advanced Practice Providers (APPs -  Physician Assistants and Nurse Practitioners) who all work together to provide you with the care you need, when you need it.  We recommend signing up for the patient portal called "MyChart".  Sign up information is provided on this After Visit Summary.  MyChart is used to connect with patients for Virtual Visits (Telemedicine).  Patients are able to view lab/test results, encounter notes, upcoming appointments, etc.  Non-urgent messages can be sent to your provider as well.   To learn more about what you can do with MyChart, go to [email or web address].    Your next appointment:   3 month(s)  The format for your next appointment:   In Person  Provider:   Epifanio Lescheshristopher Holdyn Poyser, MD       [facility],Mathew Stumpf,acting as a scribe for Little Ishikawahristopher L Lunette Tapp, MD.,have documented all relevant documentation on the behalf of Little Ishikawahristopher L Jacquita Mulhearn, MD,as directed by  Little Ishikawahristopher L Jd Mccaster, MD while in the presence of Little Ishikawahristopher L Elida Harbin, MD.  I, Little Ishikawahristopher L Jyll Tomaro, MD, have reviewed  all documentation for this visit. The documentation on 02/16/21 for the exam, diagnosis,  procedures, and orders are all accurate and complete.   Signed, Little Ishikawa, MD  02/16/2021 6:03 PM    [facility]

## 2021-02-16 NOTE — Patient Instructions (Signed)
Medication Instructions:  Your physician recommends that you continue on your current medications as directed. Please refer to the Current Medication list given to you today.   *If you need a refill on your cardiac medications before your next appointment, please call your pharmacy*   Lab Work: BMET, Mag today  If you have labs (blood work) drawn today and your tests are completely normal, you will receive your results only by: Marland Kitchen MyChart Message (if you have MyChart) OR . A paper copy in the mail If you have any lab test that is abnormal or we need to change your treatment, we will call you to review the results.   Testing/Procedures: Your physician has requested that you have an echocardiogram in 3 MONTHS. Echocardiography is a painless test that uses sound waves to create images of your heart. It provides your doctor with information about the size and shape of your heart and how well your heart's chambers and valves are working. This procedure takes approximately one hour. There are no restrictions for this procedure.  This will be done at our [facility] location:  [employer] [address]  Follow-Up: At [facility], you and your health needs are our priority.  As part of our continuing mission to provide you with exceptional heart care, we have created designated Provider Care Teams.  These Care Teams include your primary Cardiologist (physician) and Advanced Practice Providers (APPs -  Physician Assistants and Nurse Practitioners) who all work together to provide you with the care you need, when you need it.  We recommend signing up for the patient portal called "MyChart".  Sign up information is provided on this After Visit Summary.  MyChart is used to connect with patients for Virtual Visits (Telemedicine).  Patients are able to view lab/test results, encounter notes, upcoming appointments, etc.  Non-urgent messages can be sent to your provider as well.   To learn more  about what you can do with MyChart, go to [email or web address].    Your next appointment:   3 month(s)  The format for your next appointment:   In Person  Provider:   Epifanio Lesches, MD

## 2021-02-17 ENCOUNTER — Other Ambulatory Visit: Payer: Self-pay | Admitting: *Deleted

## 2021-02-17 DIAGNOSIS — I5042 Chronic combined systolic (congestive) and diastolic (congestive) heart failure: Secondary | ICD-10-CM

## 2021-02-17 DIAGNOSIS — E875 Hyperkalemia: Secondary | ICD-10-CM

## 2021-02-17 LAB — BASIC METABOLIC PANEL
BUN/Creatinine Ratio: 17 (ref 10–24)
BUN: 26 mg/dL (ref 8–27)
CO2: 23 mmol/L (ref 20–29)
Calcium: 10.2 mg/dL (ref 8.6–10.2)
Chloride: 101 mmol/L (ref 96–106)
Creatinine, Ser: 1.54 mg/dL — ABNORMAL HIGH (ref 0.76–1.27)
Glucose: 167 mg/dL — ABNORMAL HIGH (ref 65–99)
Potassium: 5.4 mmol/L — ABNORMAL HIGH (ref 3.5–5.2)
Sodium: 139 mmol/L (ref 134–144)
eGFR: 48 mL/min/{1.73_m2} — ABNORMAL LOW (ref >59)

## 2021-02-17 LAB — MAGNESIUM: Magnesium: 1.9 mg/dL (ref 1.6–2.3)

## 2021-02-17 MED ORDER — ENTRESTO 24-26 MG PO TABS: 1.0000 | ORAL_TABLET | Freq: Two times a day (BID) | ORAL | 3 refills | Status: DC

## 2021-02-26 ENCOUNTER — Telehealth: Payer: Self-pay

## 2021-02-26 ENCOUNTER — Other Ambulatory Visit: Payer: Self-pay

## 2021-02-26 DIAGNOSIS — I5042 Chronic combined systolic (congestive) and diastolic (congestive) heart failure: Secondary | ICD-10-CM

## 2021-02-26 DIAGNOSIS — E875 Hyperkalemia: Secondary | ICD-10-CM | POA: Diagnosis not present

## 2021-02-26 NOTE — Telephone Encounter (Signed)
Called and reminded pt to complete their labs and they stated that they will come asap and forgot they didn't need an appt so they were grateful for the reminder

## 2021-02-26 NOTE — Telephone Encounter (Signed)
-----   Message from Awilda Metro, RPH-CPP sent at 02/26/2021  7:31 AM EDT ----- Elvina Sidle, this pt was supposed to come in earlier this week for a BMET at the [facility] office. Looks like an order is already entered. Would you be able to give him a call to remind him to come in? Thank you!

## 2021-02-27 LAB — BASIC METABOLIC PANEL
BUN/Creatinine Ratio: 19 (ref 10–24)
BUN: 24 mg/dL (ref 8–27)
CO2: 19 mmol/L — ABNORMAL LOW (ref 20–29)
Calcium: 10.1 mg/dL (ref 8.6–10.2)
Chloride: 102 mmol/L (ref 96–106)
Creatinine, Ser: 1.28 mg/dL — ABNORMAL HIGH (ref 0.76–1.27)
Glucose: 102 mg/dL — ABNORMAL HIGH (ref 65–99)
Potassium: 4.7 mmol/L (ref 3.5–5.2)
Sodium: 142 mmol/L (ref 134–144)
eGFR: 60 mL/min/{1.73_m2} (ref >59)

## 2021-03-05 DIAGNOSIS — M1812 Unilateral primary osteoarthritis of first carpometacarpal joint, left hand: Secondary | ICD-10-CM | POA: Diagnosis not present

## 2021-03-13 DIAGNOSIS — M1712 Unilateral primary osteoarthritis, left knee: Secondary | ICD-10-CM | POA: Diagnosis not present

## 2021-03-13 DIAGNOSIS — M25561 Pain in right knee: Secondary | ICD-10-CM | POA: Diagnosis not present

## 2021-03-20 ENCOUNTER — Telehealth: Payer: Self-pay | Admitting: *Deleted

## 2021-03-20 NOTE — Telephone Encounter (Signed)
Received call from patient wife Aram Beecham.   Reports that patient is scheduled for surgery on 04/23/2021 for knee arthoplasty.   Inquired if patient will require OV for surgical clearance.   Please advise.

## 2021-03-20 NOTE — Telephone Encounter (Signed)
He needs surgical clearance from the doctor/PCP managing his diabetes which is the [place].  We do not manage his chronic conditions.

## 2021-03-23 ENCOUNTER — Telehealth: Payer: Self-pay | Admitting: *Deleted

## 2021-03-23 NOTE — Telephone Encounter (Signed)
   [facility] Pre-operative Risk Assessment    Patient Name: Keith Schwartz  DOB: [birth year]  MRN: [id number]    Request for surgical clearance:  1. What type of surgery is being performed? Left total knee arthroplasty    2. When is this surgery scheduled? 04/23/21   3. What type of clearance is required (medical clearance vs. Pharmacy clearance to hold med vs. Both)? Both    4. Are there any medications that need to be held prior to surgery and how long? ASA   5. Practice name and name of physician performing surgery? [facility] Dr. Alvan Dame   6. What is the office phone number? [phone]   7.   What is the office fax number? [phone] attn: Orson Slick  8.   Anesthesia type (None, local, MAC, general) ? spinal   Nelsy Madonna A Uva Runkel 03/23/2021, 1:58 PM  _________________________________________________________________

## 2021-03-24 NOTE — Telephone Encounter (Signed)
Call placed to patient and patient made aware.  

## 2021-03-25 NOTE — Telephone Encounter (Signed)
Call attempted 6/8

## 2021-03-31 ENCOUNTER — Telehealth: Payer: Self-pay

## 2021-03-31 NOTE — Telephone Encounter (Signed)
Forms faxed to [facility] for pre op eval

## 2021-04-02 NOTE — Telephone Encounter (Signed)
Dr. Bjorn Pippin  This patient is to undergo a left TKA on 04/23/21. He has a significant cardiac hx including ischemic cardiomyopathy with an LVEF at 30%, normal RV function. Subsequent Lexiscan Myoview on 12/09/2020 showed large severe fixed defect involving inferior and anterior walls with preservation of lateral wall and LVEF 24%, suggesting extensive infarct. Cardiac catheterization on 12/22/2020 showed diffusely diseased LAD with positive RFR, nonobstructive LCx stenosis, mild diffuse RCA disease, normal right heart pressures.  Medical management recommended for obstructive LAD disease.    You last saw him in follow up 02/16/21 at which time he continued to have occasional exertional chest pain. Plan was to keep going with GDMT and repeat echo 05/2021.   Would you be ok with this patient holding ASA for surgery?   Please send recs to the pre op pool   Thank you

## 2021-04-03 NOTE — Telephone Encounter (Signed)
Given his obstructive CAD, would favor continuing aspirin 81 mg daily perioperatively

## 2021-04-10 NOTE — Patient Instructions (Addendum)
DUE TO COVID-19 ONLY ONE VISITOR IS ALLOWED TO COME WITH YOU AND STAY IN THE WAITING ROOM ONLY DURING PRE OP AND PROCEDURE DAY OF SURGERY.   TWO VISITOR  MAY VISIT WITH YOU AFTER SURGERY IN YOUR PRIVATE ROOM DURING VISITING HOURS ONLY until 8 pm   YOU NEED TO HAVE A COVID 19 TEST ON___7-15-22____ @__11 :00_____, THIS TEST MUST BE DONE BEFORE SURGERY,    COVID TESTING SITE [address] [place] [id number],   IT IS ON THE RIGHT GOING OUT [address] APPROXIMATELY  2 MINUTES PAST [employer] ON THE RIGHT. ONCE YOUR COVID TEST IS COMPLETED,  PLEASE Wear a mask when in public           Your procedure is scheduled on: 05-05-21   [facility]                      bring cpap mask and tubing only   Report to admitting at   0735 AM     Call this number if you have problems the morning of surgery [phone]    Remember: NO SOLID FOOD AFTER MIDNIGHT THE NIGHT PRIOR TO SURGERY. NOTHING BY MOUTH EXCEPT CLEAR LIQUIDS UNTIL     0700 am.   PLEASE FINISH ENSURE DRINK PER SURGEON ORDER  WHICH NEEDS TO BE COMPLETED AT     0700 am then nothing by mouth.     CLEAR LIQUID DIET   Foods Allowed                                                                     Foods Excluded water Black Coffee and tea, regular and decaf                             liquids that you cannot  Plain Jell-O any favor except red or purple                                           see through such as: Fruit ices (not with fruit pulp)                                                        milk, soups, orange juice  Iced Popsicles                                                       All solid food Carbonated beverages, regular and diet                                    Cranberry, grape and apple juices Sports drinks like Gatorade Lightly  seasoned clear broth or consume(fat free) Sugar, honey syrup _____________________________________________________________________     BRUSH YOUR TEETH MORNING OF SURGERY AND RINSE YOUR MOUTH OUT, NO CHEWING GUM CANDY OR MINTS.     Take these medicines the morning of surgery with A SIP OF WATER: metoprolol, Atorvastatin  DO NOT TAKE ANY DIABETIC MEDICATIONS DAY OF YOUR SURGERY  How to Manage Your Diabetes Before and After Surgery  Why is it important to control my blood sugar before and after surgery? Improving blood sugar levels before and after surgery helps healing and can limit problems. A way of improving blood sugar control is eating a healthy diet by:  Eating less sugar and carbohydrates  Increasing activity/exercise  Talking with your doctor about reaching your blood sugar goals High blood sugars (greater than 180 mg/dL) can raise your risk of infections and slow your recovery, so you will need to focus on controlling your diabetes during the weeks before surgery. Make sure that the doctor who takes care of your diabetes knows about your planned surgery including the date and location.  How do I manage my blood sugar before surgery? Check your blood sugar at least 4 times a day, starting 2 days before surgery, to make sure that the level is not too high or low. Check your blood sugar the morning of your surgery when you wake up and every 2 hours until you get to the Short Stay unit. If your blood sugar is less than 70 mg/dL, you will need to treat for low blood sugar: Do not take insulin. Treat a low blood sugar (less than 70 mg/dL) with  cup of clear juice (cranberry or apple), 4 glucose tablets, OR glucose gel. Recheck blood sugar in 15 minutes after treatment (to make sure it is greater than 70 mg/dL). If your blood sugar is not greater than 70 mg/dL on recheck, call [phone] for further instructions. Report your blood sugar to the short stay nurse when you get to Short Stay.  If you are admitted to the hospital after surgery: Your blood sugar will be checked by the staff and you will probably be  given insulin after surgery (instead of oral diabetes medicines) to make sure you have good blood sugar levels. The goal for blood sugar control after surgery is 80-180 mg/dL.   WHAT DO I DO ABOUT MY DIABETES MEDICATION?     Take only morning/ lunch dose of glimepiride day before surgery . Hold Jardiance day before surgery.    Do not take any diabetic medication the day of your surgery    The day of surgery, do not take other diabetes injectables, including Byetta (exenatide), Bydureon (exenatide ER), Victoza (liraglutide), or Trulicity (dulaglutide).               You may not have any metal on your body including hair pins and              piercings  Do not wear jewelry, lotions, powders or perfumes, deodorant                     Men may shave face and neck.   Do not bring valuables to the hospital. [facility] IS NOT             RESPONSIBLE   FOR VALUABLES.  Contacts, dentures or bridgework may not be worn into surgery.       Patients discharged the day of surgery will not be allowed to drive home.  IF YOU ARE HAVING SURGERY AND GOING HOME THE SAME DAY, YOU MUST HAVE AN ADULT TO DRIVE YOU HOME AND BE WITH YOU FOR 24 HOURS. YOU MAY GO HOME BY TAXI OR UBER OR ORTHERWISE, BUT AN ADULT MUST ACCOMPANY YOU HOME AND STAY WITH YOU FOR 24 HOURS.  Name and phone number of your driver:  Special Instructions: N/A              Please read over the following fact sheets you were given: _____________________________________________________________________             [facility] - Preparing for Surgery Before surgery, you can play an important role.  Because skin is not sterile, your skin needs to be as free of germs as possible.  You can reduce the number of germs on your skin by washing with CHG (chlorahexidine gluconate) soap before surgery.  CHG is an antiseptic cleaner which kills germs and bonds with the skin to continue killing germs even after washing. Please DO NOT use if you have an  allergy to CHG or antibacterial soaps.  If your skin becomes reddened/irritated stop using the CHG and inform your nurse when you arrive at Short Stay. Do not shave (including legs and underarms) for at least 48 hours prior to the first CHG shower.  You may shave your face/neck. Please follow these instructions carefully:  1.  Shower with CHG Soap the night before surgery and the  morning of Surgery.  2.  If you choose to wash your hair, wash your hair first as usual with your  normal  shampoo.  3.  After you shampoo, rinse your hair and body thoroughly to remove the  shampoo.                           4.  Use CHG as you would any other liquid soap.  You can apply chg directly  to the skin and wash                       Gently with a scrungie or clean washcloth.  5.  Apply the CHG Soap to your body ONLY FROM THE NECK DOWN.   Do not use on face/ open                           Wound or open sores. Avoid contact with eyes, ears mouth and genitals (private parts).                       Wash face,  Genitals (private parts) with your normal soap.             6.  Wash thoroughly, paying special attention to the area where your surgery  will be performed.  7.  Thoroughly rinse your body with warm water from the neck down.  8.  DO NOT shower/wash with your normal soap after using and rinsing off  the CHG Soap.                9.  Pat yourself dry with a clean towel.            10.  Wear clean pajamas.            11.  Place clean sheets on your bed the night of your first shower and do not  sleep with pets. Day of  Surgery : Do not apply any lotions/deodorants the morning of surgery.  Please wear clean clothes to the hospital/surgery center.  FAILURE TO FOLLOW THESE INSTRUCTIONS MAY RESULT IN THE CANCELLATION OF YOUR SURGERY PATIENT SIGNATURE_________________________________  NURSE SIGNATURE__________________________________  _  Incentive Spirometer  An incentive spirometer is a tool that can help  keep your lungs clear and active. This tool measures how well you are filling your lungs with each breath. Taking long deep breaths may help reverse or decrease the chance of developing breathing (pulmonary) problems (especially infection) following: A long period of time when you are unable to move or be active. BEFORE THE PROCEDURE  If the spirometer includes an indicator to show your best effort, your nurse or respiratory therapist will set it to a desired goal. If possible, sit up straight or lean slightly forward. Try not to slouch. Hold the incentive spirometer in an upright position. INSTRUCTIONS FOR USE  Sit on the edge of your bed if possible, or sit up as far as you can in bed or on a chair. Hold the incentive spirometer in an upright position. Breathe out normally. Place the mouthpiece in your mouth and seal your lips tightly around it. Breathe in slowly and as deeply as possible, raising the piston or the ball toward the top of the column. Hold your breath for 3-5 seconds or for as long as possible. Allow the piston or ball to fall to the bottom of the column. Remove the mouthpiece from your mouth and breathe out normally. Rest for a few seconds and repeat Steps 1 through 7 at least 10 times every 1-2 hours when you are awake. Take your time and take a few normal breaths between deep breaths. The spirometer may include an indicator to show your best effort. Use the indicator as a goal to work toward during each repetition. After each set of 10 deep breaths, practice coughing to be sure your lungs are clear. If you have an incision (the cut made at the time of surgery), support your incision when coughing by placing a pillow or rolled up towels firmly against it. Once you are able to get out of bed, walk around indoors and cough well. You may stop using the incentive spirometer when instructed by your caregiver.  RISKS AND COMPLICATIONS Take your time so you do not get dizzy or  light-headed. If you are in pain, you may need to take or ask for pain medication before doing incentive spirometry. It is harder to take a deep breath if you are having pain. AFTER USE Rest and breathe slowly and easily. It can be helpful to keep track of a log of your progress. Your caregiver can provide you with a simple table to help with this. If you are using the spirometer at home, follow these instructions: SEEK MEDICAL CARE IF:  You are having difficultly using the spirometer. You have trouble using the spirometer as often as instructed. Your pain medication is not giving enough relief while using the spirometer. You develop fever of 100.5 F (38.1 C) or higher. SEEK IMMEDIATE MEDICAL CARE IF:  You cough up bloody sputum that had not been present before. You develop fever of 102 F (38.9 C) or greater. You develop worsening pain at or near the incision site. MAKE SURE YOU:  Understand these instructions. Will watch your condition. Will get help right away if you are not doing well or get worse. Document Released: 02/14/2007 Document Revised: 12/27/2011 Document Reviewed: 04/17/2007 Exit[employer] Patient  Information 2014 [employer], [place].   ________________________________________________________________________ _______________________________________________________________________

## 2021-04-10 NOTE — Progress Notes (Addendum)
PCP - Lynnea Ferrier , MD Cardiologist - Epifanio Lesches clearance 04-23-21   PPM/ICD -  Device Orders -  Rep Notified -   Chest x-ray - 2019 EKG - 12-30-20 epic Stress Test - 12-09-20 ECHO - 12-09-20 Cardiac Cath -   Sleep Study -  CPAP -   Fasting Blood Sugar -  Checks Blood Sugar _____ times a day  Blood Thinner Instructions: Aspirin Instructions:81 mg continue perioperatively  ERAS Protcol - PRE-SURGERY Ensure or G2-   COVID TEST- 05-01-21  Activity-Walks to mailbox without SOB works in yard until gets SOB and then stops Anesthesia review: HTN,DMII,CAD, CHF  Patient denies shortness of breath, fever, cough and chest pain at PAT appointment   All instructions explained to the patient, with a verbal understanding of the material. Patient agrees to go over the instructions while at home for a better understanding. Patient also instructed to self quarantine after being tested for COVID-19. The opportunity to ask questions was provided.

## 2021-04-14 ENCOUNTER — Encounter (HOSPITAL_COMMUNITY): Payer: Self-pay

## 2021-04-14 ENCOUNTER — Encounter (HOSPITAL_COMMUNITY)
Admission: RE | Admit: 2021-04-14 | Discharge: 2021-04-14 | Disposition: A | Discharge status: Home / Self Care | Payer: Medicare Other | Source: Ambulatory Visit | Attending: Orthopedic Surgery | Admitting: Orthopedic Surgery

## 2021-04-14 ENCOUNTER — Other Ambulatory Visit: Payer: Self-pay

## 2021-04-14 DIAGNOSIS — Z01812 Encounter for preprocedural laboratory examination: Secondary | ICD-10-CM | POA: Diagnosis not present

## 2021-04-14 HISTORY — DX: Unspecified osteoarthritis, unspecified site: M19.90

## 2021-04-14 HISTORY — DX: Heart failure, unspecified: I50.9

## 2021-04-14 HISTORY — DX: Personal history of urinary calculi: Z87.442

## 2021-04-14 HISTORY — DX: Atherosclerotic heart disease of native coronary artery without angina pectoris: I25.10

## 2021-04-14 HISTORY — DX: Pneumonia, unspecified organism: J18.9

## 2021-04-14 HISTORY — DX: Sleep apnea, unspecified: G47.30

## 2021-04-14 NOTE — Progress Notes (Signed)
Preop done as phone call for lab appt. 04-24-21 at 11:00 am

## 2021-04-24 ENCOUNTER — Other Ambulatory Visit: Payer: Self-pay

## 2021-04-24 ENCOUNTER — Encounter (HOSPITAL_COMMUNITY)
Admission: RE | Admit: 2021-04-24 | Discharge: 2021-04-24 | Disposition: A | Discharge status: Home / Self Care | Payer: Medicare Other | Source: Ambulatory Visit | Attending: Orthopedic Surgery | Admitting: Orthopedic Surgery

## 2021-04-24 DIAGNOSIS — Z01812 Encounter for preprocedural laboratory examination: Secondary | ICD-10-CM | POA: Diagnosis not present

## 2021-04-24 LAB — COMPREHENSIVE METABOLIC PANEL
ALT: 34 U/L (ref 0–44)
AST: 43 U/L — ABNORMAL HIGH (ref 15–41)
Albumin: 3.9 g/dL (ref 3.5–5.0)
Alkaline Phosphatase: 63 U/L (ref 38–126)
Anion gap: 11 (ref 5–15)
BUN: 24 mg/dL — ABNORMAL HIGH (ref 8–23)
CO2: 21 mmol/L — ABNORMAL LOW (ref 22–32)
Calcium: 9.1 mg/dL (ref 8.9–10.3)
Chloride: 105 mmol/L (ref 98–111)
Creatinine, Ser: 1.42 mg/dL — ABNORMAL HIGH (ref 0.61–1.24)
GFR, Estimated: 53 mL/min — ABNORMAL LOW (ref >60)
Glucose, Bld: 216 mg/dL — ABNORMAL HIGH (ref 70–99)
Potassium: 4.9 mmol/L (ref 3.5–5.1)
Sodium: 137 mmol/L (ref 135–145)
Total Bilirubin: 1.3 mg/dL — ABNORMAL HIGH (ref 0.3–1.2)
Total Protein: 6.9 g/dL (ref 6.5–8.1)

## 2021-04-24 LAB — TYPE AND SCREEN
ABO/RH(D): O NEG
Antibody Screen: NEGATIVE

## 2021-04-24 LAB — CBC
HCT: 45.5 % (ref 39.0–52.0)
Hemoglobin: 14.8 g/dL (ref 13.0–17.0)
MCH: 29.7 pg (ref 26.0–34.0)
MCHC: 32.5 g/dL (ref 30.0–36.0)
MCV: 91.4 fL (ref 80.0–100.0)
Platelets: 201 10*3/uL (ref 150–400)
RBC: 4.98 MIL/uL (ref 4.22–5.81)
RDW: 14.2 % (ref 11.5–15.5)
WBC: 8.1 10*3/uL (ref 4.0–10.5)
nRBC: 0 % (ref 0.0–0.2)

## 2021-04-24 LAB — HEMOGLOBIN A1C
Hgb A1c MFr Bld: 7.6 % — ABNORMAL HIGH (ref 4.8–5.6)
Mean Plasma Glucose: 171.42 mg/dL

## 2021-04-24 LAB — PROTIME-INR
INR: 1 (ref 0.8–1.2)
Prothrombin Time: 12.8 seconds (ref 11.4–15.2)

## 2021-04-24 LAB — SURGICAL PCR SCREEN
MRSA, PCR: NEGATIVE
Staphylococcus aureus: NEGATIVE

## 2021-04-24 LAB — APTT: aPTT: 28 seconds (ref 24–36)

## 2021-04-24 NOTE — Progress Notes (Signed)
A1c results sent to Dr. Charlann Boxer to review.

## 2021-05-01 ENCOUNTER — Other Ambulatory Visit (HOSPITAL_COMMUNITY): Payer: Medicare Other

## 2021-05-05 ENCOUNTER — Encounter (HOSPITAL_COMMUNITY): Admission: RE | Payer: Self-pay | Source: Ambulatory Visit

## 2021-05-05 ENCOUNTER — Ambulatory Visit (HOSPITAL_COMMUNITY): Admission: RE | Admit: 2021-05-05 | Payer: Medicare Other | Source: Ambulatory Visit | Admitting: Orthopedic Surgery

## 2021-05-05 SURGERY — ARTHROPLASTY, KNEE, TOTAL
Anesthesia: Spinal | Site: Knee | Laterality: Left

## 2021-05-19 ENCOUNTER — Ambulatory Visit (HOSPITAL_COMMUNITY): Payer: Medicare Other | Attending: Cardiology

## 2021-05-19 ENCOUNTER — Other Ambulatory Visit: Payer: Self-pay

## 2021-05-19 DIAGNOSIS — I5042 Chronic combined systolic (congestive) and diastolic (congestive) heart failure: Secondary | ICD-10-CM | POA: Diagnosis not present

## 2021-05-19 LAB — ECHOCARDIOGRAM COMPLETE
Area-P 1/2: 2.26 cm2
P 1/2 time: 521 msec
S' Lateral: 4.15 cm

## 2021-05-19 MED ORDER — PERFLUTREN LIPID MICROSPHERE
2.0000 mL | INTRAVENOUS | Status: AC | PRN
  Administered 2021-05-19: 2 mL via INTRAVENOUS

## 2021-05-20 ENCOUNTER — Ambulatory Visit: Payer: Medicare Other | Admitting: Cardiology

## 2021-05-21 ENCOUNTER — Telehealth: Payer: Self-pay | Admitting: Cardiology

## 2021-05-21 DIAGNOSIS — R9430 Abnormal result of cardiovascular function study, unspecified: Secondary | ICD-10-CM

## 2021-05-21 NOTE — Telephone Encounter (Signed)
  Follow up:      Patient returning a call for results. 

## 2021-05-21 NOTE — Telephone Encounter (Signed)
-----   Message from Little Ishikawa, MD sent at 05/21/2021  6:54 AM EDT ----- Heart pumping function remains reduced.  Recommend referral to EP for BiV ICD evaluation

## 2021-05-21 NOTE — Telephone Encounter (Signed)
Advised patient, verbalized understanding  Referral placed and message sent to [facility]

## 2021-06-01 NOTE — Progress Notes (Signed)
Cardiology Office Note:    Date:  06/02/2021   ID:  Keith Schwartz, DOB Mar 24, 1951, MRN [id number]  PCP:  Donita Brooks, MD  Cardiologist:  Little Ishikawa, MD  Electrophysiologist:  None   Referring MD: Donita Brooks, MD   Chief Complaint  Patient presents with   Follow-up    3 months.   Chest Pain   Congestive Heart Failure     History of Present Illness:    Keith Schwartz is a 71 y.o. male with a hx of CAD, chronic combined systolic and also heart failure, T2DM, hypertension who presents for follow-up.  He was referred by Dr. Tanya Nones for evaluation of syncope, initially seen on 11/12/2020.  He had an episode of syncope on 10/16/2020.  Reports he had bronchitis at the time and had taken DayQuil that morning, in addition to taking lisinopril.  He was at [facility] and had checked his blood pressure and noted it was low, 90s over 60s.  Subsequently was standing in the checkout line and suddenly passed out.  Denies any prodromal symptoms.  States that he fell to the ground, did not hit his head.  Thinks he was unconscious only for a few seconds.  EMS was called and reportedly BP was down to 70s.  He received IV fluids and BP up to 90s on arrival to ED.  In ED, EKG showed left bundle branch block.  Labs notable for creatinine 1.75.  He has not taken lisinopril since discharge from ED.  He denies any lightheadedness or syncope since this episode, or prior to that.  Denies any palpitations.  Does report he has been having chest pain.  Describes as pressure in center of upper chest that occurs with exertion.  States that he will continue to exert himself and it will subside, typically last for 1 to 2 minutes.  No smoking history.  Family history includes father had MI and died of CHF in 32s.  Echocardiogram on 12/09/2020 showed EF 30%, normal RV function.  Lexiscan Myoview on 12/09/2020 showed large severe fixed defect involving inferior and anterior walls with preservation of  lateral wall and LVEF 24%, suggesting extensive infarct.  Cardiac catheterization on 12/22/2020 showed diffusely diseased LAD with positive RFR, nonobstructive LCx stenosis, mild diffuse RCA disease, normal right heart pressures.  Medical therapy recommended.  Zio patch x14 days on 12/15/2020 showed 1 episode of NSVT lasting 4 beats, 3 episodes of SVT longest lasting 10 beats.  Echocardiogram 05/19/21 shows LVEF 30 to 35%, dyssynchrony consistent with LBBB, normal RV function, no significant valvular disease.   Since last clinic visit, he reports that he has been doing okay.  Reports occasional chest pain when walking.  Also with exertional shortness of breath.  Has not had to use nitroglycerin.  Denies any lightheadedness, syncope, palpitations, lower extremity edema.  Reports BP 100s-130s over 60s to 70s at home.  Wt Readings from Last 3 Encounters:  06/02/21 246 lb (111.6 kg)  04/24/21 242 lb (109.8 kg)  02/16/21 254 lb (115.2 kg)     Past Medical History:  Diagnosis Date   Arthritis    CHF (congestive heart failure) (HCC)    Coronary artery disease    Diabetes mellitus without complication (HCC) 10/18/2000   History of kidney stones    Hypertension 10/18/2000   Pneumonia    walking   Sleep apnea    uses cpap at times    Past Surgical History:  Procedure Laterality Date   APPENDECTOMY  10/18/1968   HERNIA REPAIR     INTRAVASCULAR PRESSURE WIRE/FFR STUDY N/A 12/22/2020   Procedure: INTRAVASCULAR PRESSURE WIRE/FFR STUDY;  Surgeon: Tonny Bollman, MD;  Location: [facility] INVASIVE CV LAB;  Service: Cardiovascular;  Laterality: N/A;   JOINT REPLACEMENT  10/18/2010   rt knee   RIGHT/LEFT HEART CATH AND CORONARY ANGIOGRAPHY N/A 12/22/2020   Procedure: RIGHT/LEFT HEART CATH AND CORONARY ANGIOGRAPHY;  Surgeon: Tonny Bollman, MD;  Location: [facility] INVASIVE CV LAB;  Service: Cardiovascular;  Laterality: N/A;   VASECTOMY  10/18/1984    Current Medications: Current Meds  Medication Sig   Ascorbic  Acid (VITAMIN C) 1000 MG tablet Take 1,000 mg by mouth daily.   aspirin 81 MG EC tablet Take 81 mg by mouth daily.   clotrimazole-betamethasone (LOTRISONE) cream Apply 1 application topically 2 (two) times daily.   glimepiride (AMARYL) 4 MG tablet Take 4 mg by mouth 2 (two) times daily.   Glucose Blood (BLOOD GLUCOSE TEST STRIPS) STRP Please dispense based on patient and insurance preference. Use as directed to monitor FSBS 3x daily. Dx: H4174.   hydrOXYzine (ATARAX/VISTARIL) 25 MG tablet Take 1 tablet (25 mg total) by mouth 3 (three) times daily as needed. (Patient taking differently: Take 25 mg by mouth at bedtime.)   JARDIANCE 10 MG TABS tablet Take 10 mg by mouth daily.   magnesium oxide (MAG-OX) 400 MG tablet Take 400 mg by mouth daily.   melatonin 5 MG TABS Take 10 mg by mouth at bedtime as needed (sleep).   metFORMIN (GLUCOPHAGE) 1000 MG tablet TAKE 1 TABLET TWICE A DAY WITH MEALS (Patient taking differently: Take 1,000 mg by mouth 2 (two) times daily with a meal.)   metoprolol succinate (TOPROL XL) 25 MG 24 hr tablet Take 1 tablet (25 mg total) by mouth daily.   Multiple Vitamin (MULTIVITAMIN) tablet Take 1 tablet by mouth daily. MEN'S ONE A DAY 50+   pramipexole (MIRAPEX) 1 MG tablet Take 1 mg by mouth at bedtime.   sacubitril-valsartan (ENTRESTO) 24-26 MG Take 1 tablet by mouth 2 (two) times daily.   TRULICITY 1.5 MG/0.5ML SOPN INJECT 1 PEN UNDER THE SKIN ONCE A WEEK (Patient taking differently: Inject 1.5 mg as directed every Thursday.)   Vitamin D3 (VITAMIN D) 25 MCG tablet Take 1,000 Units by mouth daily.   vitamin E 400 UNIT capsule Take 400 Units by mouth daily.   zinc gluconate 50 MG tablet Take 50 mg by mouth daily.     Allergies:   Glipizide and Penicillins   Social History   Socioeconomic History   Marital status: Married    Spouse name: Not on file   Number of children: Not on file   Years of education: Not on file   Highest education level: Not on file  Occupational  History   Not on file  Tobacco Use   Smoking status: Never   Smokeless tobacco: Never  Vaping Use   Vaping Use: Never used  Substance and Sexual Activity   Alcohol use: Not Currently    Alcohol/week: 1.0 standard drink    Types: 1 Cans of beer per week   Drug use: Never   Sexual activity: Not Currently    Birth control/protection: Surgical  Other Topics Concern   Not on file  Social History Narrative   Not on file   Social Determinants of Health   Financial Resource Strain: Not on file  Food Insecurity: Not on file  Transportation Needs: Not on file  Physical Activity: Not  on file  Stress: Not on file  Social Connections: Not on file     Family History: The patient's family history includes Arthritis in his mother; Asthma in his mother; Early death in his sister; Hearing loss in his mother; Heart disease in his father; Hyperlipidemia in his father; Hypertension in his father; Vision loss in his mother.  ROS:   Please see the history of present illness. (+) Chest pain (+) Shortness of breath All other systems reviewed and are negative.  EKGs/Labs/Other Studies Reviewed:    The following studies were reviewed today:  EKG:   12/30/2020: normal sinus rhythm, rate 80, left bundle branch block 02/16/2021: EKG is not ordered today. 06/02/21: NSR, rate 73, LBBB, QRS 176ms   Recent Labs: 02/16/2021: Magnesium 1.9 04/24/2021: ALT 34; BUN 24; Creatinine, Ser 1.42; Hemoglobin 14.8; Platelets 201; Potassium 4.9; Sodium 137  Recent Lipid Panel    Component Value Date/Time   CHOL 185 11/12/2020 1528   TRIG 254 (H) 11/12/2020 1528   HDL 32 (L) 11/12/2020 1528   CHOLHDL 5.8 (H) 11/12/2020 1528   LDLCALC 109 (H) 11/12/2020 1528    Physical Exam:    VS:  BP (!) 108/54 (BP Location: Right Arm, Patient Position: Sitting, Cuff Size: Normal)   Pulse 73   Ht 6\' 4"  (1.93 m)   Wt 246 lb (111.6 kg)   BMI 29.94 kg/m     Wt Readings from Last 3 Encounters:  06/02/21 246 lb (111.6  kg)  04/24/21 242 lb (109.8 kg)  02/16/21 254 lb (115.2 kg)     GEN: Well nourished, well developed in no acute distress HEENT: Normal NECK: No JVD; No carotid bruits CARDIAC:RRR, 2 out of 6 systolic murmur RESPIRATORY:  Clear to auscultation without rales, wheezing or rhonchi  ABDOMEN: Soft, non-tender, non-distended MUSCULOSKELETAL:  No edema; No deformity  SKIN: Warm and dry NEUROLOGIC:  Alert and oriented x 3 PSYCHIATRIC:  Normal affect   ASSESSMENT:    1. CAD in native artery   2. Chronic combined systolic and diastolic heart failure (HCC)   3. Hyperlipidemia, unspecified hyperlipidemia type   4. Essential hypertension     PLAN:    CAD: Reported chest pain concerning for angina and EKG with left bundle branch block.  Echocardiogram on 12/09/2020 showed EF 30%, normal RV function.  Lexiscan Myoview on 12/09/2020 showed large severe fixed defect involving inferior and anterior walls with preservation of lateral wall and LVEF 24%, suggesting extensive infarct.  Cardiac catheterization on 12/22/2020 showed diffusely diseased LAD with positive RFR, nonobstructive LCx stenosis, mild diffuse RCA disease, normal right heart pressures.  Medical management recommended for obstructive LAD disease.  Reports occasional exertional chest pain, has not had to use nitroglycerin -Continue aspirin 81 mg daily -Continue atorvastatin 80 mg daily.  Check lipid panel -Continue Toprol-XL 25 mg daily -As needed sublingual nitroglycerin.  Chronic combined systolic and diastolic heart failure: EF 30% on echocardiogram 12/09/2020.  Diffuse severe LAD disease as above, medical management recommended.  Echocardiogram 05/19/21 shows LVEF 30 to 35%, dyssynchrony consistent with LBBB -Continue Entresto 24-26 mg twice daily.  Developed hypotension/hyperkalemia on 49-51 mg BID -Continue Toprol-XL 25 mg daily -Continue Jardiance 10 mg daily -Will check BMP.  If stable kidney function/potassium and remind BP, plan to  add spironolactone 12.5 mg daily -Cardiac MRI -EF remains <35% despite GDMT, and considering his LBBB with QRS 176ms, will refer to EP for evaluation for CRT-D  Syncope: Suspect due to hypotension, which may have been due  to dehydration in setting of URI and continuing to take lisinopril.  SBP in 70s on EMS arrival.  No prodromal symptoms, arrhythmia remains on differential.  Echocardiogram on 12/09/2020 showed EF 30%, normal RV function.   Zio patch x14 days on 12/15/2020 showed 1 episode of NSVT lasting 4 beats, 3 episodes of SVT longest lasting 10 beats.  Hypertension: Continue Entresto and Toprol-XL as above  Hyperlipidemia: On atorvastatin 40 mg daily.  LDL 109 on 11/12/2020.  Increased atorvastatin to 80 mg.  Will check lipid panel  T2DM: On metformin, glimepiride, Trulicity.  A1c 7.6% 04/24/21  AKI: Creatinine 1.75 on 10/16/2020, improved to creatinine 1.27 on 3/3. Cr 1.4 on 04/24/21  Leg pain: ABIs indicate noncompressible vessels, normal TBI's on 01/08/2021.  Lower extremity duplex shows heavy calcifications, questionable distal occlusion in the left posterior tibial artery.   RTC in 2 months     Medication Adjustments/Labs and Tests Ordered: Current medicines are reviewed at length with the patient today.  Concerns regarding medicines are outlined above.  Orders Placed This Encounter  Procedures   MR CARDIAC MORPHOLOGY W WO CONTRAST   Basic metabolic panel   CBC   Magnesium   Lipid panel   EKG 12-Lead    No orders of the defined types were placed in this encounter.   Patient Instructions  Medication Instructions:  Your physician recommends that you continue on your current medications as directed. Please refer to the Current Medication list given to you today.  *If you need a refill on your cardiac medications before your next appointment, please call your pharmacy*   Lab Work: BMET, CBC, Lipid, Mag today  If you have labs (blood work) drawn today and your tests are  completely normal, you will receive your results only by: MyChart Message (if you have MyChart) OR A paper copy in the mail If you have any lab test that is abnormal or we need to change your treatment, we will call you to review the results.   Testing/Procedures: Your physician has requested that you have a cardiac MRI. Cardiac MRI uses a computer to create images of your heart as its beating, producing both still and moving pictures of your heart and major blood vessels. For further information please visit [email or web address]. Please follow the instruction sheet given to you today for more information.  Follow-Up: At [facility], you and your health needs are our priority.  As part of our continuing mission to provide you with exceptional heart care, we have created designated Provider Care Teams.  These Care Teams include your primary Cardiologist (physician) and Advanced Practice Providers (APPs -  Physician Assistants and Nurse Practitioners) who all work together to provide you with the care you need, when you need it.  We recommend signing up for the patient portal called "MyChart".  Sign up information is provided on this After Visit Summary.  MyChart is used to connect with patients for Virtual Visits (Telemedicine).  Patients are able to view lab/test results, encounter notes, upcoming appointments, etc.  Non-urgent messages can be sent to your provider as well.   To learn more about what you can do with MyChart, go to [email or web address].    Your next appointment:   2 month(s)  The format for your next appointment:   In Person  Provider:   Epifanio Lesches, MD      Signed, Little Ishikawa, MD  06/02/2021 12:58 PM    [facility]

## 2021-06-02 ENCOUNTER — Other Ambulatory Visit: Payer: Self-pay

## 2021-06-02 ENCOUNTER — Ambulatory Visit (INDEPENDENT_AMBULATORY_CARE_PROVIDER_SITE_OTHER): Payer: Medicare Other | Admitting: Cardiology

## 2021-06-02 ENCOUNTER — Encounter: Payer: Self-pay | Admitting: Cardiology

## 2021-06-02 VITALS — BP 108/54 | HR 73 | Ht 76.0 in | Wt 246.0 lb

## 2021-06-02 DIAGNOSIS — E785 Hyperlipidemia, unspecified: Secondary | ICD-10-CM

## 2021-06-02 DIAGNOSIS — I10 Essential (primary) hypertension: Secondary | ICD-10-CM | POA: Diagnosis not present

## 2021-06-02 DIAGNOSIS — I2510 Atherosclerotic heart disease of native coronary artery without angina pectoris: Secondary | ICD-10-CM

## 2021-06-02 DIAGNOSIS — I5042 Chronic combined systolic (congestive) and diastolic (congestive) heart failure: Secondary | ICD-10-CM | POA: Diagnosis not present

## 2021-06-02 NOTE — Patient Instructions (Addendum)
Medication Instructions:  Your physician recommends that you continue on your current medications as directed. Please refer to the Current Medication list given to you today.  *If you need a refill on your cardiac medications before your next appointment, please call your pharmacy*   Lab Work: BMET, CBC, Lipid, Mag today  If you have labs (blood work) drawn today and your tests are completely normal, you will receive your results only by: MyChart Message (if you have MyChart) OR A paper copy in the mail If you have any lab test that is abnormal or we need to change your treatment, we will call you to review the results.   Testing/Procedures: Your physician has requested that you have a cardiac MRI. Cardiac MRI uses a computer to create images of your heart as its beating, producing both still and moving pictures of your heart and major blood vessels. For further information please visit [email or web address]. Please follow the instruction sheet given to you today for more information.  Follow-Up: At [facility], you and your health needs are our priority.  As part of our continuing mission to provide you with exceptional heart care, we have created designated Provider Care Teams.  These Care Teams include your primary Cardiologist (physician) and Advanced Practice Providers (APPs -  Physician Assistants and Nurse Practitioners) who all work together to provide you with the care you need, when you need it.  We recommend signing up for the patient portal called "MyChart".  Sign up information is provided on this After Visit Summary.  MyChart is used to connect with patients for Virtual Visits (Telemedicine).  Patients are able to view lab/test results, encounter notes, upcoming appointments, etc.  Non-urgent messages can be sent to your provider as well.   To learn more about what you can do with MyChart, go to [email or web address].    Your next appointment:   2 month(s)  The format for  your next appointment:   In Person  Provider:   Epifanio Lesches, MD

## 2021-06-03 LAB — CBC
Hematocrit: 42.2 % (ref 37.5–51.0)
Hemoglobin: 14.2 g/dL (ref 13.0–17.7)
MCH: 30 pg (ref 26.6–33.0)
MCHC: 33.6 g/dL (ref 31.5–35.7)
MCV: 89 fL (ref 79–97)
Platelets: 190 10*3/uL (ref 150–450)
RBC: 4.73 x10E6/uL (ref 4.14–5.80)
RDW: 12.8 % (ref 11.6–15.4)
WBC: 7.1 10*3/uL (ref 3.4–10.8)

## 2021-06-03 LAB — BASIC METABOLIC PANEL
BUN/Creatinine Ratio: 27 — ABNORMAL HIGH (ref 10–24)
BUN: 28 mg/dL — ABNORMAL HIGH (ref 8–27)
CO2: 20 mmol/L (ref 20–29)
Calcium: 9.1 mg/dL (ref 8.6–10.2)
Chloride: 104 mmol/L (ref 96–106)
Creatinine, Ser: 1.04 mg/dL (ref 0.76–1.27)
Glucose: 100 mg/dL — ABNORMAL HIGH (ref 65–99)
Potassium: 5.1 mmol/L (ref 3.5–5.2)
Sodium: 140 mmol/L (ref 134–144)
eGFR: 77 mL/min/{1.73_m2} (ref >59)

## 2021-06-03 LAB — LIPID PANEL
Chol/HDL Ratio: 3.6 ratio (ref 0.0–5.0)
Cholesterol, Total: 109 mg/dL (ref 100–199)
HDL: 30 mg/dL — ABNORMAL LOW (ref >39)
LDL Chol Calc (NIH): 47 mg/dL (ref 0–99)
Triglycerides: 194 mg/dL — ABNORMAL HIGH (ref 0–149)
VLDL Cholesterol Cal: 32 mg/dL (ref 5–40)

## 2021-06-03 LAB — MAGNESIUM: Magnesium: 2 mg/dL (ref 1.6–2.3)

## 2021-06-12 ENCOUNTER — Telehealth: Payer: Self-pay | Admitting: Cardiology

## 2021-06-12 NOTE — Telephone Encounter (Incomplete)
Pt c/o medication issue:  1. Name of Medication:metoprolol succinate (TOPROL XL) 25 MG 24 hr tablet  2. How are you currently taking this medication (dosage and times per day)? As prescribed  3. Are you having a reaction (difficulty breathing--STAT)? NO  4. What is your medication issue? Pt's wife is calling his medicaation dosage was increased but it was not sent to the pharmacy.pt wants when will it be sent

## 2021-06-19 MED ORDER — METOPROLOL SUCCINATE ER 50 MG PO TB24: 50.0000 mg | ORAL_TABLET | Freq: Every day | ORAL | 3 refills | Status: DC

## 2021-06-19 NOTE — Telephone Encounter (Signed)
Keith Schwartz is calling back in regards to this due to never hearing back in regards to it. States a VM can be left if she is unable to answer.

## 2021-06-19 NOTE — Telephone Encounter (Signed)
Returned call to patients wife (okay per [facility]) who had received patient's recent lab results and recommendations from Dr. Bjorn Pippin on MyChart:  Little Ishikawa, MD  06/03/2021  7:08 AM EDT     Normal kidney function but potassium remains borderline high, will hold off on adding spironolactone at this time.  Recommend increasing Toprol-XL to 50 mg daily   Patients wife would like to know if Toprol XL- 50mg  could be sent to in [place]. Advised patients wife that this has been sent in and to call back with any issues, questions, or concerns. Patients wife verbalized understanding.

## 2021-06-23 ENCOUNTER — Other Ambulatory Visit: Payer: Self-pay | Admitting: *Deleted

## 2021-07-02 ENCOUNTER — Institutional Professional Consult (permissible substitution) (HOSPITAL_BASED_OUTPATIENT_CLINIC_OR_DEPARTMENT_OTHER): Payer: Medicare Other | Admitting: Internal Medicine

## 2021-07-06 ENCOUNTER — Encounter: Payer: Self-pay | Admitting: Internal Medicine

## 2021-07-06 ENCOUNTER — Other Ambulatory Visit: Payer: Self-pay

## 2021-07-06 ENCOUNTER — Ambulatory Visit (INDEPENDENT_AMBULATORY_CARE_PROVIDER_SITE_OTHER): Payer: Medicare Other | Admitting: Internal Medicine

## 2021-07-06 VITALS — BP 110/64 | HR 68 | Ht 75.0 in | Wt 241.2 lb

## 2021-07-06 DIAGNOSIS — I5022 Chronic systolic (congestive) heart failure: Secondary | ICD-10-CM

## 2021-07-06 DIAGNOSIS — I2510 Atherosclerotic heart disease of native coronary artery without angina pectoris: Secondary | ICD-10-CM

## 2021-07-06 DIAGNOSIS — R9430 Abnormal result of cardiovascular function study, unspecified: Secondary | ICD-10-CM | POA: Diagnosis not present

## 2021-07-06 NOTE — Progress Notes (Signed)
Electrophysiology Office Note   Date:  07/06/2021   ID:  Keith Schwartz, DOB 06/04/1951, MRN [id number]  PCP:  Keith Brooks, MD  Cardiologist:  Dr Keith Schwartz Primary Electrophysiologist: Keith Range, MD    CC: CHF   History of Present Illness: Keith Schwartz is a 69 y.o. male who presents today for electrophysiology evaluation.   He is referred by Dr Keith Schwartz for further risk stratification of sudden death. He has a h/o HTN, DM, and CAD.  He presented to Dr Keith Schwartz 11/12/20 complaining of having a syncopal event 10/16/20.  He reports that ini the setting of bronchitis and taking DayQuil that he had low BP and passed out in the line at [facility].  SBP was 70s on follow-up with EMS. Echo 12/09/20 showed EF 30%.  Cath 12/22/20 showed nonobstructive CAD.  He did have diffuse LAD disease for which medical therapy was advised.  He worse a zio monitor 2/22 which revealed only a 4 beat episode of NSVT as well as short SVT (longest 10 beats).  Today, he denies symptoms of palpitations, chest pain, shortness of breath, orthopnea, PND, lower extremity edema, claudication, dizziness, presyncope, syncope, bleeding, or neurologic sequela. The patient is tolerating medications without difficulties and is otherwise without complaint today.    Past Medical History:  Diagnosis Date   Arthritis    CHF (congestive heart failure) (HCC)    Coronary artery disease    Diabetes mellitus without complication (HCC) 10/18/2000   History of kidney stones    Hypertension 10/18/2000   Pneumonia    walking   Sleep apnea    uses cpap at times   Past Surgical History:  Procedure Laterality Date   APPENDECTOMY  10/18/1968   HERNIA REPAIR     INTRAVASCULAR PRESSURE WIRE/FFR STUDY N/A 12/22/2020   Procedure: INTRAVASCULAR PRESSURE WIRE/FFR STUDY;  Surgeon: Keith Bollman, MD;  Location: [facility] INVASIVE CV LAB;  Service: Cardiovascular;  Laterality: N/A;   JOINT REPLACEMENT  10/18/2010   rt knee    RIGHT/LEFT HEART CATH AND CORONARY ANGIOGRAPHY N/A 12/22/2020   Procedure: RIGHT/LEFT HEART CATH AND CORONARY ANGIOGRAPHY;  Surgeon: Keith Bollman, MD;  Location: [facility] INVASIVE CV LAB;  Service: Cardiovascular;  Laterality: N/A;   VASECTOMY  10/18/1984     Current Outpatient Medications  Medication Sig Dispense Refill   Ascorbic Acid (VITAMIN C) 1000 MG tablet Take 1,000 mg by mouth daily.     aspirin 81 MG EC tablet Take 81 mg by mouth daily.     clotrimazole-betamethasone (LOTRISONE) cream Apply 1 application topically 2 (two) times daily. 30 g 2   glimepiride (AMARYL) 4 MG tablet Take 4 mg by mouth 2 (two) times daily.     Glucose Blood (BLOOD GLUCOSE TEST STRIPS) STRP Please dispense based on patient and insurance preference. Use as directed to monitor FSBS 3x daily. Dx: U5427. 200 each 1   hydrOXYzine (ATARAX/VISTARIL) 25 MG tablet Take 1 tablet (25 mg total) by mouth 3 (three) times daily as needed. (Patient taking differently: Take 25 mg by mouth at bedtime.) 90 tablet 1   JARDIANCE 10 MG TABS tablet Take 10 mg by mouth daily.     magnesium oxide (MAG-OX) 400 MG tablet Take 400 mg by mouth daily.     melatonin 5 MG TABS Take 10 mg by mouth at bedtime as needed (sleep).     metFORMIN (GLUCOPHAGE) 1000 MG tablet TAKE 1 TABLET TWICE A DAY WITH MEALS (Patient taking differently: Take 1,000 mg by mouth  2 (two) times daily with a meal.) 60 tablet 0   metoprolol succinate (TOPROL XL) 50 MG 24 hr tablet Take 1 tablet (50 mg total) by mouth daily. 90 tablet 3   Multiple Vitamin (MULTIVITAMIN) tablet Take 1 tablet by mouth daily. MEN'S ONE A DAY 50+     pramipexole (MIRAPEX) 1 MG tablet Take 1 mg by mouth at bedtime.     sacubitril-valsartan (ENTRESTO) 24-26 MG Take 1 tablet by mouth 2 (two) times daily. 60 tablet 3   TRULICITY 1.5 MG/0.5ML SOPN INJECT 1 PEN UNDER THE SKIN ONCE A WEEK (Patient taking differently: Inject 1.5 mg as directed every Thursday.) 6 mL 3   Vitamin D3 (VITAMIN D) 25 MCG  tablet Take 1,000 Units by mouth daily.     vitamin E 400 UNIT capsule Take 400 Units by mouth daily.     zinc gluconate 50 MG tablet Take 50 mg by mouth daily.     atorvastatin (LIPITOR) 80 MG tablet Take 1 tablet (80 mg total) by mouth daily. 90 tablet 3   nitroGLYCERIN (NITROSTAT) 0.4 MG SL tablet Place 1 tablet (0.4 mg total) under the tongue every 5 (five) minutes as needed for chest pain. 25 tablet 3   No current facility-administered medications for this visit.    Allergies:   Glipizide and Penicillins   Social History:  The patient  reports that he has never smoked. He has never used smokeless tobacco. He reports that he does not currently use alcohol after a past usage of about 1.0 standard drink per week. He reports that he does not use drugs.   Family History:  The patient's family history includes Arthritis in his mother; Asthma in his mother; Early death in his sister; Hearing loss in his mother; Heart disease in his father; Hyperlipidemia in his father; Hypertension in his father; Vision loss in his mother.    ROS:  Please see the history of present illness.   All other systems are personally reviewed and negative.    PHYSICAL EXAM: VS:  BP 110/64   Pulse 68   Ht 6\' 3"  (1.905 m)   Wt 241 lb 3.2 oz (109.4 kg)   SpO2 95%   BMI 30.15 kg/m  , BMI Body mass index is 30.15 kg/m. GEN: Well nourished, well developed, in no acute distress HEENT: normal Neck: no JVD, carotid bruits, or masses Cardiac: RRR; no murmurs, rubs, or gallops,no edema  Respiratory:  clear to auscultation bilaterally, normal work of breathing GI: soft, nontender, nondistended, + BS MS: no deformity or atrophy Skin: warm and dry  Neuro:  Strength and sensation are intact Psych: euthymic mood, full affect  EKG:  EKG is ordered today. The ekg ordered today is personally reviewed and shows sinus rhythm 68 bpm, PR 168 msec, LBBB (QRS 166 msec)   Recent Labs: 04/24/2021: ALT 34 06/02/2021: BUN 28;  Creatinine, Ser 1.04; Hemoglobin 14.2; Magnesium 2.0; Platelets 190; Potassium 5.1; Sodium 140  personally reviewed   Lipid Panel     Component Value Date/Time   CHOL 109 06/02/2021 1205   TRIG 194 (H) 06/02/2021 1205   HDL 30 (L) 06/02/2021 1205   CHOLHDL 3.6 06/02/2021 1205   LDLCALC 47 06/02/2021 1205   personally reviewed   Wt Readings from Last 3 Encounters:  07/06/21 241 lb 3.2 oz (109.4 kg)  06/02/21 246 lb (111.6 kg)  04/24/21 242 lb (109.8 kg)      Other studies personally reviewed: Additional studies/ records that were reviewed  today include: prior echo, cath, office notes,   Review of the above records today demonstrates: as above   ASSESSMENT AND PLAN:  1.  Ischemic CM/ CAD/ LBBB The patient has an ischemic CM (EF 30%), NYHA Class III CHF, and CAD.  He is referred by Dr Keith Schwartz for risk stratification of sudden death and consideration of ICD implantation.  At this time, he meets MADIT II/ SCD-HeFT criteria for ICD implantation for primary prevention of sudden death.  He has QRS > 150 msec with LBBB and therefore also meets criteria for CRT.  I have had a thorough discussion with the patient reviewing options.  The patient has had opportunities to ask questions and have them answered. The patient and I have decided together through a shared decision making process to proceed with BiVICD at this time.   Risks, benefits, alternatives to BiV ICD implantation were discussed in detail with the patient today. The patient understands that the risks include but are not limited to bleeding, infection, pneumothorax, perforation, tamponade, vascular damage, renal failure, MI, stroke, death, inappropriate shocks, and lead dislodgement and wishes to proceed.    He reports that it is currently hunting season.  He does not wish to proceed with ICD during hunting season but would prefer to have this done "after the first of the year".  I will therefore schedule with Dr Ladona Ridgel.  We will  arrange for an office visit with Dr Ladona Ridgel in December for further conversations.       Randolm Idol, MD  07/06/2021 12:10 PM     [facility] [address] [place] [address] [phone] (office) [phone] (fax)

## 2021-07-06 NOTE — Patient Instructions (Addendum)
Medication Instructions:  Your physician recommends that you continue on your current medications as directed. Please refer to the Current Medication list given to you today.  Labwork: None ordered.  Testing/Procedures: None ordered.  Follow-Up: Your physician wants you to follow-up in: with Dr. Ladona Ridgel in December.    Any Other Special Instructions Will Be Listed Below (If Applicable).  If you need a refill on your cardiac medications before your next appointment, please call your pharmacy.   Cardioverter Defibrillator Implantation An implantable cardioverter defibrillator (ICD) is a device that identifies and corrects abnormal heart rhythms. Cardioverter defibrillator implantation is a surgery to place an ICD under the skin in the chest or abdomen. An ICD has a battery, a small computer (pulse generator), and wires (leads) that go into the heart. The ICD detects and corrects two types of dangerous irregular heart rhythms (arrhythmias): A rapid heart rhythm in the lower chambers of the heart (ventricles). This is called ventricular tachycardia. The ventricles contracting in an uncoordinated way. This is called ventricular fibrillation. There are different types of ICDs, and the electrical signals from the ICD can be programmed differently based on the condition being treated. The electrical signals from the ICD can be low-energy pulses, high-energy shocks, or a combination of the two. The low-energy pulses are generally used to restore the heartbeat to normal when it is either too slow (bradycardia) or too fast. These pulses are painless. The high-energy shocks are used to treat abnormal rhythms such as ventricular tachycardia or ventricular fibrillation. This shock may feel like a strong jolt in the chest. Your health care provider may recommend an ICD if you have: Had a ventricular arrhythmia in the past. A damaged heart because of a disease or heart condition. A weakened heart muscle  from a heart attack or cardiac arrest. A congenital heart defect. Long QT syndrome, which is a disorder of the heart's electrical system. Brugada syndrome, which is a condition that causes a disruption of the heart's normal rhythm. Tell a health care provider about: Any allergies you have. All medicines you are taking, including vitamins, herbs, eye drops, creams, and over-the-counter medicines. Any problems you or family members have had with anesthetic medicines. Any blood disorders you have. Any surgeries you have had. Any medical conditions you have. Whether you are pregnant or may be pregnant. What are the risks? Generally, this is a safe procedure. However, problems may occur, including: Infection. Bleeding. Allergic reactions to medicines used during the procedure. Blood clots. Swelling or bruising. Damage to nearby structures or organs, such as nerves, lungs, blood vessels, or the heart where the ICD leads or pulse generator is implanted. What happens before the procedure? Staying hydrated Follow instructions from your health care provider about hydration, which may include: Up to 2 hours before the procedure - you may continue to drink clear liquids, such as water, clear fruit juice, black coffee, and plain tea.  Eating and drinking restrictions Follow instructions from your health care provider about eating and drinking, which may include: 8 hours before the procedure - stop eating heavy meals or foods, such as meat, fried foods, or fatty foods. 6 hours before the procedure - stop eating light meals or foods, such as toast or cereal. 6 hours before the procedure - stop drinking milk or drinks that contain milk. 2 hours before the procedure - stop drinking clear liquids. Medicines Ask your health care provider about: Changing or stopping your regular medicines. This is especially important if you are taking  diabetes medicines or blood thinners. Taking medicines such as  aspirin and ibuprofen. These medicines can thin your blood. Do not take these medicines unless your health care provider tells you to take them. Taking over-the-counter medicines, vitamins, herbs, and supplements. Tests You may have an exam or testing. These may include: Blood tests. A test to check the electrical signals in your heart (electrocardiogram, ECG). Imaging tests, such as a chest X-ray. Echocardiogram. This is an ultrasound of your heart to evaluate your heart structures and function. An event monitor or Holter monitor to wear at home. General instructions Do not use any products that contain nicotine or tobacco for at least 4 weeks before the procedure. These products include cigarettes, chewing tobacco, and vaping devices, such as e-cigarettes. If you need help quitting, ask your health care provider. Ask your health care provider: How your procedure site will be marked. What steps will be taken to help prevent infection. These may include: Removing hair at the surgery site. Washing skin with a germ-killing soap. Taking antibiotic medicine. You may be asked to shower with a germ-killing soap. Plan to have a responsible adult take you home from the hospital or clinic. What happens during the procedure?  Small monitors will be put on your body. They will be used to check your heart rate, blood pressure, and oxygen level. A pair of sticky pads (defibrillator pads) may be placed on your back and chest. These pads are able to pace your heart as needed during the procedure. An IV will be inserted into one of your veins. You will be given one or more of the following: A medicine to help you relax (sedative). A medicine to numb the area (local anesthetic). A medicine to make you fall asleep(general anesthetic). A small incision will be made to create a deep pocket under the skin of your chest or abdomen. Leads will be guided through a blood vessel into your heart and attached to  your heart muscles. Depending on the ICD, the leads may go into one ventricle, or they may go into both ventricles and into an upper chamber of the heart. An X-ray machine (fluoroscope) will be used to help guide the leads. The other end of the leads will be attached to the pulse generator. The pulse generator will be placed into the pocket under the skin. The ICD will be tested, and your health care provider will program the ICD for the condition being treated. The incision will be closed with stitches (sutures), skin glue, adhesive strips, or staples. A bandage (dressing) will be placed over the incision. The procedure may vary among health care providers and hospitals. What happens after the procedure? Your blood pressure, heart rate, breathing rate, and blood oxygen level will be monitored until you leave the hospital or clinic. Your health care provider will also monitor your ICD to make sure it is working properly. A chest X-ray will be taken to check that the ICD is in the right place. Do not raise the arm on the side of your procedure higher than your shoulder for as long as told by your health care provider. This is usually at least 6 weeks. You may be given an identification card explaining that you have an ICD. You will be given a remote home monitoring device to use with your ICD to allow your device to communicate with your clinic. Summary An implantable cardioverter defibrillator (ICD) is a device that identifies and corrects abnormal heart rhythms. Cardioverter defibrillator implantation is  a surgery to place an ICD under the skin in the chest or abdomen. An ICD consists of a battery, a small computer (pulse generator), and wires (leads) that go into the heart. During the procedure, the ICD will be tested, and your health care provider will program the ICD for the condition being treated. After the procedure, a chest X-ray will be taken to check that the ICD is in the right  place. This information is not intended to replace advice given to you by your health care provider. Make sure you discuss any questions you have with your health care provider. Document Revised: 04/02/2020 Document Reviewed: 04/02/2020 Elsevier Patient Education  2022 [employer].

## 2021-07-09 ENCOUNTER — Other Ambulatory Visit: Payer: Self-pay | Admitting: Cardiology

## 2021-07-09 ENCOUNTER — Other Ambulatory Visit: Payer: Self-pay

## 2021-07-09 NOTE — Telephone Encounter (Signed)
This is Dr. Schumann's pt 

## 2021-07-10 ENCOUNTER — Telehealth (HOSPITAL_COMMUNITY): Payer: Self-pay | Admitting: *Deleted

## 2021-07-10 NOTE — Telephone Encounter (Signed)
Attempted to call patient regarding upcoming cardiac MRI appointment. Left message with family member with name and call back number.  Larey Brick RN Navigator Cardiac Imaging [facility] [phone] Office [phone] Cell

## 2021-07-13 ENCOUNTER — Other Ambulatory Visit: Payer: Self-pay

## 2021-07-13 ENCOUNTER — Ambulatory Visit (HOSPITAL_COMMUNITY)
Admission: RE | Admit: 2021-07-13 | Discharge: 2021-07-13 | Disposition: A | Discharge status: Home / Self Care | Payer: Medicare Other | Source: Ambulatory Visit | Attending: Cardiology | Admitting: Cardiology

## 2021-07-13 DIAGNOSIS — I5042 Chronic combined systolic (congestive) and diastolic (congestive) heart failure: Secondary | ICD-10-CM | POA: Diagnosis not present

## 2021-07-13 MED ORDER — GADOBUTROL 1 MMOL/ML IV SOLN
10.0000 mL | Freq: Once | INTRAVENOUS | Status: AC | PRN
  Administered 2021-07-13: 10 mL via INTRAVENOUS

## 2021-07-13 NOTE — Telephone Encounter (Signed)
OK form cardiac standpoint, we can give letter

## 2021-07-14 ENCOUNTER — Other Ambulatory Visit: Payer: Self-pay | Admitting: *Deleted

## 2021-07-14 MED ORDER — BLOOD GLUCOSE TEST VI STRP: ORAL_STRIP | 2 refills | Status: DC

## 2021-07-17 ENCOUNTER — Other Ambulatory Visit: Payer: Self-pay

## 2021-07-17 ENCOUNTER — Ambulatory Visit (INDEPENDENT_AMBULATORY_CARE_PROVIDER_SITE_OTHER): Payer: Medicare Other | Admitting: Family Medicine

## 2021-07-17 VITALS — BP 118/68 | HR 64 | Temp 98.3°F | Ht 75.0 in | Wt 241.0 lb

## 2021-07-17 DIAGNOSIS — E118 Type 2 diabetes mellitus with unspecified complications: Secondary | ICD-10-CM

## 2021-07-17 DIAGNOSIS — I5022 Chronic systolic (congestive) heart failure: Secondary | ICD-10-CM

## 2021-07-17 DIAGNOSIS — I2510 Atherosclerotic heart disease of native coronary artery without angina pectoris: Secondary | ICD-10-CM | POA: Diagnosis not present

## 2021-07-17 DIAGNOSIS — I25119 Atherosclerotic heart disease of native coronary artery with unspecified angina pectoris: Secondary | ICD-10-CM | POA: Diagnosis not present

## 2021-07-17 MED ORDER — BLOOD GLUCOSE TEST VI STRP: ORAL_STRIP | 2 refills | Status: AC

## 2021-07-17 MED ORDER — EMPAGLIFLOZIN 25 MG PO TABS: 25.0000 mg | ORAL_TABLET | Freq: Every day | ORAL | 3 refills | Status: DC

## 2021-07-17 NOTE — Progress Notes (Signed)
Subjective:    Patient ID: Keith Schwartz, male    DOB: 01-05-1951, 70 y.o.   MRN: [id number]  HPI 10/2020 Recently, the patient had a syncopal episode.  He was at [facility].  He states that he been feeling weak and tired or as he put it "punk".  He checked his blood pressure at [facility] pharmacy and found it to be in the 90s systolic.  He then went to stand in line.  He had to defecate.  While he was waiting in line, he started to feel extremely lightheaded and lost consciousness falling and collapsing to the floor.  He lost control of his bowels.  There was no witnessed seizure activity.  However, EMS was contacted and the patient was taken to the emergency room at [facility].  I reviewed the emergency room physician's findings.  I reviewed the ER EKG.  There is a noticeable interventricular conduction delay with a widened QRS complex.  Patient denies any history of this although he states that when he was younger they wanted him to see a heart specialist who told him everything was fine.  He denies any recent stress test or echocardiogram.  Also the patient has poor R wave progression in the precordial leads concerning for possible anteroseptal infarction.  He denies any history of congestive heart failure or syncope or arrhythmia.  However he does have a history of poorly controlled diabetes.  His medical care is provided at the [place] therefore I have no records to review here.  At that time, my plan was:  Given his age, diabetes, and interventricular conduction delay, I believe he would benefit from a cardiology consultation for an echocardiogram as well as a cardiac monitor.  I am concerned about suppressed ejection fraction and sudden cardiac death due to cardiac arrhythmia.  If the patient is found to have an EF less than 30% with prolonged QRS complex, he may benefit from a ICD placement as well as additional work-up.  I discussed this with him.  Although the risk of this is low, given his abnormal EKG, I  do feel that he would benefit from this and also a cardiac monitor to evaluate for any arrhythmia.  In the meantime of asked him to hold the lisinopril and start checking his blood pressure at home to see what his blood pressure is doing at home.  07/17/21 Patient has not seen me since then but has been following up with cardiology.  Echo 12/09/20 showed EF 30%.  Cath 12/22/20 showed nonobstructive CAD.  He did have diffuse LAD disease for which medical therapy was advised.  He worse a zio monitor 2/22 which revealed only a 4 beat episode of NSVT as well as short SVT (longest 10 beats).  Recently saw Dr. Johney Frame for discussion about ICD placement and I copied his plan below:  "Ischemic CM/ CAD/ LBBB The patient has an ischemic CM (EF 30%), NYHA Class III CHF, and CAD.  He is referred by Dr Bjorn Pippin for risk stratification of sudden death and consideration of ICD implantation.  At this time, he meets MADIT II/ SCD-HeFT criteria for ICD implantation for primary prevention of sudden death.  He has QRS > 150 msec with LBBB and therefore also meets criteria for CRT.  I have had a thorough discussion with the patient reviewing options.  The patient has had opportunities to ask questions and have them answered. The patient and I have decided together through a shared decision making process to proceed with  BiVICD at this time.   Risks, benefits, alternatives to BiV ICD implantation were discussed in detail with the patient today. The patient understands that the risks include but are not limited to bleeding, infection, pneumothorax, perforation, tamponade, vascular damage, renal failure, MI, stroke, death, inappropriate shocks, and lead dislodgement and wishes to proceed.     He reports that it is currently hunting season.  He does not wish to proceed with ICD during hunting season but would prefer to have this done "after the first of the year".  I will therefore schedule with Dr Ladona Ridgel."  Patient has been receiving  his care at the [facility] in [place].  However he has become dissatisfied and is requesting that we start to monitor his blood sugars.  His last A1c was obtained July 8 and was greater than 7.  His goal would be less than 7 and 6.5 if possible.  He is currently on metformin, glimepiride, Trulicity as well as Jardiance 10 mg a day.  Given his congestive heart failure I would like to maximize the Jardiance.  His fasting blood sugars are typically below 100 and are averaging in the 90s.  He has drastically changed his diet and his fasting blood sugar sound excellent.  He is not checking his 2-hour postprandial sugars.  Recently had labs checking his fasting lipid panel in August.  He is on maximum dose Lipitor.   Past Medical History:  Diagnosis Date   Arthritis    CHF (congestive heart failure) (HCC)    Coronary artery disease    Diabetes mellitus without complication (HCC) 10/18/2000   History of kidney stones    Hypertension 10/18/2000   Ischemic cardiomyopathy    Pneumonia    walking   Sleep apnea    uses cpap at times   Past Surgical History:  Procedure Laterality Date   APPENDECTOMY  10/18/1968   HERNIA REPAIR     INTRAVASCULAR PRESSURE WIRE/FFR STUDY N/A 12/22/2020   Procedure: INTRAVASCULAR PRESSURE WIRE/FFR STUDY;  Surgeon: Tonny Bollman, MD;  Location: [facility] INVASIVE CV LAB;  Service: Cardiovascular;  Laterality: N/A;   JOINT REPLACEMENT  10/18/2010   rt knee   RIGHT/LEFT HEART CATH AND CORONARY ANGIOGRAPHY N/A 12/22/2020   Procedure: RIGHT/LEFT HEART CATH AND CORONARY ANGIOGRAPHY;  Surgeon: Tonny Bollman, MD;  Location: [facility] INVASIVE CV LAB;  Service: Cardiovascular;  Laterality: N/A;   VASECTOMY  10/18/1984   Current Outpatient Medications on File Prior to Visit  Medication Sig Dispense Refill   Ascorbic Acid (VITAMIN C) 1000 MG tablet Take 1,000 mg by mouth daily.     aspirin 81 MG EC tablet Take 81 mg by mouth daily.     atorvastatin (LIPITOR) 80 MG tablet Take 1 tablet (80 mg  total) by mouth daily. 90 tablet 3   clotrimazole-betamethasone (LOTRISONE) cream Apply 1 application topically 2 (two) times daily. 30 g 2   ENTRESTO 24-26 MG TAKE 1 TABLET BY MOUTH TWICE DAILY 180 tablet 3   glimepiride (AMARYL) 4 MG tablet Take 4 mg by mouth 2 (two) times daily.     Glucose Blood (BLOOD GLUCOSE TEST STRIPS) STRP Please dispense based on patient and insurance preference. Use as directed to monitor FSBS 3x daily. Dx: C5885. 200 strip 2   hydrOXYzine (ATARAX/VISTARIL) 25 MG tablet Take 1 tablet (25 mg total) by mouth 3 (three) times daily as needed. (Patient taking differently: Take 25 mg by mouth at bedtime.) 90 tablet 1   JARDIANCE 10 MG TABS tablet Take 10 mg  by mouth daily.     magnesium oxide (MAG-OX) 400 MG tablet Take 400 mg by mouth daily.     melatonin 5 MG TABS Take 10 mg by mouth at bedtime as needed (sleep).     metFORMIN (GLUCOPHAGE) 1000 MG tablet TAKE 1 TABLET TWICE A DAY WITH MEALS (Patient taking differently: Take 1,000 mg by mouth 2 (two) times daily with a meal.) 60 tablet 0   metoprolol succinate (TOPROL XL) 50 MG 24 hr tablet Take 1 tablet (50 mg total) by mouth daily. 90 tablet 3   Multiple Vitamin (MULTIVITAMIN) tablet Take 1 tablet by mouth daily. MEN'S ONE A DAY 50+     nitroGLYCERIN (NITROSTAT) 0.4 MG SL tablet Place 1 tablet (0.4 mg total) under the tongue every 5 (five) minutes as needed for chest pain. 25 tablet 3   pramipexole (MIRAPEX) 1 MG tablet Take 1 mg by mouth at bedtime.     TRULICITY 1.5 MG/0.5ML SOPN INJECT 1 PEN UNDER THE SKIN ONCE A WEEK (Patient taking differently: Inject 1.5 mg as directed every Thursday.) 6 mL 3   Vitamin D3 (VITAMIN D) 25 MCG tablet Take 1,000 Units by mouth daily.     vitamin E 400 UNIT capsule Take 400 Units by mouth daily.     zinc gluconate 50 MG tablet Take 50 mg by mouth daily.     No current facility-administered medications on file prior to visit.   Allergies  Allergen Reactions   Glipizide Hives and Rash    Penicillins Rash    Add as: Penicillin G/ Childhood   Social History   Socioeconomic History   Marital status: Married    Spouse name: Not on file   Number of children: Not on file   Years of education: Not on file   Highest education level: Not on file  Occupational History   Not on file  Tobacco Use   Smoking status: Never   Smokeless tobacco: Never  Vaping Use   Vaping Use: Never used  Substance and Sexual Activity   Alcohol use: Not Currently    Alcohol/week: 1.0 standard drink    Types: 1 Cans of beer per week   Drug use: Never   Sexual activity: Not Currently    Birth control/protection: Surgical  Other Topics Concern   Not on file  Social History Narrative   Lives in [place] with spouse.   Retired Visual merchandiser.   Former Chief Technology Officer, Engineer, site, Printmaker    Social Determinants of Corporate investment banker Strain: Not on [employer] Insecurity: Not on file  Transportation Needs: Not on file  Physical Activity: Not on file  Stress: Not on file  Social Connections: Not on file  Intimate Partner Violence: Not on file      Review of Systems  All other systems reviewed and are negative.     Objective:   Physical Exam Vitals reviewed.  Cardiovascular:     Rate and Rhythm: Normal rate and regular rhythm.  Pulmonary:     Effort: Pulmonary effort is normal.     Breath sounds: Normal breath sounds.  Neurological:     Mental Status: He is alert.          Assessment & Plan:  Chronic systolic heart failure (HCC)  Coronary artery disease involving native coronary artery of native heart with angina pectoris (HCC)  Controlled type 2 diabetes mellitus with complication, without long-term current use of insulin (HCC) - Plan: COMPLETE METABOLIC PANEL WITH  GFR, Hemoglobin A1c, Microalbumin, urine, CANCELED: CBC with Differential/Platelet, CANCELED: Lipid panel Our entire visit today was spent in discussion.  I recommended fasting blood sugars between 80  and 120 and 2-hour postprandial sugars between 80 and 160.  His fasting blood sugars sound like they are in goal.  I want him to start checking his 2-hour postprandial sugars and notify me of the values in 2 weeks.  I would also like to increase his Jardiance to 25 mg a day to try to achieve this.  He is due for repeat A1c along with fasting lab work in October.  I recommended he return after October 8 so that we can check this along with a urine microalbumin.  Blood pressure today is well within range.

## 2021-07-27 ENCOUNTER — Other Ambulatory Visit: Payer: Medicare Other

## 2021-07-27 ENCOUNTER — Other Ambulatory Visit: Payer: Self-pay

## 2021-07-27 DIAGNOSIS — E118 Type 2 diabetes mellitus with unspecified complications: Secondary | ICD-10-CM | POA: Diagnosis not present

## 2021-07-28 LAB — HEMOGLOBIN A1C
Hgb A1c MFr Bld: 6.4 % of total Hgb — ABNORMAL HIGH (ref <5.7)
Mean Plasma Glucose: 137 mg/dL
eAG (mmol/L): 7.6 mmol/L

## 2021-07-28 LAB — COMPLETE METABOLIC PANEL WITHOUT GFR
AG Ratio: 2 (calc) (ref 1.0–2.5)
ALT: 41 U/L (ref 9–46)
AST: 28 U/L (ref 10–35)
Albumin: 4.3 g/dL (ref 3.6–5.1)
Alkaline phosphatase (APISO): 81 U/L (ref 35–144)
BUN/Creatinine Ratio: 22 (calc) (ref 6–22)
BUN: 30 mg/dL — ABNORMAL HIGH (ref 7–25)
CO2: 28 mmol/L (ref 20–32)
Calcium: 9.8 mg/dL (ref 8.6–10.3)
Chloride: 104 mmol/L (ref 98–110)
Creat: 1.35 mg/dL — ABNORMAL HIGH (ref 0.70–1.28)
Globulin: 2.2 g/dL (ref 1.9–3.7)
Glucose, Bld: 104 mg/dL — ABNORMAL HIGH (ref 65–99)
Potassium: 4.8 mmol/L (ref 3.5–5.3)
Sodium: 139 mmol/L (ref 135–146)
Total Bilirubin: 0.6 mg/dL (ref 0.2–1.2)
Total Protein: 6.5 g/dL (ref 6.1–8.1)
eGFR: 56 mL/min/1.73m2 — ABNORMAL LOW

## 2021-07-28 LAB — MICROALBUMIN, URINE: Microalb, Ur: 0.5 mg/dL

## 2021-07-29 ENCOUNTER — Telehealth: Payer: Self-pay | Admitting: Cardiology

## 2021-07-29 NOTE — Telephone Encounter (Signed)
*  STAT* If patient is at the pharmacy, call can be transferred to refill team.   1. Which medications need to be refilled? (please list name of each medication and dose if known)   ENTRESTO 24-26 MG    2. Which pharmacy/location (including street and city if local pharmacy) is medication to be sent to? [facility] - [place], [place] - [address]   3. Do they need a 30 day or 90 day supply?  90 day  

## 2021-08-23 NOTE — Progress Notes (Signed)
Cardiology Office Note:    Date:  08/25/2021   ID:  Keith Schwartz, DOB 1951/04/04, MRN [id number]  PCP:  Susy Frizzle, MD  Cardiologist:  Donato Heinz, MD  Electrophysiologist:  None   Referring MD: Susy Frizzle, MD   No chief complaint on file.   History of Present Illness:    Keith Schwartz is a 70 y.o. male with a hx of CAD, chronic combined systolic and also heart failure, T2DM, hypertension who presents for follow-up.  He was referred by Dr. Dennard Schaumann for evaluation of syncope, initially seen on 11/12/2020.  He had an episode of syncope on 10/16/2020.  Reports he had bronchitis at the time and had taken DayQuil that morning, in addition to taking lisinopril.  He was at [facility] and had checked his blood pressure and noted it was low, 90s over 60s.  Subsequently was standing in the checkout line and suddenly passed out.  Denies any prodromal symptoms.  States that he fell to the ground, did not hit his head.  Thinks he was unconscious only for a few seconds.  EMS was called and reportedly BP was down to 70s.  He received IV fluids and BP up to 90s on arrival to ED.  In ED, EKG showed left bundle branch block.  Labs notable for creatinine 1.75.  He has not taken lisinopril since discharge from ED.  He denies any lightheadedness or syncope since this episode, or prior to that.  Denies any palpitations.  Does report he has been having chest pain.  Describes as pressure in center of upper chest that occurs with exertion.  States that he will continue to exert himself and it will subside, typically last for 1 to 2 minutes.  No smoking history.  Family history includes father had MI and died of CHF in 71s.  Echocardiogram on 12/09/2020 showed EF 30%, normal RV function.  Lexiscan Myoview on 12/09/2020 showed large severe fixed defect involving inferior and anterior walls with preservation of lateral wall and LVEF 24%, suggesting extensive infarct.  Cardiac catheterization on  12/22/2020 showed diffusely diseased LAD with positive RFR, nonobstructive LCx stenosis, mild diffuse RCA disease, normal right heart pressures.  Medical therapy recommended.  Zio patch x14 days on 12/15/2020 showed 1 episode of NSVT lasting 4 beats, 3 episodes of SVT longest lasting 10 beats.  Echocardiogram 05/19/21 shows LVEF 30 to 35%, dyssynchrony consistent with LBBB, normal RV function, no significant valvular disease.  Cardiac MRI on 07/13/2021 showed LV EF 34%, RVEF 52%, RV insertion site LGE.   Since last clinic visit, he reports he has been doing okay.  Did report some chest pain when he overexerted himself recently.  Also felt short of breath.  States that he laid down and it resolved.  Did not take nitroglycerin.  He reports episodes of chest pain have been rare.  Denies any lightheadedness or syncope.  Denies any lower extremity edema or palpitations.   Wt Readings from Last 3 Encounters:  08/25/21 247 lb 12.8 oz (112.4 kg)  07/17/21 241 lb (109.3 kg)  07/06/21 241 lb 3.2 oz (109.4 kg)     Past Medical History:  Diagnosis Date   Arthritis    CHF (congestive heart failure) (HCC)    Coronary artery disease    Diabetes mellitus without complication ([place]) [id number]   History of kidney stones    Hypertension 10/18/2000   Ischemic cardiomyopathy    Pneumonia    walking   Sleep apnea  uses cpap at times    Past Surgical History:  Procedure Laterality Date   APPENDECTOMY  10/18/1968   HERNIA REPAIR     INTRAVASCULAR PRESSURE WIRE/FFR STUDY N/A 12/22/2020   Procedure: INTRAVASCULAR PRESSURE WIRE/FFR STUDY;  Surgeon: Sherren Mocha, MD;  Location: [place] [facility];  Service: Cardiovascular;  Laterality: N/A;   JOINT REPLACEMENT  10/18/2010   rt knee   RIGHT/LEFT HEART CATH AND CORONARY ANGIOGRAPHY N/A 12/22/2020   Procedure: RIGHT/LEFT HEART CATH AND CORONARY ANGIOGRAPHY;  Surgeon: Sherren Mocha, MD;  Location: [place] [facility];  Service: Cardiovascular;  Laterality: N/A;    VASECTOMY  10/18/1984    Current Medications: Current Meds  Medication Sig   Ascorbic Acid (VITAMIN C) 1000 MG tablet Take 1,000 mg by mouth daily.   aspirin 81 MG EC tablet Take 81 mg by mouth daily.   clotrimazole-betamethasone (LOTRISONE) cream Apply 1 application topically 2 (two) times daily.   empagliflozin (JARDIANCE) 25 MG TABS tablet Take 12.5 mg by mouth daily.   glimepiride (AMARYL) 4 MG tablet Take 4 mg by mouth 2 (two) times daily.   Glucose Blood (BLOOD GLUCOSE TEST STRIPS) STRP Please dispense based on patient and insurance preference. Use as directed to monitor FSBS 3x daily. [id number].   hydrOXYzine (ATARAX/VISTARIL) 25 MG tablet Take 1 tablet (25 mg total) by mouth 3 (three) times daily as needed. (Patient taking differently: Take 25 mg by mouth at bedtime.)   magnesium oxide (MAG-OX) 400 MG tablet Take 400 mg by mouth daily.   melatonin 5 MG TABS Take 10 mg by mouth at bedtime as needed (sleep).   metFORMIN (GLUCOPHAGE) 1000 MG tablet TAKE 1 TABLET TWICE A DAY WITH MEALS (Patient taking differently: Take 1,000 mg by mouth 2 (two) times daily with a meal.)   metoprolol succinate (TOPROL XL) 50 MG 24 hr tablet Take 1 tablet (50 mg total) by mouth daily.   Multiple Vitamin (MULTIVITAMIN) tablet Take 1 tablet by mouth daily. MEN'S ONE A DAY 50+   pramipexole (MIRAPEX) 1 MG tablet Take 1 mg by mouth at bedtime.   TRULICITY 1.5 0000000 SOPN INJECT 1 PEN UNDER THE SKIN ONCE A WEEK (Patient taking differently: Inject 1.5 mg as directed every Thursday.)   Vitamin D3 (VITAMIN D) 25 MCG tablet Take 1,000 Units by mouth daily.   vitamin E 400 UNIT capsule Take 400 Units by mouth daily.   zinc gluconate 50 MG tablet Take 50 mg by mouth daily.   [DISCONTINUED] empagliflozin (JARDIANCE) 25 MG TABS tablet Take 1 tablet (25 mg total) by mouth daily before breakfast.   [DISCONTINUED] ENTRESTO 24-26 MG TAKE 1 TABLET BY MOUTH TWICE DAILY     Allergies:   Glipizide and Penicillins    Social History   Socioeconomic History   Marital status: Married    Spouse name: Not on file   Number of children: Not on file   Years of education: Not on file   Highest education level: Not on file  Occupational History   Not on file  Tobacco Use   Smoking status: Never   Smokeless tobacco: Never  Vaping Use   Vaping Use: Never used  Substance and Sexual Activity   Alcohol use: Not Currently    Alcohol/week: 1.0 standard drink    Types: 1 Cans of beer per week   Drug use: Never   Sexual activity: Not Currently    Birth control/protection: Surgical  Other Topics Concern   Not on file  Social  History Narrative   Lives in [place] with spouse.   Retired Psychologist, sport and exercise.   Former Microbiologist, Education officer, museum, Oceanographer    Social Determinants of Radio broadcast assistant Strain: Not on Art therapist Insecurity: Not on file  Transportation Needs: Not on file  Physical Activity: Not on file  Stress: Not on file  Social Connections: Not on file     Family History: The patient's family history includes Arthritis in his mother; Asthma in his mother; Early death in his sister; Hearing loss in his mother; Heart disease in his father; Hyperlipidemia in his father; Hypertension in his father; Vision loss in his mother.  ROS:   Please see the history of present illness. All other systems reviewed and are negative.  EKGs/Labs/Other Studies Reviewed:    The following studies were reviewed today:  EKG:   12/30/2020: normal sinus rhythm, rate 80, left bundle branch block 02/16/2021: EKG is not ordered today.   Recent Labs: 06/02/2021: Hemoglobin 14.2; Magnesium 2.0; Platelets 190 07/27/2021: ALT 41; BUN 30; Creat 1.35; Potassium 4.8; Sodium 139  Recent Lipid Panel    Component Value Date/Time   CHOL 109 06/02/2021 1205   TRIG 194 (H) 06/02/2021 1205   HDL 30 (L) 06/02/2021 1205   CHOLHDL 3.6 06/02/2021 1205   LDLCALC 47 06/02/2021 1205    Physical Exam:    VS:  BP 115/60    Pulse 73   Ht 6\' 3"  (1.905 m)   Wt 247 lb 12.8 oz (112.4 kg)   SpO2 98%   BMI 30.97 kg/m     Wt Readings from Last 3 Encounters:  08/25/21 247 lb 12.8 oz (112.4 kg)  07/17/21 241 lb (109.3 kg)  07/06/21 241 lb 3.2 oz (109.4 kg)     GEN: Well nourished, well developed in no acute distress HEENT: Normal NECK: No JVD; No carotid bruits CARDIAC:RRR, 2 out of 6 systolic murmur RESPIRATORY:  Clear to auscultation without rales, wheezing or rhonchi  ABDOMEN: Soft, non-tender, non-distended MUSCULOSKELETAL:  No edema; No deformity  SKIN: Warm and dry NEUROLOGIC:  Alert and oriented x 3 PSYCHIATRIC:  Normal affect   ASSESSMENT:    1. CAD in native artery   2. Chronic combined systolic and diastolic heart failure ([place])   3. Essential hypertension     PLAN:    CAD: Reported chest pain concerning for angina and EKG with left bundle branch block.  Echocardiogram on 12/09/2020 showed EF 30%, normal RV function.  Lexiscan Myoview on 12/09/2020 showed large severe fixed defect involving inferior and anterior walls with preservation of lateral wall and LVEF 24%, suggesting extensive infarct.  Cardiac catheterization on 12/22/2020 showed diffusely diseased LAD with positive RFR, nonobstructive LCx stenosis, mild diffuse RCA disease, normal right heart pressures.  Medical management recommended for obstructive LAD disease.  Reports occasional exertional chest pain, has not had to use nitroglycerin -Continue aspirin 81 mg daily -Continue atorvastatin 80 mg daily.  LDL 47 on 06/02/2021 -Continue Toprol-XL 25 mg daily -As needed sublingual nitroglycerin.   Chronic combined systolic and diastolic heart failure: EF 30% on echocardiogram 12/09/2020.  Diffuse severe LAD disease as above, medical management recommended.  Echocardiogram 05/19/21 shows LVEF 30 to 35%, dyssynchrony consistent with LBBB.  Cardiac MRI on 07/13/2021 showed LV EF 34%, RVEF 52%, RV insertion site LGE. -Continue Entresto 24-26 mg twice  daily.  Developed hypotension/hyperkalemia on 49-51 mg BID -Continue Toprol-XL 25 mg daily -Continue Jardiance 10 mg daily -Check BMP, if stable renal function/potassium  we will plan to add spironolactone 12.5 mg daily -EF remains <35% despite GDMT, and considering his LBBB with QRS , referred to EP for evaluation for CRT-D.  He was seen by Dr. Johney Frame and is agreeable to proceeding with BiV ICD, but wants to wait until after the first of the year   Syncope: Suspect due to hypotension, which may have been due to dehydration in setting of URI and continuing to take lisinopril.  SBP in 70s on EMS arrival.  No prodromal symptoms, arrhythmia remains on differential.  Echocardiogram on 12/09/2020 showed EF 30%, normal RV function.   Zio patch x14 days on 12/15/2020 showed 1 episode of NSVT lasting 4 beats, 3 episodes of SVT longest lasting 10 beats.   Hypertension: Continue Entresto and Toprol-XL as above   Hyperlipidemia: On atorvastatin 80 mg daily.  LDL 47 on 06/02/2021   T2DM: On metformin, glimepiride, Trulicity, Jardiance.  A1c 6.4% on 07/27/2021   CKD stage 3a: Creatinine 1.75 on 10/16/2020, improved to creatinine 1.27 on 3/3. Cr 1.4 on 07/27/2021   Leg pain: ABIs indicate noncompressible vessels, normal TBI's on 01/08/2021.  Lower extremity duplex shows heavy calcifications, questionable distal occlusion in the left posterior tibial artery.     RTC in 3 months     Medication Adjustments/Labs and Tests Ordered: Current medicines are reviewed at length with the patient today.  Concerns regarding medicines are outlined above.  Orders Placed This Encounter  Procedures   Basic metabolic panel   Magnesium    Meds ordered this encounter  Medications   sacubitril-valsartan (ENTRESTO) 24-26 MG    Sig: Take 1 tablet by mouth 2 (two) times daily.    Dispense:  180 tablet    Refill:  3     Patient Instructions  Medication Instructions:  Your physician recommends that you continue on  your current medications as directed. Please refer to the Current Medication list given to you today.  *If you need a refill on your cardiac medications before your next appointment, please call your pharmacy*   Lab Work: BMET, Mag today  If you have labs (blood work) drawn today and your tests are completely normal, you will receive your results only by: MyChart Message (if you have MyChart) OR A paper copy in the mail If you have any lab test that is abnormal or we need to change your treatment, we will call you to review the results.  Follow-Up: At [facility], you and your health needs are our priority.  As part of our continuing mission to provide you with exceptional heart care, we have created designated Provider Care Teams.  These Care Teams include your primary Cardiologist (physician) and Advanced Practice Providers (APPs -  Physician Assistants and Nurse Practitioners) who all work together to provide you with the care you need, when you need it.  We recommend signing up for the patient portal called "MyChart".  Sign up information is provided on this After Visit Summary.  MyChart is used to connect with patients for Virtual Visits (Telemedicine).  Patients are able to view lab/test results, encounter notes, upcoming appointments, etc.  Non-urgent messages can be sent to your provider as well.   To learn more about what you can do with MyChart, go to [email or web address].    Your next appointment:   3 month(s) with PA/NP 6 months with Dr. Bjorn Pippin      Signed, Little Ishikawa, MD  08/25/2021 2:25 PM    [facility]

## 2021-08-25 ENCOUNTER — Other Ambulatory Visit: Payer: Self-pay

## 2021-08-25 ENCOUNTER — Ambulatory Visit (INDEPENDENT_AMBULATORY_CARE_PROVIDER_SITE_OTHER): Payer: Medicare Other | Admitting: Cardiology

## 2021-08-25 ENCOUNTER — Encounter: Payer: Self-pay | Admitting: Cardiology

## 2021-08-25 VITALS — BP 115/60 | HR 73 | Ht 75.0 in | Wt 247.8 lb

## 2021-08-25 DIAGNOSIS — I5042 Chronic combined systolic (congestive) and diastolic (congestive) heart failure: Secondary | ICD-10-CM | POA: Diagnosis not present

## 2021-08-25 DIAGNOSIS — I25118 Atherosclerotic heart disease of native coronary artery with other forms of angina pectoris: Secondary | ICD-10-CM | POA: Diagnosis not present

## 2021-08-25 DIAGNOSIS — I10 Essential (primary) hypertension: Secondary | ICD-10-CM

## 2021-08-25 DIAGNOSIS — I2510 Atherosclerotic heart disease of native coronary artery without angina pectoris: Secondary | ICD-10-CM | POA: Diagnosis not present

## 2021-08-25 DIAGNOSIS — R55 Syncope and collapse: Secondary | ICD-10-CM | POA: Diagnosis not present

## 2021-08-25 MED ORDER — ENTRESTO 24-26 MG PO TABS: 1.0000 | ORAL_TABLET | Freq: Two times a day (BID) | ORAL | 3 refills | Status: DC

## 2021-08-25 NOTE — Patient Instructions (Signed)
Medication Instructions:  Your physician recommends that you continue on your current medications as directed. Please refer to the Current Medication list given to you today.  *If you need a refill on your cardiac medications before your next appointment, please call your pharmacy*   Lab Work: BMET, Mag today  If you have labs (blood work) drawn today and your tests are completely normal, you will receive your results only by: MyChart Message (if you have MyChart) OR A paper copy in the mail If you have any lab test that is abnormal or we need to change your treatment, we will call you to review the results.  Follow-Up: At [facility], you and your health needs are our priority.  As part of our continuing mission to provide you with exceptional heart care, we have created designated Provider Care Teams.  These Care Teams include your primary Cardiologist (physician) and Advanced Practice Providers (APPs -  Physician Assistants and Nurse Practitioners) who all work together to provide you with the care you need, when you need it.  We recommend signing up for the patient portal called "MyChart".  Sign up information is provided on this After Visit Summary.  MyChart is used to connect with patients for Virtual Visits (Telemedicine).  Patients are able to view lab/test results, encounter notes, upcoming appointments, etc.  Non-urgent messages can be sent to your provider as well.   To learn more about what you can do with MyChart, go to [email or web address].    Your next appointment:   3 month(s) with PA/NP 6 months with Dr. Bjorn Pippin

## 2021-08-26 LAB — BASIC METABOLIC PANEL
BUN/Creatinine Ratio: 21 (ref 10–24)
BUN: 30 mg/dL — ABNORMAL HIGH (ref 8–27)
CO2: 24 mmol/L (ref 20–29)
Calcium: 9.6 mg/dL (ref 8.6–10.2)
Chloride: 103 mmol/L (ref 96–106)
Creatinine, Ser: 1.44 mg/dL — ABNORMAL HIGH (ref 0.76–1.27)
Glucose: 95 mg/dL (ref 70–99)
Potassium: 4.8 mmol/L (ref 3.5–5.2)
Sodium: 140 mmol/L (ref 134–144)
eGFR: 52 mL/min/{1.73_m2} — ABNORMAL LOW (ref >59)

## 2021-08-26 LAB — MAGNESIUM: Magnesium: 1.9 mg/dL (ref 1.6–2.3)

## 2021-08-27 ENCOUNTER — Telehealth: Payer: Self-pay | Admitting: Cardiology

## 2021-08-27 DIAGNOSIS — Z79899 Other long term (current) drug therapy: Secondary | ICD-10-CM

## 2021-08-27 DIAGNOSIS — I5042 Chronic combined systolic (congestive) and diastolic (congestive) heart failure: Secondary | ICD-10-CM

## 2021-08-27 MED ORDER — SPIRONOLACTONE 25 MG PO TABS: 12.5000 mg | ORAL_TABLET | Freq: Every day | ORAL | 3 refills | Status: DC

## 2021-08-27 NOTE — Telephone Encounter (Signed)
Called [facility] to clarify prescription-pharmacist states patient is filling lisinopril 40 mg tablet at a local pharmacy-last fill date 06/09/21 at [facility].  Advised to continue with prescription refill and will reach out to patient and wife to discuss lisinopril.     Called wife (ok per DPR) to discuss-she confirmed patient is NOT taking lisinopril.    Also discussed lab results and recommendations:    Little Ishikawa, MD  08/26/2021  4:57 PM EST Back to Top    Stable labs, would start spironolactone 12.5 mg daily and repeat BMET in 1 week    Rx sent to pharmacy and lab order placed.  Wife verbalized understanding.

## 2021-08-27 NOTE — Telephone Encounter (Signed)
   Pt c/o medication issue:  1. Name of Medication: sacubitril-valsartan (ENTRESTO) 24-26 MG  2. How are you currently taking this medication (dosage and times per day)?   3. Are you having a reaction (difficulty breathing--STAT)?   4. What is your medication issue? Pharmacy needs clarification about dosage instructions    Patient's wife got a call from the pharmacy. They were requesting clarification about how to fill this rx. The pharmacy gave the patient's wife the phone # of [phone]. Please call and ask for a representative

## 2021-09-21 ENCOUNTER — Other Ambulatory Visit: Payer: Self-pay

## 2021-09-21 DIAGNOSIS — Z79899 Other long term (current) drug therapy: Secondary | ICD-10-CM

## 2021-09-21 DIAGNOSIS — I5042 Chronic combined systolic (congestive) and diastolic (congestive) heart failure: Secondary | ICD-10-CM

## 2021-09-22 LAB — BASIC METABOLIC PANEL
BUN/Creatinine Ratio: 18 (ref 10–24)
BUN: 26 mg/dL (ref 8–27)
CO2: 17 mmol/L — ABNORMAL LOW (ref 20–29)
Calcium: 10 mg/dL (ref 8.6–10.2)
Chloride: 103 mmol/L (ref 96–106)
Creatinine, Ser: 1.42 mg/dL — ABNORMAL HIGH (ref 0.76–1.27)
Glucose: 131 mg/dL — ABNORMAL HIGH (ref 70–99)
Potassium: 5.2 mmol/L (ref 3.5–5.2)
Sodium: 140 mmol/L (ref 134–144)
eGFR: 53 mL/min/{1.73_m2} — ABNORMAL LOW (ref >59)

## 2021-09-24 ENCOUNTER — Other Ambulatory Visit: Payer: Self-pay | Admitting: Cardiology

## 2021-09-24 ENCOUNTER — Ambulatory Visit (INDEPENDENT_AMBULATORY_CARE_PROVIDER_SITE_OTHER): Payer: Medicare Other | Admitting: Internal Medicine

## 2021-09-24 ENCOUNTER — Other Ambulatory Visit: Payer: Medicare Other

## 2021-09-24 ENCOUNTER — Other Ambulatory Visit: Payer: Self-pay

## 2021-09-24 ENCOUNTER — Telehealth: Payer: Self-pay | Admitting: Cardiology

## 2021-09-24 ENCOUNTER — Encounter: Payer: Self-pay | Admitting: Internal Medicine

## 2021-09-24 VITALS — BP 118/64 | HR 79 | Ht 75.5 in | Wt 247.0 lb

## 2021-09-24 DIAGNOSIS — I5042 Chronic combined systolic (congestive) and diastolic (congestive) heart failure: Secondary | ICD-10-CM

## 2021-09-24 DIAGNOSIS — I447 Left bundle-branch block, unspecified: Secondary | ICD-10-CM | POA: Diagnosis not present

## 2021-09-24 DIAGNOSIS — I2510 Atherosclerotic heart disease of native coronary artery without angina pectoris: Secondary | ICD-10-CM

## 2021-09-24 MED ORDER — METOPROLOL SUCCINATE ER 50 MG PO TB24: 50.0000 mg | ORAL_TABLET | Freq: Every day | ORAL | 3 refills | Status: DC

## 2021-09-24 MED ORDER — SPIRONOLACTONE 25 MG PO TABS: 12.5000 mg | ORAL_TABLET | Freq: Every day | ORAL | 3 refills | Status: DC

## 2021-09-24 NOTE — Progress Notes (Signed)
HPI [name] is referred today by Dr. Greggory Brandy to consider biv ICD insertion. He is a pleasant 70 yo man with an ICM, s/p MI, with severe CAD and class 2 CHF and LBBB with a QRS duration of 160. He has not had syncope. He has mild chronic renal insufficiency. The patient saw Dr. Greggory Brandy in September regarding ICD insertion but wanted to hold off until after deer season. He presents today for additional evaluation. No syncope. He has class 2B symptoms. Allergies  Allergen Reactions   Glipizide Hives and Rash   Penicillins Rash    Add as: Penicillin G/ Childhood     Current Outpatient Medications  Medication Sig Dispense Refill   Ascorbic Acid (VITAMIN C) 1000 MG tablet Take 1,000 mg by mouth daily.     aspirin 81 MG EC tablet Take 81 mg by mouth daily.     clotrimazole-betamethasone (LOTRISONE) cream Apply 1 application topically 2 (two) times daily. 30 g 2   empagliflozin (JARDIANCE) 25 MG TABS tablet Take 12.5 mg by mouth daily.     glimepiride (AMARYL) 4 MG tablet Take 4 mg by mouth 2 (two) times daily.     Glucose Blood (BLOOD GLUCOSE TEST STRIPS) STRP Please dispense based on patient and insurance preference. Use as directed to monitor FSBS 3x daily. Dx: [id number]. 200 strip 2   hydrOXYzine (ATARAX/VISTARIL) 25 MG tablet Take 1 tablet (25 mg total) by mouth 3 (three) times daily as needed. (Patient taking differently: Take 25 mg by mouth at bedtime.) 90 tablet 1   magnesium oxide (MAG-OX) 400 MG tablet Take 400 mg by mouth daily.     melatonin 5 MG TABS Take 10 mg by mouth at bedtime as needed (sleep).     metFORMIN (GLUCOPHAGE) 1000 MG tablet TAKE 1 TABLET TWICE A DAY WITH MEALS (Patient taking differently: Take 1,000 mg by mouth 2 (two) times daily with a meal.) 60 tablet 0   metoprolol succinate (TOPROL XL) 50 MG 24 hr tablet Take 1 tablet (50 mg total) by mouth daily. 90 tablet 3   Multiple Vitamin (MULTIVITAMIN) tablet Take 1 tablet by mouth daily. MEN'S ONE A DAY 50+      nitroGLYCERIN (NITROSTAT) 0.4 MG SL tablet Place 1 tablet (0.4 mg total) under the tongue every 5 (five) minutes as needed for chest pain. 25 tablet 3   pramipexole (MIRAPEX) 1 MG tablet Take 1 mg by mouth at bedtime.     sacubitril-valsartan (ENTRESTO) 24-26 MG Take 1 tablet by mouth 2 (two) times daily. 180 tablet 3   spironolactone (ALDACTONE) 25 MG tablet Take 0.5 tablets (12.5 mg total) by mouth daily. 45 tablet 3   TRULICITY 1.5 0000000 SOPN INJECT 1 PEN UNDER THE SKIN ONCE A WEEK (Patient taking differently: Inject 1.5 mg as directed every Thursday.) 6 mL 3   Vitamin D3 (VITAMIN D) 25 MCG tablet Take 1,000 Units by mouth daily.     vitamin E 400 UNIT capsule Take 400 Units by mouth daily.     zinc gluconate 50 MG tablet Take 50 mg by mouth daily.     atorvastatin (LIPITOR) 80 MG tablet Take 1 tablet (80 mg total) by mouth daily. 90 tablet 3   No current facility-administered medications for this visit.     Past Medical History:  Diagnosis Date   Arthritis    CHF (congestive heart failure) (HCC)    Coronary artery disease    Diabetes mellitus without complication ([place]) [id number]  History of kidney stones    Hypertension 10/18/2000   Ischemic cardiomyopathy    Pneumonia    walking   Sleep apnea    uses cpap at times    ROS:   All systems reviewed and negative except as noted in the HPI.   Past Surgical History:  Procedure Laterality Date   APPENDECTOMY  10/18/1968   HERNIA REPAIR     INTRAVASCULAR PRESSURE WIRE/FFR STUDY N/A 12/22/2020   Procedure: INTRAVASCULAR PRESSURE WIRE/FFR STUDY;  Surgeon: Sherren Mocha, MD;  Location: [place] [facility];  Service: Cardiovascular;  Laterality: N/A;   JOINT REPLACEMENT  10/18/2010   rt knee   RIGHT/LEFT HEART CATH AND CORONARY ANGIOGRAPHY N/A 12/22/2020   Procedure: RIGHT/LEFT HEART CATH AND CORONARY ANGIOGRAPHY;  Surgeon: Sherren Mocha, MD;  Location: [place] [facility];  Service: Cardiovascular;  Laterality: N/A;    VASECTOMY  10/18/1984     Family History  Problem Relation Age of Onset   Arthritis Mother    Asthma Mother    Hearing loss Mother    Vision loss Mother    Heart disease Father    Hyperlipidemia Father    Hypertension Father    Early death Sister      Social History   Socioeconomic History   Marital status: Married    Spouse name: Not on file   Number of children: Not on file   Years of education: Not on file   Highest education level: Not on file  Occupational History   Not on file  Tobacco Use   Smoking status: Never   Smokeless tobacco: Never  Vaping Use   Vaping Use: Never used  Substance and Sexual Activity   Alcohol use: Not Currently    Alcohol/week: 1.0 standard drink    Types: 1 Cans of beer per week   Drug use: Never   Sexual activity: Not Currently    Birth control/protection: Surgical  Other Topics Concern   Not on file  Social History Narrative   Lives in [place] with spouse.   Retired Psychologist, sport and exercise.   Former Microbiologist, Education officer, museum, Oceanographer    Social Determinants of Radio broadcast assistant Strain: Not on Art therapist Insecurity: Not on file  Transportation Needs: Not on file  Physical Activity: Not on file  Stress: Not on file  Social Connections: Not on file  Intimate Partner Violence: Not on file     BP 118/64   Pulse 79   Ht 6' 3.5" (1.918 m)   Wt 247 lb (112 kg)   BMI 30.47 kg/m   Physical Exam:  Well appearing NAD HEENT: Unremarkable Neck:  No JVD, no thyromegally Lymphatics:  No adenopathy Back:  No CVA tenderness Lungs:  Clear with no wheezes HEART:  Regular rate rhythm, no murmurs, no rubs, no clicks with split S2.  Abd:  soft, positive bowel sounds, no organomegally, no rebound, no guarding Ext:  2 plus pulses, no edema, no cyanosis, no clubbing Skin:  No rashes no nodules Neuro:  CN II through XII intact, motor grossly intact  EKG - NSR with LBBB  DEVICE  Normal device function.  See PaceArt for details.    Assess/Plan:  Chronic systolic heart failure - He symptoms are class 2B. He is on GDMT. I have reviewed the treatment options and he wishes to proceed with biv ICD insertion. We will schedule in the coming weeks. CAD - he denies anginal symptoms.  HTN  - his bp is  well controlled on medical therapy.  4.  Stage 3 renal insuff. - we will avoid the use of IV contrast with his implant.   Sharlot Gowda Winda Summerall,MD

## 2021-09-24 NOTE — H&P (View-Only) (Signed)
HPI [name] is referred today by Dr. Greggory Brandy to consider biv ICD insertion. He is a pleasant 70 yo man with an ICM, s/p MI, with severe CAD and class 2 CHF and LBBB with a QRS duration of 160. He has not had syncope. He has mild chronic renal insufficiency. The patient saw Dr. Greggory Brandy in September regarding ICD insertion but wanted to hold off until after deer season. He presents today for additional evaluation. No syncope. He has class 2B symptoms. Allergies  Allergen Reactions   Glipizide Hives and Rash   Penicillins Rash    Add as: Penicillin G/ Childhood     Current Outpatient Medications  Medication Sig Dispense Refill   Ascorbic Acid (VITAMIN C) 1000 MG tablet Take 1,000 mg by mouth daily.     aspirin 81 MG EC tablet Take 81 mg by mouth daily.     clotrimazole-betamethasone (LOTRISONE) cream Apply 1 application topically 2 (two) times daily. 30 g 2   empagliflozin (JARDIANCE) 25 MG TABS tablet Take 12.5 mg by mouth daily.     glimepiride (AMARYL) 4 MG tablet Take 4 mg by mouth 2 (two) times daily.     Glucose Blood (BLOOD GLUCOSE TEST STRIPS) STRP Please dispense based on patient and insurance preference. Use as directed to monitor FSBS 3x daily. Dx: [id number]. 200 strip 2   hydrOXYzine (ATARAX/VISTARIL) 25 MG tablet Take 1 tablet (25 mg total) by mouth 3 (three) times daily as needed. (Patient taking differently: Take 25 mg by mouth at bedtime.) 90 tablet 1   magnesium oxide (MAG-OX) 400 MG tablet Take 400 mg by mouth daily.     melatonin 5 MG TABS Take 10 mg by mouth at bedtime as needed (sleep).     metFORMIN (GLUCOPHAGE) 1000 MG tablet TAKE 1 TABLET TWICE A DAY WITH MEALS (Patient taking differently: Take 1,000 mg by mouth 2 (two) times daily with a meal.) 60 tablet 0   metoprolol succinate (TOPROL XL) 50 MG 24 hr tablet Take 1 tablet (50 mg total) by mouth daily. 90 tablet 3   Multiple Vitamin (MULTIVITAMIN) tablet Take 1 tablet by mouth daily. MEN'S ONE A DAY 50+      nitroGLYCERIN (NITROSTAT) 0.4 MG SL tablet Place 1 tablet (0.4 mg total) under the tongue every 5 (five) minutes as needed for chest pain. 25 tablet 3   pramipexole (MIRAPEX) 1 MG tablet Take 1 mg by mouth at bedtime.     sacubitril-valsartan (ENTRESTO) 24-26 MG Take 1 tablet by mouth 2 (two) times daily. 180 tablet 3   spironolactone (ALDACTONE) 25 MG tablet Take 0.5 tablets (12.5 mg total) by mouth daily. 45 tablet 3   TRULICITY 1.5 0000000 SOPN INJECT 1 PEN UNDER THE SKIN ONCE A WEEK (Patient taking differently: Inject 1.5 mg as directed every Thursday.) 6 mL 3   Vitamin D3 (VITAMIN D) 25 MCG tablet Take 1,000 Units by mouth daily.     vitamin E 400 UNIT capsule Take 400 Units by mouth daily.     zinc gluconate 50 MG tablet Take 50 mg by mouth daily.     atorvastatin (LIPITOR) 80 MG tablet Take 1 tablet (80 mg total) by mouth daily. 90 tablet 3   No current facility-administered medications for this visit.     Past Medical History:  Diagnosis Date   Arthritis    CHF (congestive heart failure) (HCC)    Coronary artery disease    Diabetes mellitus without complication ([place]) [id number]  History of kidney stones    Hypertension 10/18/2000   Ischemic cardiomyopathy    Pneumonia    walking   Sleep apnea    uses cpap at times    ROS:   All systems reviewed and negative except as noted in the HPI.   Past Surgical History:  Procedure Laterality Date   APPENDECTOMY  10/18/1968   HERNIA REPAIR     INTRAVASCULAR PRESSURE WIRE/FFR STUDY N/A 12/22/2020   Procedure: INTRAVASCULAR PRESSURE WIRE/FFR STUDY;  Surgeon: Sherren Mocha, MD;  Location: [facility];  Service: Cardiovascular;  Laterality: N/A;   JOINT REPLACEMENT  10/18/2010   rt knee   RIGHT/LEFT HEART CATH AND CORONARY ANGIOGRAPHY N/A 12/22/2020   Procedure: RIGHT/LEFT HEART CATH AND CORONARY ANGIOGRAPHY;  Surgeon: Sherren Mocha, MD;  Location: [facility];  Service: Cardiovascular;  Laterality: N/A;    VASECTOMY  10/18/1984     Family History  Problem Relation Age of Onset   Arthritis Mother    Asthma Mother    Hearing loss Mother    Vision loss Mother    Heart disease Father    Hyperlipidemia Father    Hypertension Father    Early death Sister      Social History   Socioeconomic History   Marital status: Married    Spouse name: Not on file   Number of children: Not on file   Years of education: Not on file   Highest education level: Not on file  Occupational History   Not on file  Tobacco Use   Smoking status: Never   Smokeless tobacco: Never  Vaping Use   Vaping Use: Never used  Substance and Sexual Activity   Alcohol use: Not Currently    Alcohol/week: 1.0 standard drink    Types: 1 Cans of beer per week   Drug use: Never   Sexual activity: Not Currently    Birth control/protection: Surgical  Other Topics Concern   Not on file  Social History Narrative   Lives in [place] with spouse.   Retired Psychologist, sport and exercise.   Former Microbiologist, Education officer, museum, Oceanographer    Social Determinants of Radio broadcast assistant Strain: Not on Art therapist Insecurity: Not on file  Transportation Needs: Not on file  Physical Activity: Not on file  Stress: Not on file  Social Connections: Not on file  Intimate Partner Violence: Not on file     BP 118/64    Pulse 79    Ht 6' 3.5" (1.918 m)    Wt 247 lb (112 kg)    BMI 30.47 kg/m   Physical Exam:  Well appearing NAD HEENT: Unremarkable Neck:  No JVD, no thyromegally Lymphatics:  No adenopathy Back:  No CVA tenderness Lungs:  Clear with no wheezes HEART:  Regular rate rhythm, no murmurs, no rubs, no clicks with split S2.  Abd:  soft, positive bowel sounds, no organomegally, no rebound, no guarding Ext:  2 plus pulses, no edema, no cyanosis, no clubbing Skin:  No rashes no nodules Neuro:  CN II through XII intact, motor grossly intact  EKG - NSR with LBBB  DEVICE  Normal device function.  See PaceArt for details.    Assess/Plan:  Chronic systolic heart failure - He symptoms are class 2B. He is on GDMT. I have reviewed the treatment options and he wishes to proceed with biv ICD insertion. We will schedule in the coming weeks. CAD - he denies anginal symptoms.  HTN  -  his bp is well controlled on medical therapy.  4.  Stage 3 renal insuff. - we will avoid the use of IV contrast with his implant.   Carleene Overlie Lawyer Washabaugh,MD

## 2021-09-24 NOTE — Telephone Encounter (Signed)
Spoke with pt, aware of the results. He has an appointment with dr Ladona Ridgel today and will make him an appointment for lab work while there. Patient voiced understanding to go to the lab before leaving. Refill sent to the pharmacy electronically.

## 2021-09-24 NOTE — Patient Instructions (Addendum)
Medication Instructions:  Your physician recommends that you continue on your current medications as directed. Please refer to the Current Medication list given to you today.  Labwork: I am adding a CBC to your labwork today.  Testing/Procedures: Your physician has recommended that you have a defibrillator inserted. An implantable cardioverter defibrillator (ICD) is a small device that is placed in your chest or, in rare cases, your abdomen. This device uses electrical pulses or shocks to help control life-threatening, irregular heartbeats that could lead the heart to suddenly stop beating (sudden cardiac arrest). Leads are attached to the ICD that goes into your heart. This is done in the hospital and usually requires an overnight stay. Please see the instruction sheet given to you today for more information.  Follow-Up:  SEE INSTRUCTION LETTER  Any Other Special Instructions Will Be Listed Below (If Applicable).  If you need a refill on your cardiac medications before your next appointment, please call your pharmacy.

## 2021-09-24 NOTE — Telephone Encounter (Signed)
-----   Message from Little Ishikawa, MD sent at 09/24/2021  6:59 AM EST ----- Stable kidney function.  Bicarb is low, and anion gap is elevated.  Recommend repeat BMET and checking lactate

## 2021-09-24 NOTE — Telephone Encounter (Signed)
*  STAT* If patient is at the pharmacy, call can be transferred to refill team.   1. Which medications need to be refilled? (please list name of each medication and dose if known)  metoprolol succinate (TOPROL XL) 50 MG 24 hr tablet spironolactone (ALDACTONE) 25 MG tablet  2. Which pharmacy/location (including street and city if local pharmacy) is medication to be sent to? [id number] - [place], [place] - [address]  3. Do they need a 30 day or 90 day supply? 90 days supply

## 2021-09-25 LAB — BASIC METABOLIC PANEL
BUN/Creatinine Ratio: 17 (ref 10–24)
BUN: 26 mg/dL (ref 8–27)
CO2: 22 mmol/L (ref 20–29)
Calcium: 9.5 mg/dL (ref 8.6–10.2)
Chloride: 99 mmol/L (ref 96–106)
Creatinine, Ser: 1.49 mg/dL — ABNORMAL HIGH (ref 0.76–1.27)
Glucose: 144 mg/dL — ABNORMAL HIGH (ref 70–99)
Potassium: 4.8 mmol/L (ref 3.5–5.2)
Sodium: 138 mmol/L (ref 134–144)
eGFR: 50 mL/min/{1.73_m2} — ABNORMAL LOW (ref >59)

## 2021-09-25 LAB — CBC WITH DIFFERENTIAL/PLATELET
Basophils Absolute: 0.1 10*3/uL (ref 0.0–0.2)
Basos: 1 %
EOS (ABSOLUTE): 0.8 10*3/uL — ABNORMAL HIGH (ref 0.0–0.4)
Eos: 7 %
Hematocrit: 44.9 % (ref 37.5–51.0)
Hemoglobin: 14.9 g/dL (ref 13.0–17.7)
Immature Grans (Abs): 0.1 10*3/uL (ref 0.0–0.1)
Immature Granulocytes: 1 %
Lymphocytes Absolute: 2.6 10*3/uL (ref 0.7–3.1)
Lymphs: 25 %
MCH: 29.9 pg (ref 26.6–33.0)
MCHC: 33.2 g/dL (ref 31.5–35.7)
MCV: 90 fL (ref 79–97)
Monocytes Absolute: 0.8 10*3/uL (ref 0.1–0.9)
Monocytes: 7 %
Neutrophils Absolute: 6.1 10*3/uL (ref 1.4–7.0)
Neutrophils: 59 %
Platelets: 194 10*3/uL (ref 150–450)
RBC: 4.99 x10E6/uL (ref 4.14–5.80)
RDW: 12.4 % (ref 11.6–15.4)
WBC: 10.4 10*3/uL (ref 3.4–10.8)

## 2021-09-25 LAB — LACTIC ACID, PLASMA: Lactate, Ven: 18.6 mg/dL (ref 4.8–25.7)

## 2021-10-04 ENCOUNTER — Encounter: Payer: Self-pay | Admitting: Cardiology

## 2021-10-04 DIAGNOSIS — I5042 Chronic combined systolic (congestive) and diastolic (congestive) heart failure: Secondary | ICD-10-CM

## 2021-10-06 NOTE — Telephone Encounter (Signed)
Was this the spironolactone that he started?  Yes he will need BMET done 1 week after starting spironolactone

## 2021-10-14 ENCOUNTER — Other Ambulatory Visit: Payer: Self-pay

## 2021-10-14 ENCOUNTER — Ambulatory Visit
Admission: EM | Admit: 2021-10-14 | Discharge: 2021-10-14 | Disposition: A | Discharge status: Home / Self Care | Payer: Medicare Other | Attending: Urgent Care | Admitting: Urgent Care

## 2021-10-14 ENCOUNTER — Encounter: Payer: Self-pay | Admitting: Internal Medicine

## 2021-10-14 DIAGNOSIS — L729 Follicular cyst of the skin and subcutaneous tissue, unspecified: Secondary | ICD-10-CM | POA: Diagnosis not present

## 2021-10-14 DIAGNOSIS — L089 Local infection of the skin and subcutaneous tissue, unspecified: Secondary | ICD-10-CM

## 2021-10-14 MED ORDER — DOXYCYCLINE HYCLATE 100 MG PO CAPS: 100.0000 mg | ORAL_CAPSULE | Freq: Two times a day (BID) | ORAL | 0 refills | Status: DC

## 2021-10-14 NOTE — ED Triage Notes (Signed)
Pt presents with abscess to upper left back for past week

## 2021-10-14 NOTE — Discharge Instructions (Addendum)
You have multiple cysts or possibly a deep space cyst that is infected.  We opened it and drained it today.  However, you will need to see a specialist like a dermatologist to have definitive treatment.  Please call and schedule an appointment with a dermatologist through the [facility] as soon as possible.  In the meantime start taking doxycycline for the infection.  Use Tylenol for pain.  Please change your dressing 3-5 times daily. Do not apply any ointments or creams. Each time you change your dressing, make sure that you are pressing on the wound to get pus to come out.  Try your best to have a family member help you clean gently around the perimeter of the wound with gentle soap and warm water. Pat your wound dry and let it air out if possible to make sure it is dry before reapplying another dressing.

## 2021-10-14 NOTE — ED Provider Notes (Signed)
-[facility]   MRN: [id number] DOB: April 18, 1951  Subjective:   Keith Schwartz is a 70 y.o. male presenting for 1 week history of persistent and worsening pain over the thoracic back.  Patient has noted some hard bumps and redness.  No drainage of pus or bleeding, fever.  Has never had difficulty with cystoscopy or infection such as abscesses before.  Patient gets his health care through the [place].  No current facility-administered medications for this encounter.  Current Outpatient Medications:    Ascorbic Acid (VITAMIN C) 1000 MG tablet, Take 1,000 mg by mouth daily., Disp: , Rfl:    aspirin 81 MG EC tablet, Take 81 mg by mouth daily., Disp: , Rfl:    atorvastatin (LIPITOR) 80 MG tablet, Take 1 tablet (80 mg total) by mouth daily., Disp: 90 tablet, Rfl: 3   clotrimazole-betamethasone (LOTRISONE) cream, Apply 1 application topically 2 (two) times daily., Disp: 30 g, Rfl: 2   empagliflozin (JARDIANCE) 25 MG TABS tablet, Take 12.5 mg by mouth daily., Disp: , Rfl:    glimepiride (AMARYL) 4 MG tablet, Take 4 mg by mouth 2 (two) times daily., Disp: , Rfl:    Glucose Blood (BLOOD GLUCOSE TEST STRIPS) STRP, Please dispense based on patient and insurance preference. Use as directed to monitor FSBS 3x daily. Dx: A2130., Disp: 200 strip, Rfl: 2   hydrOXYzine (ATARAX/VISTARIL) 25 MG tablet, Take 1 tablet (25 mg total) by mouth 3 (three) times daily as needed. (Patient taking differently: Take 25 mg by mouth at bedtime.), Disp: 90 tablet, Rfl: 1   magnesium oxide (MAG-OX) 400 MG tablet, Take 400 mg by mouth daily., Disp: , Rfl:    melatonin 5 MG TABS, Take 10 mg by mouth at bedtime as needed (sleep)., Disp: , Rfl:    metFORMIN (GLUCOPHAGE) 1000 MG tablet, TAKE 1 TABLET TWICE A DAY WITH MEALS (Patient taking differently: Take 1,000 mg by mouth 2 (two) times daily with a meal.), Disp: 60 tablet, Rfl: 0   metoprolol succinate (TOPROL XL) 50 MG 24 hr tablet, Take 1 tablet (50 mg total) by  mouth daily., Disp: 90 tablet, Rfl: 3   Multiple Vitamin (MULTIVITAMIN) tablet, Take 1 tablet by mouth daily. MEN'S ONE A DAY 50+, Disp: , Rfl:    nitroGLYCERIN (NITROSTAT) 0.4 MG SL tablet, Place 1 tablet (0.4 mg total) under the tongue every 5 (five) minutes as needed for chest pain., Disp: 25 tablet, Rfl: 3   pramipexole (MIRAPEX) 1 MG tablet, Take 1 mg by mouth at bedtime., Disp: , Rfl:    sacubitril-valsartan (ENTRESTO) 24-26 MG, Take 1 tablet by mouth 2 (two) times daily., Disp: 180 tablet, Rfl: 3   spironolactone (ALDACTONE) 25 MG tablet, Take 0.5 tablets (12.5 mg total) by mouth daily., Disp: 45 tablet, Rfl: 3   TRULICITY 1.5 MG/0.5ML SOPN, INJECT 1 PEN UNDER THE SKIN ONCE A WEEK (Patient taking differently: Inject 1.5 mg as directed every Thursday.), Disp: 6 mL, Rfl: 3   Vitamin D3 (VITAMIN D) 25 MCG tablet, Take 1,000 Units by mouth daily., Disp: , Rfl:    vitamin E 400 UNIT capsule, Take 400 Units by mouth daily., Disp: , Rfl:    zinc gluconate 50 MG tablet, Take 50 mg by mouth daily., Disp: , Rfl:    Allergies  Allergen Reactions   Glipizide Hives and Rash   Penicillins Rash    Add as: Penicillin G/ Childhood    Past Medical History:  Diagnosis Date   Arthritis  CHF (congestive heart failure) (HCC)    Coronary artery disease    Diabetes mellitus without complication (HCC) 10/18/2000   History of kidney stones    Hypertension 10/18/2000   Ischemic cardiomyopathy    Pneumonia    walking   Sleep apnea    uses cpap at times     Past Surgical History:  Procedure Laterality Date   APPENDECTOMY  10/18/1968   HERNIA REPAIR     INTRAVASCULAR PRESSURE WIRE/FFR STUDY N/A 12/22/2020   Procedure: INTRAVASCULAR PRESSURE WIRE/FFR STUDY;  Surgeon: Tonny Bollman, MD;  Location: [facility];  Service: Cardiovascular;  Laterality: N/A;   JOINT REPLACEMENT  10/18/2010   rt knee   RIGHT/LEFT HEART CATH AND CORONARY ANGIOGRAPHY N/A 12/22/2020   Procedure: RIGHT/LEFT HEART  CATH AND CORONARY ANGIOGRAPHY;  Surgeon: Tonny Bollman, MD;  Location: [facility]ASIVE [facility];  Service: Cardiovascular;  Laterality: N/A;   VASECTOMY  10/18/1984    Family History  Problem Relation Age of Onset   Arthritis Mother    Asthma Mother    Hearing loss Mother    Vision loss Mother    Heart disease Father    Hyperlipidemia Father    Hypertension Father    Early death Sister     Social History   Tobacco Use   Smoking status: Never   Smokeless tobacco: Never  Vaping Use   Vaping Use: Never used  Substance Use Topics   Alcohol use: Not Currently    Alcohol/week: 1.0 standard drink    Types: 1 Cans of beer per week   Drug use: Never    ROS   Objective:   Vitals: BP 117/67    Pulse 79    Temp 97.8 F (36.6 C)    Resp 20    SpO2 98%   Physical Exam Constitutional:      General: He is not in acute distress.    Appearance: Normal appearance. He is well-developed and normal weight. He is not ill-appearing, toxic-appearing or diaphoretic.  HENT:     Head: Normocephalic and atraumatic.     Right Ear: External ear normal.     Left Ear: External ear normal.     Nose: Nose normal.     Mouth/Throat:     Pharynx: Oropharynx is clear.  Eyes:     General: No scleral icterus.       Right eye: No discharge.        Left eye: No discharge.     Extraocular Movements: Extraocular movements intact.     Pupils: Pupils are equal, round, and reactive to light.  Cardiovascular:     Rate and Rhythm: Normal rate.  Pulmonary:     Effort: Pulmonary effort is normal.  Musculoskeletal:     Cervical back: Normal range of motion.  Skin:      Neurological:     Mental Status: He is alert and oriented to person, place, and time.  Psychiatric:        Mood and Affect: Mood normal.        Behavior: Behavior normal.        Thought Content: Thought content normal.        Judgment: Judgment normal.   PROCEDURE NOTE: I&D of Abscess Verbal consent obtained. Local anesthesia with 4cc  of 2% lidocaine epinephrine. Site cleansed with Betadine. Incision of 2cm was made using an 11 blade, 5cc expressed consisting of a mixture of pus and serosanguinous fluid. Wound cavity was explored with curved  hemostats and loculations loosened. Cleansed and dressed.   Assessment and Plan :   PDMP not reviewed this encounter.  1. Infected cyst of skin    Patient has multiple cysts or 1 deep cyst that is infected.  We performed successful incision and drainage today.  Start doxycycline for infection, Tylenol for pain.  Follow-up with dermatology as soon as possible.  Patient reports that he will pursue this through the [place]. Counseled patient on potential for adverse effects with medications prescribed/recommended today, ER and return-to-clinic precautions discussed, patient verbalized understanding.    Wallis Bamberg, [place] 10/14/21 [phone]

## 2021-10-16 NOTE — Pre-Procedure Instructions (Signed)
Attempted to call patient regarding procedure instructions.  Left voicemail on the following items: Arrival time 0900 Nothing to eat or drink after midnight No meds AM of procedure Responsible person to drive you home and stay with you for 24 hrs Wash with special soap night before and morning of procedure  

## 2021-10-20 ENCOUNTER — Other Ambulatory Visit: Payer: Self-pay

## 2021-10-20 ENCOUNTER — Ambulatory Visit (HOSPITAL_COMMUNITY): Payer: Medicare Other

## 2021-10-20 ENCOUNTER — Encounter (HOSPITAL_COMMUNITY)
Admission: RE | Disposition: A | Discharge status: Home / Self Care | Payer: Self-pay | Source: Home / Self Care | Attending: Internal Medicine

## 2021-10-20 ENCOUNTER — Ambulatory Visit (HOSPITAL_COMMUNITY)
Admission: RE | Admit: 2021-10-20 | Discharge: 2021-10-20 | Disposition: A | Discharge status: Home / Self Care | Payer: Medicare Other | Attending: Internal Medicine | Admitting: Internal Medicine

## 2021-10-20 DIAGNOSIS — I5022 Chronic systolic (congestive) heart failure: Secondary | ICD-10-CM | POA: Diagnosis not present

## 2021-10-20 DIAGNOSIS — Z95810 Presence of automatic (implantable) cardiac defibrillator: Secondary | ICD-10-CM

## 2021-10-20 DIAGNOSIS — I130 Hypertensive heart and chronic kidney disease with heart failure and stage 1 through stage 4 chronic kidney disease, or unspecified chronic kidney disease: Secondary | ICD-10-CM | POA: Insufficient documentation

## 2021-10-20 DIAGNOSIS — N183 Chronic kidney disease, stage 3 unspecified: Secondary | ICD-10-CM | POA: Diagnosis not present

## 2021-10-20 DIAGNOSIS — I255 Ischemic cardiomyopathy: Secondary | ICD-10-CM | POA: Insufficient documentation

## 2021-10-20 DIAGNOSIS — I2510 Atherosclerotic heart disease of native coronary artery without angina pectoris: Secondary | ICD-10-CM | POA: Diagnosis not present

## 2021-10-20 DIAGNOSIS — E1122 Type 2 diabetes mellitus with diabetic chronic kidney disease: Secondary | ICD-10-CM | POA: Insufficient documentation

## 2021-10-20 DIAGNOSIS — I447 Left bundle-branch block, unspecified: Secondary | ICD-10-CM | POA: Insufficient documentation

## 2021-10-20 DIAGNOSIS — I252 Old myocardial infarction: Secondary | ICD-10-CM | POA: Diagnosis not present

## 2021-10-20 HISTORY — PX: BIV ICD INSERTION CRT-D: EP1195

## 2021-10-20 LAB — GLUCOSE, CAPILLARY: Glucose-Capillary: 105 mg/dL — ABNORMAL HIGH (ref 70–99)

## 2021-10-20 SURGERY — BIV ICD INSERTION CRT-D

## 2021-10-20 MED ORDER — VANCOMYCIN HCL 1500 MG/300ML IV SOLN
1500.0000 mg | INTRAVENOUS | Status: AC
  Administered 2021-10-20: 1500 mg via INTRAVENOUS
  Filled 2021-10-20: qty 300

## 2021-10-20 MED ORDER — MIDAZOLAM HCL 5 MG/5ML IJ SOLN
INTRAMUSCULAR | Status: AC
  Filled 2021-10-20: qty 5

## 2021-10-20 MED ORDER — SODIUM CHLORIDE 0.9 % IV SOLN
80.0000 mg | INTRAVENOUS | Status: AC
  Administered 2021-10-20: 80 mg

## 2021-10-20 MED ORDER — FENTANYL CITRATE (PF) 100 MCG/2ML IJ SOLN
INTRAMUSCULAR | Status: DC | PRN
  Administered 2021-10-20 (×3): 12.5 ug via INTRAVENOUS
  Administered 2021-10-20 (×2): 25 ug via INTRAVENOUS

## 2021-10-20 MED ORDER — LIDOCAINE HCL (PF) 1 % IJ SOLN
INTRAMUSCULAR | Status: DC | PRN
  Administered 2021-10-20: 60 mL

## 2021-10-20 MED ORDER — POVIDONE-IODINE 10 % EX SWAB
2.0000 "application " | Freq: Once | CUTANEOUS | Status: AC
  Administered 2021-10-20: 2 via TOPICAL

## 2021-10-20 MED ORDER — SODIUM CHLORIDE 0.9 % IV SOLN
INTRAVENOUS | Status: AC
  Filled 2021-10-20: qty 2

## 2021-10-20 MED ORDER — FENTANYL CITRATE (PF) 100 MCG/2ML IJ SOLN
INTRAMUSCULAR | Status: AC
  Filled 2021-10-20: qty 2

## 2021-10-20 MED ORDER — IOHEXOL 350 MG/ML SOLN
INTRAVENOUS | Status: DC | PRN
  Administered 2021-10-20: 40 mL

## 2021-10-20 MED ORDER — MIDAZOLAM HCL 5 MG/5ML IJ SOLN
INTRAMUSCULAR | Status: DC | PRN
  Administered 2021-10-20: 1 mg via INTRAVENOUS
  Administered 2021-10-20: 2 mg via INTRAVENOUS
  Administered 2021-10-20 (×2): 1 mg via INTRAVENOUS
  Administered 2021-10-20: 2 mg via INTRAVENOUS

## 2021-10-20 MED ORDER — CHLORHEXIDINE GLUCONATE 4 % EX LIQD
4.0000 "application " | Freq: Once | CUTANEOUS | Status: DC
  Filled 2021-10-20: qty 60

## 2021-10-20 MED ORDER — ONDANSETRON HCL 4 MG/2ML IJ SOLN: 4.0000 mg | Freq: Four times a day (QID) | INTRAMUSCULAR | Status: DC | PRN

## 2021-10-20 MED ORDER — SODIUM CHLORIDE 0.9 % IV SOLN: INTRAVENOUS | Status: DC

## 2021-10-20 MED ORDER — ACETAMINOPHEN 325 MG PO TABS: 325.0000 mg | ORAL_TABLET | ORAL | Status: DC | PRN

## 2021-10-20 MED ORDER — HEPARIN (PORCINE) IN NACL 1000-0.9 UT/500ML-% IV SOLN
INTRAVENOUS | Status: DC | PRN
  Administered 2021-10-20: 500 mL

## 2021-10-20 MED ORDER — LIDOCAINE HCL 1 % IJ SOLN
INTRAMUSCULAR | Status: AC
  Filled 2021-10-20: qty 60

## 2021-10-20 SURGICAL SUPPLY — 21 items
BALLN CATH 6FR 90 (CATHETERS) ×2
CABLE SURGICAL S-101-97-12 (CABLE) ×3 IMPLANT
CATH ACUITYPRO 45CM EH 9F (CATHETERS) ×1 IMPLANT
CATH BALLOON 6FR 90 2LUMEN (CATHETERS) IMPLANT
CATH JOSEPH QUAD ALLRED 6F REP (CATHETERS) ×1 IMPLANT
CATH RIGHTSITE C315HIS02 (CATHETERS) ×1 IMPLANT
CATH SELECT PACE 669183 (CATHETERS) ×1 IMPLANT
ICD MOMENTUM G125 (ICD Generator) ×1 IMPLANT
LEAD INGEVITY 7841 52 (Lead) ×1 IMPLANT
LEAD INGEVITY 7842 59 (Lead) ×1 IMPLANT
LEAD RELIANCE 0138-64 (Lead) ×1 IMPLANT
LEAD SELECT SECURE 3830 383069 (Lead) IMPLANT
PAD DEFIB RADIO PHYSIO CONN (PAD) ×3 IMPLANT
SELECT SECURE 3830 383069 (Lead) ×2 IMPLANT
SHEATH 7FR PRELUDE SNAP 13 (SHEATH) ×1 IMPLANT
SHEATH 9.5FR PRELUDE SNAP 13 (SHEATH) ×1 IMPLANT
SHEATH 9FR PRELUDE SNAP 13 (SHEATH) ×1 IMPLANT
SLITTER 6232ADJ (MISCELLANEOUS) ×1 IMPLANT
TRAY PACEMAKER INSERTION (PACKS) ×3 IMPLANT
WIRE ACUITY WHISPER EDS 4648 (WIRE) ×1 IMPLANT
WIRE HI TORQ VERSACORE-J 145CM (WIRE) ×1 IMPLANT

## 2021-10-20 NOTE — Discharge Instructions (Signed)
After Your ICD (Implantable Cardiac Defibrillator)   You have a {Blank single:19197::"Medtronic","St. Jude","Boston Scientific","Biotronik"} ICD  ACTIVITY Do not lift your arm above shoulder height for 1 week after your procedure. After 7 days, you may progress as below.  You should remove your sling 24 hours after your procedure, unless otherwise instructed by your provider.     Tuesday October 27, 2021  Wednesday October 28, 2021 Thursday October 29, 2021 Friday October 30, 2021   Do not lift, push, pull, or carry anything over 10 pounds with the affected arm until 6 weeks (Tuesday December 01, 2021 ) after your procedure.   You may drive AFTER your wound check, unless you have been told otherwise by your provider.   Ask your healthcare provider when you can go back to work   INCISION/Dressing If you are on a blood thinner such as Coumadin, Xarelto, Eliquis, Plavix, or Pradaxa please confirm with your provider when this should be resumed. ***  If large square, outer bandage is left in place, this can be removed after 24 hours from your procedure. Do not remove steri-strips or glue as below.   Monitor your defibrillator site for redness, swelling, and drainage. Call the device clinic at [phone] if you experience these symptoms or fever/chills.  {Blank single:19197::"If your incision is sealed with Steri-strips or staples, you may shower 10 days after your procedure or when told by your provider. Do not remove the steri-strips or let the shower hit directly on your site. You may wash around your site with soap and water","If your incision is closed with Dermabond/Surgical glue. You may shower 1 day after your pacemaker implant and wash around the site with soap and water"}.    If you were discharged in a sling, please do not wear this during the day more than 48 hours after your surgery unless otherwise instructed. This may increase the risk of stiffness and soreness in your  shoulder.   Avoid lotions, ointments, or perfumes over your incision until it is well-healed.  You may use a hot tub or a pool AFTER your wound check appointment if the incision is completely closed.  Your ICD is designed to protect you from life threatening heart rhythms. Because of this, you may receive a shock.   1 shock with no symptoms:  Call the office during business hours. 1 shock with symptoms (chest pain, chest pressure, dizziness, lightheadedness, shortness of breath, overall feeling unwell):  Call 911. If you experience 2 or more shocks in 24 hours:  Call 911. If you receive a shock, you should not drive for 6 months per the [place] DMV IF you receive appropriate therapy from your ICD.   ICD Alerts:  Some alerts are vibratory and others beep. These are NOT emergencies. Please call our office to let us know. If this occurs at night or on weekends, it can wait until the next business day. Send a remote transmission.  If your device is capable of reading fluid status (for heart failure), you will be offered monthly monitoring to review this with you.   DEVICE MANAGEMENT Remote monitoring is used to monitor your ICD from home. This monitoring is scheduled every 91 days by our office. It allows [place] to keep an eye on the functioning of your device to ensure it is working properly. You will routinely see your Electrophysiologist annually (more often if necessary).   You should receive your ID card for your new device in 4-8 weeks. Keep this card with  you at all times once received. Consider wearing a medical alert bracelet or necklace.  Your ICD  may be MRI compatible. This will be discussed at your next office visit/wound check.  You should avoid contact with strong electric or magnetic fields.   Do not use amateur (ham) radio equipment or electric (arc) welding torches. MP3 player headphones with magnets should not be used. Some devices are safe to use if held at least 12 inches (30 cm) from  your defibrillator. These include power tools, lawn mowers, and speakers. If you are unsure if something is safe to use, ask your health care provider.  When using your cell phone, hold it to the ear that is on the opposite side from the defibrillator. Do not leave your cell phone in a pocket over the defibrillator.  You may safely use electric blankets, heating pads, computers, and microwave ovens.  Call the office right away if: You have chest pain. You feel more than one shock. You feel more short of breath than you have felt before. You feel more light-headed than you have felt before. Your incision starts to open up.  This information is not intended to replace advice given to you by your health care provider. Make sure you discuss any questions you have with your health care provider.

## 2021-10-20 NOTE — Interval H&P Note (Signed)
History and Physical Interval Note:  10/20/2021 10:26 AM  Keith Schwartz  has presented today for surgery, with the diagnosis of cardiomyopathy.  The various methods of treatment have been discussed with the patient and family. After consideration of risks, benefits and other options for treatment, the patient has consented to  Procedure(s): BIV ICD INSERTION CRT-D (N/A) as a surgical intervention.  The patient's history has been reviewed, patient examined, no change in status, stable for surgery.  I have reviewed the patient's chart and labs.  Questions were answered to the patient's satisfaction.     Lewayne Bunting

## 2021-10-21 ENCOUNTER — Encounter (HOSPITAL_COMMUNITY): Payer: Self-pay | Admitting: Internal Medicine

## 2021-10-29 ENCOUNTER — Ambulatory Visit: Payer: Medicare Other

## 2021-10-29 ENCOUNTER — Encounter: Payer: Self-pay | Admitting: Internal Medicine

## 2021-11-02 ENCOUNTER — Ambulatory Visit (INDEPENDENT_AMBULATORY_CARE_PROVIDER_SITE_OTHER): Payer: Medicare Other

## 2021-11-02 ENCOUNTER — Other Ambulatory Visit: Payer: Self-pay

## 2021-11-02 DIAGNOSIS — I5022 Chronic systolic (congestive) heart failure: Secondary | ICD-10-CM | POA: Diagnosis not present

## 2021-11-02 LAB — CUP PACEART INCLINIC DEVICE CHECK
Date Time Interrogation Session: 20230116141505
HighPow Impedance: 70 Ohm
Implantable Lead Implant Date: 20230103
Implantable Lead Implant Date: 20230103
Implantable Lead Implant Date: 20230103
Implantable Lead Location: 753858
Implantable Lead Location: 753859
Implantable Lead Location: 753860
Implantable Lead Model: 138
Implantable Lead Model: 3830
Implantable Lead Model: 7841
Implantable Lead Serial Number: 1150746
Implantable Lead Serial Number: 302782
Implantable Pulse Generator Implant Date: 20230103
Lead Channel Impedance Value: 468 Ohm
Lead Channel Impedance Value: 565 Ohm
Lead Channel Impedance Value: 658 Ohm
Lead Channel Pacing Threshold Amplitude: 0.7 V
Lead Channel Pacing Threshold Amplitude: 1.2 V
Lead Channel Pacing Threshold Amplitude: 1.3 V
Lead Channel Pacing Threshold Pulse Width: 0.4 ms
Lead Channel Pacing Threshold Pulse Width: 0.4 ms
Lead Channel Pacing Threshold Pulse Width: 0.4 ms
Lead Channel Sensing Intrinsic Amplitude: 21.4 mV
Lead Channel Sensing Intrinsic Amplitude: 5.1 mV
Lead Channel Sensing Intrinsic Amplitude: 6.5 mV
Lead Channel Setting Pacing Amplitude: 3.5 V
Lead Channel Setting Pacing Amplitude: 3.5 V
Lead Channel Setting Pacing Amplitude: 3.5 V
Lead Channel Setting Pacing Pulse Width: 0.4 ms
Lead Channel Setting Pacing Pulse Width: 0.4 ms
Lead Channel Setting Sensing Sensitivity: 0.6 mV
Lead Channel Setting Sensing Sensitivity: 1 mV
Pulse Gen Serial Number: 151244

## 2021-11-02 NOTE — Patient Instructions (Addendum)
° °  After Your ICD (Implantable Cardiac Defibrillator)    Monitor your defibrillator site for redness, swelling, and drainage. Call the device clinic at [phone] if you experience these symptoms or fever/chills.  Your incision was closed with Steri-strips or staples:  You may shower 7 days after your procedure and wash your incision with soap and water. Avoid lotions, ointments, or perfumes over your incision until it is well-healed.  You may use a hot tub or a pool after your wound check appointment if the incision is completely closed.  Do not lift, push or pull greater than 10 pounds with the affected arm until 12/01/2020. There are no other restrictions in arm movement after your wound check appointment.  If you need an MRI will need to contact [place] regarding MRI compatibility   Your ICD is designed to protect you from life threatening heart rhythms. Because of this, you may receive a shock.   1 shock with no symptoms:  Call the office during business hours. 1 shock with symptoms (chest pain, chest pressure, dizziness, lightheadedness, shortness of breath, overall feeling unwell):  Call 911. If you experience 2 or more shocks in 24 hours:  Call 911. If you receive a shock, you should not drive.  [place] DMV - no driving for 6 months if you receive appropriate therapy from your ICD.   ICD Alerts:  Some alerts are vibratory and others beep. These are NOT emergencies. Please call our office to let us know. If this occurs at night or on weekends, it can wait until the next business day. Send a remote transmission.  Remote monitoring is used to monitor your ICD from home. This monitoring is scheduled every 91 days by our office. It allows [place] to keep an eye on the functioning of your device to ensure it is working properly. You will routinely see your Electrophysiologist annually (more often if necessary).

## 2021-11-02 NOTE — Progress Notes (Signed)
Wound check appointment. Steri-strips removed. Wound without redness or edema. Incision edges approximated, wound well healed. Normal device function. Thresholds, sensing, and impedances consistent with implant measurements. Device programmed at 3.5V for extra safety margin until 3 month visit. Histogram distribution appropriate for patient and level of activity. No mode switches or ventricular arrhythmias noted. Patient educated about wound care, arm mobility, lifting restrictions, shock plan. ROV 01/21/2022 with GT

## 2021-11-25 ENCOUNTER — Telehealth: Payer: Self-pay | Admitting: Cardiology

## 2021-11-25 ENCOUNTER — Ambulatory Visit (INDEPENDENT_AMBULATORY_CARE_PROVIDER_SITE_OTHER): Payer: Medicare Other | Admitting: Nurse Practitioner

## 2021-11-25 ENCOUNTER — Encounter: Payer: Self-pay | Admitting: Nurse Practitioner

## 2021-11-25 ENCOUNTER — Other Ambulatory Visit: Payer: Self-pay

## 2021-11-25 VITALS — BP 102/50 | HR 72 | Ht 75.5 in | Wt 240.0 lb

## 2021-11-25 DIAGNOSIS — I10 Essential (primary) hypertension: Secondary | ICD-10-CM

## 2021-11-25 DIAGNOSIS — I255 Ischemic cardiomyopathy: Secondary | ICD-10-CM

## 2021-11-25 DIAGNOSIS — Z95810 Presence of automatic (implantable) cardiac defibrillator: Secondary | ICD-10-CM | POA: Diagnosis not present

## 2021-11-25 DIAGNOSIS — I5042 Chronic combined systolic (congestive) and diastolic (congestive) heart failure: Secondary | ICD-10-CM

## 2021-11-25 DIAGNOSIS — G4733 Obstructive sleep apnea (adult) (pediatric): Secondary | ICD-10-CM | POA: Diagnosis not present

## 2021-11-25 DIAGNOSIS — I447 Left bundle-branch block, unspecified: Secondary | ICD-10-CM | POA: Diagnosis not present

## 2021-11-25 NOTE — Telephone Encounter (Signed)
Pt c/o medication issue:  1. Name of Medication:  Losartan lisinopril  2. How are you currently taking this medication (dosage and times per day)? Was still taking but told to stop  3. Are you having a reaction (difficulty breathing--STAT)? no  4. What is your medication issue? Patient's wife states they were told to call back to inform whether the patient was taking the medications. She says he was taking them, but the office informed him to stop.

## 2021-11-25 NOTE — Patient Instructions (Addendum)
Medication Instructions:  Your physician has recommended you make the following change in your medication:   STOP Losartan  STOP Lisinopril   CALL us BACK WITH YOUR MEDICATION BOTTLES   *If you need a refill on your cardiac medications before your next appointment, please call your pharmacy*   Lab Work: TODAY:  BMP  If you have labs (blood work) drawn today and your tests are completely normal, you will receive your results only by: [place] (if you have MyChart) OR A paper copy in the mail If you have any lab test that is abnormal or we need to change your treatment, we will call you to review the results.   Testing/Procedures: None ordered   Follow-Up: At [facility], you and your health needs are our priority.  As part of our continuing mission to provide you with exceptional heart care, we have created designated Provider Care Teams.  These Care Teams include your primary Cardiologist (physician) and Advanced Practice Providers (APPs -  Physician Assistants and Nurse Practitioners) who all work together to provide you with the care you need, when you need it.  We recommend signing up for the patient portal called "MyChart".  Sign up information is provided on this After Visit Summary.  MyChart is used to connect with patients for Virtual Visits (Telemedicine).  Patients are able to view lab/test results, encounter notes, upcoming appointments, etc.  Non-urgent messages can be sent to your provider as well.   To learn more about what you can do with MyChart, go to [email or web address].    Your next appointment:   3 month(s)  02/25/2022 ARRIVE AT 1:45  The format for your next appointment:   In Person  Provider:   Donato Heinz, MD    Other Instructions Your physician recommends that you weigh, daily, at the same time every day, and in the same amount of clothing. If you notice a weight increase of 3 lbs in 1 day or 5 lbs in 1 week, please let us know.

## 2021-11-25 NOTE — Progress Notes (Signed)
Office Visit    Patient Name: Keith Schwartz Date of Encounter: 11/25/2021  Primary Care Provider:  Donita Brooks, MD Primary Cardiologist:  Little Ishikawa, MD  Chief Complaint    71 year old male with a history of CAD, chronic combined systolic and diastolic heart failure, ICM s/p ICD, LBBB, prior syncope, hypertension, type 2 diabetes, and OSA who presents for follow-up related to heart failure.  Past Medical History    Past Medical History:  Diagnosis Date   Arthritis    CHF (congestive heart failure) (HCC)    Coronary artery disease    Diabetes mellitus without complication (HCC) 10/18/2000   History of kidney stones    Hypertension 10/18/2000   Ischemic cardiomyopathy    Pneumonia    walking   Sleep apnea    uses cpap at times   Past Surgical History:  Procedure Laterality Date   APPENDECTOMY  10/18/1968   BIV ICD INSERTION CRT-D N/A 10/20/2021   Procedure: BIV ICD INSERTION CRT-D;  Surgeon: Marinus Maw, MD;  Location: [facility] INVASIVE CV LAB;  Service: Cardiovascular;  Laterality: N/A;   HERNIA REPAIR     INTRAVASCULAR PRESSURE WIRE/FFR STUDY N/A 12/22/2020   Procedure: INTRAVASCULAR PRESSURE WIRE/FFR STUDY;  Surgeon: Tonny Bollman, MD;  Location: [facility] INVASIVE CV LAB;  Service: Cardiovascular;  Laterality: N/A;   JOINT REPLACEMENT  10/18/2010   rt knee   RIGHT/LEFT HEART CATH AND CORONARY ANGIOGRAPHY N/A 12/22/2020   Procedure: RIGHT/LEFT HEART CATH AND CORONARY ANGIOGRAPHY;  Surgeon: Tonny Bollman, MD;  Location: [facility] INVASIVE CV LAB;  Service: Cardiovascular;  Laterality: N/A;   VASECTOMY  10/18/1984    Allergies  Allergies  Allergen Reactions   Glipizide Hives and Rash   Penicillins Rash    Add as: Penicillin G/ Childhood    History of Present Illness    71 year old male with the above past medical history including CAD, chronic combined systolic and diastolic heart failure, ICM s/p ICD, LBBB, prior syncope, hypertension, type 2  diabetes, and OSA.  He was initially evaluated by Dr. Bjorn Pippin in January 2022 following an episode of syncope. 14-day Zio patch showed 1 episode of NSVT, lasting 4 beats, 3 episodes of SVT, longest lasting 10 beats. Echocardiogram in February 2022 showed EF 30%.  Follow-up [facility] showed large severe fixed defect involving inferior and anterior walls with preservation of lateral wall and LVEF 24%, suggesting extensive prior infarct. Cardiac catheterization in March 2022 showed diffusely diseased LAD with positive RFR, nonobstructive LCx stenosis, mild diffuse RCA disease, normal right heart pressures. Medical therapy recommended. Given no improvement in EF with GDMT and h/o LBBB, he was referred to EP. He underwent implantation of BiV ICD CRT-D on 10/20/2021.  He presents today for follow-up accompanied by his wife. Since his last visit he has done well from a cardiac standpoint. He denies dyspnea, edema, weight gain. Denies symptoms concerning for angina. In reviewing his med list, he states he has been taking lisinopril, losartan and Entresto.  He is uncertain for how long he has been taking all three medications, however, at his visit with Dr. Ladona Ridgel in December 2022, of the three, only Sherryll Burger was listed. He denies any swelling, coughing. He is asking when he can use a chainsaw to cut down trees.  Otherwise, he reports feeling well and denies any specific concerns or complaints today.  Home Medications    Current Outpatient Medications  Medication Sig Dispense Refill   acetaminophen (TYLENOL) 500 MG tablet Take 500-1,000  mg by mouth every 6 (six) hours as needed (for pain.).     Ascorbic Acid (VITAMIN C) 1000 MG tablet Take 1,000 mg by mouth in the morning.     aspirin 81 MG EC tablet Take 81 mg by mouth in the morning.     Cholecalciferol (QC VITAMIN D3) 50 MCG (2000 UT) TABS Take 2,000 Units by mouth in the morning.     doxycycline (VIBRAMYCIN) 100 MG capsule Take 1 capsule (100 mg  total) by mouth 2 (two) times daily. 20 capsule 0   empagliflozin (JARDIANCE) 25 MG TABS tablet Take 25 mg by mouth daily.     glimepiride (AMARYL) 4 MG tablet Take 4 mg by mouth 2 (two) times daily.     Glucose Blood (BLOOD GLUCOSE TEST STRIPS) STRP Please dispense based on patient and insurance preference. Use as directed to monitor FSBS 3x daily. Dx: T2458. 200 strip 2   hydrOXYzine (ATARAX) 50 MG tablet Take 50 mg by mouth at bedtime.     magnesium oxide (MAG-OX) 400 MG tablet Take 400 mg by mouth daily.     melatonin 5 MG TABS Take 15 mg by mouth at bedtime as needed (sleep).     metFORMIN (GLUCOPHAGE) 1000 MG tablet TAKE 1 TABLET TWICE A DAY WITH MEALS (Patient taking differently: Take 1,000 mg by mouth 2 (two) times daily with a meal.) 60 tablet 0   metoprolol succinate (TOPROL XL) 50 MG 24 hr tablet Take 1 tablet (50 mg total) by mouth daily. 90 tablet 3   Multiple Vitamin (MULTIVITAMIN) tablet Take 1 tablet by mouth in the morning. MEN'S ONE A DAY 50+     nitroGLYCERIN (NITROSTAT) 0.4 MG SL tablet Place 1 tablet (0.4 mg total) under the tongue every 5 (five) minutes as needed for chest pain. 25 tablet 3   pramipexole (MIRAPEX) 1 MG tablet Take 1 mg by mouth at bedtime.     sacubitril-valsartan (ENTRESTO) 24-26 MG Take 1 tablet by mouth 2 (two) times daily. 180 tablet 3   spironolactone (ALDACTONE) 25 MG tablet Take 0.5 tablets (12.5 mg total) by mouth daily. 45 tablet 3   TRULICITY 1.5 MG/0.5ML SOPN INJECT 1 PEN UNDER THE SKIN ONCE A WEEK (Patient taking differently: Inject 1.5 mg as directed every Thursday.) 6 mL 3   vitamin E 1000 UNIT capsule Take 1,000 Units by mouth in the morning.     zinc gluconate 50 MG tablet Take 50 mg by mouth daily.     atorvastatin (LIPITOR) 80 MG tablet Take 1 tablet (80 mg total) by mouth daily. 90 tablet 3   No current facility-administered medications for this visit.     Review of Systems    He denies chest pain, palpitations, dyspnea, pnd,  orthopnea, n, v, dizziness, syncope, edema, weight gain, or early satiety. All other systems reviewed and are otherwise negative except as noted above.   Physical Exam    VS:  BP (!) 102/50 (BP Location: Left Arm, Patient Position: Sitting, Cuff Size: Normal)    Pulse 72    Ht 6' 3.5" (1.918 m)    Wt 240 lb (108.9 kg)    BMI 29.60 kg/m    GEN: Well nourished, well developed, in no acute distress. HEENT: normal. Neck: Supple, no JVD, carotid bruits, or masses. Cardiac: RRR, no murmurs, rubs, or gallops. No clubbing, cyanosis, edema.  Radials/DP/PT 2+ and equal bilaterally.  Respiratory:  Respirations regular and unlabored, clear to auscultation bilaterally. GI: Soft, nontender, nondistended, BS +  x 4. MS: no deformity or atrophy. Skin: warm and dry, no rash. Neuro:  Strength and sensation are intact. Psych: Normal affect.  Accessory Clinical Findings    ECG personally reviewed by me today - No EKG in office today.  Lab Results  Component Value Date   WBC 10.4 09/24/2021   HGB 14.9 09/24/2021   HCT 44.9 09/24/2021   MCV 90 09/24/2021   PLT 194 09/24/2021   Lab Results  Component Value Date   CREATININE 1.49 (H) 09/24/2021   BUN 26 09/24/2021   NA 138 09/24/2021   K 4.8 09/24/2021   CL 99 09/24/2021   CO2 22 09/24/2021   Lab Results  Component Value Date   ALT 41 07/27/2021   AST 28 07/27/2021   ALKPHOS 63 04/24/2021   BILITOT 0.6 07/27/2021   Lab Results  Component Value Date   CHOL 109 06/02/2021   HDL 30 (L) 06/02/2021   LDLCALC 47 06/02/2021   TRIG 194 (H) 06/02/2021   CHOLHDL 3.6 06/02/2021    Lab Results  Component Value Date   HGBA1C 6.4 (H) 07/27/2021    Assessment & Plan    1. Chronic combined systolic and diastolic heart failure/ICM: Echo in February 2022 showed EF 30%.  Follow-up [facility] showed large severe fixed defect involving inferior and anterior walls with preservation of lateral wall, LVEF 24%. Cath in March 2022 showed diffusely  diseased LAD with positive RFR, nonobstructive LCx stenosis, mild diffuse RCA disease, normal right heart pressures. Medical therapy recommended. Given no improvement in EF with GDMT and h/o LBBB, he was referred to EP. He underwent implantation of BiV ICD CRT-D on 10/20/2021. Euvolemic and well compensated on exam. In reviewing his medications today it appears he has been taking lisinopril, losartan, and Entresto possibly since December 2022 (it appears Losartan and Lisinopril were refilled by [facility] in December 2022). There is no clear documentation on this and it is unclear as to why he has been taking all 3 medications. He denies any cough, wheezing, throat or lip swelling. Will check BMET today. He will confirm whether or not he has been taking his medications when he gets home and call our office with this information.  STOP lisinopril and losartan.  Continue ASA, Lipitor, Entresto, metoprolol, Jardiance, and spironolactone.  I advised him to not engage in any heavy activities such as using a chainsaw to cut down trees until he is cleared to do so by Dr. Ladona Ridgel.  2. CAD:  Cardiac catheterization in March 2022 as above. Medical therapy recommended. Stable with no anginal symptoms. No indication for ischemic evaluation. Continue current medications as above.   3. LBBB: Chronic, no EKG in office today, EKG in 10/2021 showed stable LBBB. Continue current medications as above.   4. Hypertension: BP runs low, asymptomatic, continue current medications as above.   5. OSA: He has not been wearing his CPAP. [facility], [facility].   6. Disposition: F/u with Dr. Ladona Ridgel as scheduled. F/u with Dr. Bjorn Pippin in 3 months.   Joylene Grapes, NP 11/25/2021, 2:45 PM

## 2021-11-26 ENCOUNTER — Telehealth: Payer: Self-pay | Admitting: Cardiology

## 2021-11-26 LAB — BASIC METABOLIC PANEL
BUN/Creatinine Ratio: 23 (ref 10–24)
BUN: 32 mg/dL — ABNORMAL HIGH (ref 8–27)
CO2: 21 mmol/L (ref 20–29)
Calcium: 9.5 mg/dL (ref 8.6–10.2)
Chloride: 100 mmol/L (ref 96–106)
Creatinine, Ser: 1.4 mg/dL — ABNORMAL HIGH (ref 0.76–1.27)
Glucose: 208 mg/dL — ABNORMAL HIGH (ref 70–99)
Potassium: 4.9 mmol/L (ref 3.5–5.2)
Sodium: 137 mmol/L (ref 134–144)
eGFR: 54 mL/min/{1.73_m2} — ABNORMAL LOW (ref >59)

## 2021-11-26 NOTE — Telephone Encounter (Signed)
Patient's wife called stating he is no longer taking atorvastatin. He also is not taking trulicity that was replaced with ozempic.   She also wants to know if he would qualify for an inspire cpap.

## 2021-11-26 NOTE — Telephone Encounter (Signed)
Spoke with spouse on phone who reports that patient has not been taking atorvastatin. She wants to know if patient still needs to take it. I don't see any notation to discontinue it. Also, the [place] physician changed pt from trulicity to ozempic. Also, she is asking about the Inspire device for sleep apnea. Please advise on the atorvastatin and dose.

## 2021-11-27 MED ORDER — ATORVASTATIN CALCIUM 80 MG PO TABS: 80.0000 mg | ORAL_TABLET | Freq: Every day | ORAL | 0 refills | Status: AC

## 2021-11-27 NOTE — Telephone Encounter (Signed)
Returned call pt wife was just verifying pt's medications. She is also asking if we recommend that pt's [place] the inspire device (implant)? Informed that I would forward to provider but should probably ask CPAP provider. Verbalized understanding, she would like to know our recommendations as well.

## 2021-11-27 NOTE — Telephone Encounter (Signed)
He has a history of obstructive CAD and should be on atorvastatin.  Would recommend restarting.  Would recommend discussing with his sleep doctor about Keith Schwartz

## 2021-11-27 NOTE — Telephone Encounter (Signed)
Returned call to patients wife (okay per DPR) advised her of Dr. Campbell Lerner recommendations. Refill for atorvastatin 80mg  once daily sent to patient preferred pharmacy. Advised to call back with any issues. Patients wife verbalized understanding.

## 2021-12-01 NOTE — Telephone Encounter (Signed)
Returned call to pt and informed him of the message from [place], NP, verbalized understanding. He states that he had an appointment with the [facility] "the other day" and was referred to another MD for this.

## 2021-12-10 ENCOUNTER — Encounter: Payer: Self-pay | Admitting: Cardiology

## 2022-01-21 ENCOUNTER — Ambulatory Visit (INDEPENDENT_AMBULATORY_CARE_PROVIDER_SITE_OTHER): Payer: Medicare Other | Admitting: Internal Medicine

## 2022-01-21 ENCOUNTER — Encounter: Payer: Self-pay | Admitting: Internal Medicine

## 2022-01-21 ENCOUNTER — Ambulatory Visit (INDEPENDENT_AMBULATORY_CARE_PROVIDER_SITE_OTHER): Payer: Medicare Other

## 2022-01-21 ENCOUNTER — Telehealth: Payer: Self-pay | Admitting: *Deleted

## 2022-01-21 ENCOUNTER — Other Ambulatory Visit: Payer: Self-pay | Admitting: Cardiology

## 2022-01-21 VITALS — BP 118/70 | HR 72 | Ht 75.5 in | Wt 236.0 lb

## 2022-01-21 DIAGNOSIS — I5022 Chronic systolic (congestive) heart failure: Secondary | ICD-10-CM

## 2022-01-21 DIAGNOSIS — Z95810 Presence of automatic (implantable) cardiac defibrillator: Secondary | ICD-10-CM

## 2022-01-21 DIAGNOSIS — I10 Essential (primary) hypertension: Secondary | ICD-10-CM

## 2022-01-21 LAB — CUP PACEART INCLINIC DEVICE CHECK
Date Time Interrogation Session: 20230406162330
HighPow Impedance: 66 Ohm
Implantable Lead Implant Date: 20230103
Implantable Lead Implant Date: 20230103
Implantable Lead Implant Date: 20230103
Implantable Lead Location: 753858
Implantable Lead Location: 753859
Implantable Lead Location: 753860
Implantable Lead Model: 138
Implantable Lead Model: 3830
Implantable Lead Model: 7841
Implantable Lead Serial Number: 1150746
Implantable Lead Serial Number: 302782
Implantable Pulse Generator Implant Date: 20230103
Lead Channel Impedance Value: 500 Ohm
Lead Channel Impedance Value: 670 Ohm
Lead Channel Impedance Value: 717 Ohm
Lead Channel Pacing Threshold Amplitude: 0.7 V
Lead Channel Pacing Threshold Amplitude: 1.1 V
Lead Channel Pacing Threshold Amplitude: 1.3 V
Lead Channel Pacing Threshold Pulse Width: 0.4 ms
Lead Channel Pacing Threshold Pulse Width: 0.4 ms
Lead Channel Pacing Threshold Pulse Width: 0.7 ms
Lead Channel Sensing Intrinsic Amplitude: 22.1 mV
Lead Channel Sensing Intrinsic Amplitude: 3.4 mV
Lead Channel Sensing Intrinsic Amplitude: 5.6 mV
Lead Channel Setting Pacing Amplitude: 2 V
Lead Channel Setting Pacing Amplitude: 2.5 V
Lead Channel Setting Pacing Amplitude: 2.5 V
Lead Channel Setting Pacing Pulse Width: 0.4 ms
Lead Channel Setting Pacing Pulse Width: 0.7 ms
Lead Channel Setting Sensing Sensitivity: 0.6 mV
Lead Channel Setting Sensing Sensitivity: 1 mV
Pulse Gen Serial Number: 151244

## 2022-01-21 LAB — CUP PACEART REMOTE DEVICE CHECK
Battery Remaining Longevity: 102 mo
Battery Remaining Percentage: 100 %
Brady Statistic RA Percent Paced: 1 %
Brady Statistic RV Percent Paced: 99 %
Date Time Interrogation Session: 20230406043200
HighPow Impedance: 63 Ohm
Implantable Lead Implant Date: 20230103
Implantable Lead Implant Date: 20230103
Implantable Lead Implant Date: 20230103
Implantable Lead Location: 753858
Implantable Lead Location: 753859
Implantable Lead Location: 753860
Implantable Lead Model: 138
Implantable Lead Model: 3830
Implantable Lead Model: 7841
Implantable Lead Serial Number: 1150746
Implantable Lead Serial Number: 302782
Implantable Pulse Generator Implant Date: 20230103
Lead Channel Impedance Value: 479 Ohm
Lead Channel Impedance Value: 659 Ohm
Lead Channel Impedance Value: 725 Ohm
Lead Channel Setting Pacing Amplitude: 3.5 V
Lead Channel Setting Pacing Amplitude: 3.5 V
Lead Channel Setting Pacing Amplitude: 3.5 V
Lead Channel Setting Pacing Pulse Width: 0.4 ms
Lead Channel Setting Pacing Pulse Width: 0.4 ms
Lead Channel Setting Sensing Sensitivity: 0.6 mV
Lead Channel Setting Sensing Sensitivity: 1 mV
Pulse Gen Serial Number: 151244

## 2022-01-21 NOTE — Chronic Care Management (AMB) (Signed)
?  Care Management  ? ?Outreach Note ? ?01/21/2022 ?Name: Keith Schwartz MRN: [id number] DOB: July 24, 1951 ? ?Referred by: Donita Brooks, MD ?Reason for referral : Care Coordination (Initial outreach to schedule with RNCM ) ? ? ?An unsuccessful telephone outreach was attempted today. The patient was referred to the case management team for assistance with care management and care coordination.  ? ?Follow Up Plan:  ?The care management team will reach out to the patient again over the next 14 days.  ?If patient returns call to provider office, please advise to call Embedded Care Management Care Guide Misty Stanley* at [phone].* ? ?Gwenevere Ghazi  ?Care Guide, Embedded Care Coordination ?[place]  Care Management  ?Direct Dial: [phone] ? ?

## 2022-01-21 NOTE — Patient Instructions (Signed)
Medication Instructions:  ?Your physician recommends that you continue on your current medications as directed. Please refer to the Current Medication list given to you today. ? ?Labwork: ?None ordered. ? ?Testing/Procedures: ?None ordered. ? ?Follow-Up: ?Your physician wants you to follow-up in: one year with Cristopher Peru, MD or one of the following Advanced Practice Providers on your designated Care Team:   ?Tommye Standard, PA-C ?Legrand Como "Jonni Sanger" [place], PA-C ? ?Remote monitoring is used to monitor your ICD from home. This monitoring reduces the number of office visits required to check your device to one time per year. It allows [place] to keep an eye on the functioning of your device to ensure it is working properly. You are scheduled for a device check from home on 04/22/2022. You may send your transmission at any time that day. If you have a wireless device, the transmission will be sent automatically. After your physician reviews your transmission, you will receive a postcard with your next transmission date. ? ?Any Other Special Instructions Will Be Listed Below (If Applicable). ? ?If you need a refill on your cardiac medications before your next appointment, please call your pharmacy.  ? ? ? ? ?

## 2022-01-21 NOTE — Progress Notes (Signed)
? ? ? ? ?HPI ?[name] returns today for followup. He is a pleasant 71 yo man with a h/o CHF, LBBB and CRI. He has undergone biv ICD insertion about 3 months ago. He has done well in the interim. He denies chest pain or sob. He c/o pain in both knees and is thought to have prosthetic knee joint loosening.  ?Allergies  ?Allergen Reactions  ? Glipizide Hives and Rash  ? Penicillins Rash  ?  Add as: Penicillin G/ Childhood  ? ? ? ?Current Outpatient Medications  ?Medication Sig Dispense Refill  ? acetaminophen (TYLENOL) 500 MG tablet Take 500-1,000 mg by mouth every 6 (six) hours as needed (for pain.).    ? Ascorbic Acid (VITAMIN C) 1000 MG tablet Take 1,000 mg by mouth in the morning.    ? aspirin 81 MG EC tablet Take 81 mg by mouth in the morning.    ? atorvastatin (LIPITOR) 80 MG tablet Take 1 tablet (80 mg total) by mouth daily. 30 tablet 0  ? Cholecalciferol (QC VITAMIN D3) 50 MCG (2000 UT) TABS Take 2,000 Units by mouth in the morning.    ? doxycycline (VIBRAMYCIN) 100 MG capsule Take 1 capsule (100 mg total) by mouth 2 (two) times daily. 20 capsule 0  ? empagliflozin (JARDIANCE) 25 MG TABS tablet Take 12.5 mg by mouth daily.    ? glimepiride (AMARYL) 4 MG tablet Take 4 mg by mouth 2 (two) times daily.    ? Glucose Blood (BLOOD GLUCOSE TEST STRIPS) STRP Please dispense based on patient and insurance preference. Use as directed to monitor FSBS 3x daily. Dx: I3382. 200 strip 2  ? hydrOXYzine (ATARAX) 50 MG tablet Take 50 mg by mouth at bedtime.    ? magnesium oxide (MAG-OX) 400 MG tablet Take 400 mg by mouth daily.    ? melatonin 5 MG TABS Take 15 mg by mouth at bedtime as needed (sleep).    ? metFORMIN (GLUCOPHAGE) 1000 MG tablet TAKE 1 TABLET TWICE A DAY WITH MEALS 60 tablet 0  ? metoprolol succinate (TOPROL XL) 50 MG 24 hr tablet Take 1 tablet (50 mg total) by mouth daily. 90 tablet 3  ? Multiple Vitamin (MULTIVITAMIN) tablet Take 1 tablet by mouth in the morning. MEN'S ONE A DAY 50+    ? nitroGLYCERIN  (NITROSTAT) 0.4 MG SL tablet Place 1 tablet (0.4 mg total) under the tongue every 5 (five) minutes as needed for chest pain. 25 tablet 3  ? pramipexole (MIRAPEX) 1 MG tablet Take 1 mg by mouth at bedtime.    ? sacubitril-valsartan (ENTRESTO) 24-26 MG Take 1 tablet by mouth 2 (two) times daily. 180 tablet 3  ? spironolactone (ALDACTONE) 25 MG tablet Take 0.5 tablets (12.5 mg total) by mouth daily. 45 tablet 3  ? TRULICITY 1.5 MG/0.5ML SOPN INJECT 1 PEN UNDER THE SKIN ONCE A WEEK 6 mL 3  ? vitamin E 1000 UNIT capsule Take 1,000 Units by mouth in the morning.    ? zinc gluconate 50 MG tablet Take 50 mg by mouth daily.    ? ?No current facility-administered medications for this visit.  ? ? ? ?Past Medical History:  ?Diagnosis Date  ? Arthritis   ? CHF (congestive heart failure) (HCC)   ? Coronary artery disease   ? Diabetes mellitus without complication (HCC) 10/18/2000  ? History of kidney stones   ? Hypertension 10/18/2000  ? Ischemic cardiomyopathy   ? Pneumonia   ? walking  ? Sleep apnea   ?  uses cpap at times  ? ? ?ROS: ? ? All systems reviewed and negative except as noted in the HPI. ? ? ?Past Surgical History:  ?Procedure Laterality Date  ? APPENDECTOMY  10/18/1968  ? BIV ICD INSERTION CRT-D N/A 10/20/2021  ? Procedure: BIV ICD INSERTION CRT-D;  Surgeon: Marinus Mawaylor, Benetta Maclaren W, MD;  [facility];  Service: Cardiovascular;  Laterality: N/A;  ? HERNIA REPAIR    ? INTRAVASCULAR PRESSURE WIRE/FFR STUDY N/A 12/22/2020  ? Procedure: INTRAVASCULAR PRESSURE WIRE/FFR STUDY;  Surgeon: Tonny Bollmanooper, Michael, MD;  [facility];  Service: Cardiovascular;  Laterality: N/A;  ? JOINT REPLACEMENT  10/18/2010  ? rt knee  ? RIGHT/LEFT HEART CATH AND CORONARY ANGIOGRAPHY N/A 12/22/2020  ? Procedure: RIGHT/LEFT HEART CATH AND CORONARY ANGIOGRAPHY;  Surgeon: Tonny Bollmanooper, Michael, MD;  [facility];  Service: Cardiovascular;  Laterality: N/A;  ? VASECTOMY  10/18/1984  ? ? ? ?Family History  ?Problem Relation  Age of Onset  ? Arthritis Mother   ? Asthma Mother   ? Hearing loss Mother   ? Vision loss Mother   ? Heart disease Father   ? Hyperlipidemia Father   ? Hypertension Father   ? Early death Sister   ? ? ? ?Social History  ? ?Socioeconomic History  ? Marital status: Married  ?  Spouse name: Not on file  ? Number of children: Not on file  ? Years of education: Not on file  ? Highest education level: Not on file  ?Occupational History  ? Not on file  ?Tobacco Use  ? Smoking status: Never  ? Smokeless tobacco: Never  ?Vaping Use  ? Vaping Use: Never used  ?Substance and Sexual Activity  ? Alcohol use: Not Currently  ?  Alcohol/week: 1.0 standard drink  ?  Types: 1 Cans of beer per week  ? Drug use: Never  ? Sexual activity: Not Currently  ?  Birth control/protection: Surgical  ?Other Topics Concern  ? Not on file  ?Social History Narrative  ? [place].  ? Retired Visual merchandiserfarmer.  ? Former Chief Technology Officernaval officer, Engineer, siteschool teacher, Printmakersurveyor   ? ?Social Determinants of Health  ? ?Financial Resource Strain: Not on file  ?Food Insecurity: Not on file  ?Transportation Needs: Not on file  ?Physical Activity: Not on file  ?Stress: Not on file  ?Social Connections: Not on file  ?Intimate Partner Violence: Not on file  ? ? ? ?BP 118/70   Pulse 72   Ht 6' 3.5" (1.918 m)   Wt 236 lb (107 kg)   SpO2 95%   BMI 29.11 kg/m?  ? ?Physical Exam: ? ?Well appearing NAD ?HEENT: Unremarkable ?Neck:  No JVD, no thyromegally ?Lymphatics:  No adenopathy ?Back:  No CVA tenderness ?Lungs:  Clear with no wheezes ?HEART:  Regular rate rhythm, no murmurs, no rubs, no clicks ?Abd:  soft, positive bowel sounds, no organomegally, no rebound, no guarding ?Ext:  2 plus pulses, no edema, no cyanosis, no clubbing ?Skin:  No rashes no nodules ?Neuro:  CN II through XII intact, motor grossly intact ? ?EKG - NSR with P synchronous ventricular pacing ? ?DEVICE  ?Normal device function.  See PaceArt for details.  ? ?Assess/Plan:  ?ICD - his Biv ICD is  working normally. We will recheck in several months. ?CAD - he denies anginal symptoms. Continue current meds. ?Chronic systolic heart failure - his symptoms are class 2. He will continue his GDMT. ?Preop - he  has joint laxity and is pending a visit to Dr. Charlann Boxer. If surgery is required he may proceed with an acceptable risk.  ? ?Sharlot Gowda Bunnie Rehberg,MD ?

## 2022-02-01 NOTE — Chronic Care Management (AMB) (Signed)
?  Care Management  ? ?Note ? ?02/01/2022 ?Name: Keith Schwartz MRN: [id number] DOB: 1950-11-08 ? ?Keith Schwartz is a 71 y.o. year old male who is a primary care patient of Donita Brooks, MD. I reached out to [name] by phone today offer care coordination services.  ? ?[name] was given information about care management services today including:  ?Care management services include personalized support from designated clinical staff supervised by his physician, including individualized plan of care and coordination with other care providers ?24/7 contact phone numbers for assistance for urgent and routine care needs. ?The patient may stop care management services at any time by phone call to the office staff. ? ?Patient agreed to services and verbal consent obtained.  ? ?Follow up plan: ?Telephone appointment with care management team member scheduled for:02/08/22 ? ?Gwenevere Ghazi  ?Care Guide, Embedded Care Coordination ?[place]  Care Management  ?Direct Dial: [phone] ? ?

## 2022-02-05 ENCOUNTER — Other Ambulatory Visit (HOSPITAL_COMMUNITY): Payer: Self-pay | Admitting: Orthopedic Surgery

## 2022-02-05 ENCOUNTER — Other Ambulatory Visit: Payer: Self-pay | Admitting: Orthopedic Surgery

## 2022-02-05 DIAGNOSIS — Z96651 Presence of right artificial knee joint: Secondary | ICD-10-CM

## 2022-02-05 NOTE — Progress Notes (Signed)
Remote ICD transmission.   

## 2022-02-08 ENCOUNTER — Telehealth: Payer: Medicare Other

## 2022-02-08 ENCOUNTER — Encounter: Payer: Self-pay | Admitting: Cardiology

## 2022-02-08 ENCOUNTER — Other Ambulatory Visit: Payer: Self-pay | Admitting: Cardiology

## 2022-02-12 ENCOUNTER — Ambulatory Visit (INDEPENDENT_AMBULATORY_CARE_PROVIDER_SITE_OTHER): Payer: Medicare Other | Admitting: *Deleted

## 2022-02-12 DIAGNOSIS — E118 Type 2 diabetes mellitus with unspecified complications: Secondary | ICD-10-CM

## 2022-02-12 DIAGNOSIS — I5022 Chronic systolic (congestive) heart failure: Secondary | ICD-10-CM

## 2022-02-12 NOTE — Patient Instructions (Signed)
Visit Information ? ?Thank you for taking time to visit with me today. Please don't hesitate to contact me if I can be of assistance to you before our next scheduled telephone appointment. ? ?Following are the goals we discussed today:  ?Take medications as prescribed   ?Attend all scheduled provider appointments ?Call pharmacy for medication refills 3-7 days in advance of running out of medications ?Attend church or other social activities ?Perform all self care activities independently  ?Perform IADL's (shopping, preparing meals, housekeeping, managing finances) independently ?Call provider office for new concerns or questions  ?call office if I gain more than 2 pounds in one day or 5 pounds in one week ?keep legs up while sitting ?use salt in moderation ?watch for swelling in feet, ankles and legs every day ?weigh myself daily ?follow rescue plan if symptoms flare-up ?eat more whole grains, fruits and vegetables, lean meats and healthy fats ?check blood sugar at prescribed times: once daily ?check feet daily for cuts, sores or redness ?enter blood sugar readings and medication or insulin into daily log ?take the blood sugar log to all doctor visits ?take the blood sugar meter to all doctor visits ?limit fast food meals to no more than 1 per week ?read food labels for fat, fiber, carbohydrates and portion size ?wash and dry feet carefully every day ?Look over education sent via My Chart- low sodium diet, heart failure action plan and hypoglycemia ?Follow low sodium diet- read labels ? ?Our next appointment is by telephone on 04/16/22 at 130 pm ? ?Please call the care guide team at [phone] if you need to cancel or reschedule your appointment.  ? ?If you are experiencing a Mental Health or Behavioral Health Crisis or need someone to talk to, please call the Suicide and Crisis Lifeline: 988 ?call the [place] National Suicide Prevention Lifeline: 5076722001 or TTY: 916-163-0350 TTY (743)227-1361) to talk to a  trained counselor ?call 1-800-273-TALK (toll free, 24 hour hotline) ?go to [facility] [address], [place] [phone]) ?call 911  ? ?Following is a copy of your full plan of care:  ?Care Plan : RN Care Manager Plan of Care  ?Updates made by Audrie Gallus, RN since 02/12/2022 12:00 AM  ?  ? ?Problem: No plan of care established for management of chronic disease state  (CHF, DM2)   ?Priority: High  ?  ? ?Long-Range Goal: Patient-Specific Goal   ?Start Date: 02/12/2022  ?Expected End Date: 08/11/2022  ?Priority: High  ?Note:   ?Current Barriers:  ?Knowledge Deficits related to plan of care for management of CHF and DMII  ?Patient reports he lives with spouse, is independent with all aspects of his care, continues to drive, has all medications except ozempic which is being delivered from [place] in 1-2 days.  ?Patient reports he checks CBG once daily with ranges in 130's at present and usually around 115 when taking ozempic. ?Patient reports he weighs daily with most recent weight 235 pounds ?Patient has upcoming right total knee replacement on 03/16/22 ? ?RNCM Clinical Goal(s):  ?Patient will verbalize understanding of plan for management of CHF and DMII as evidenced by patient report, review of EHR and  through collaboration with RN Care manager, provider, and care team.  ? ?Interventions: ?1:1 collaboration with primary care provider regarding development and update of comprehensive plan of care as evidenced by provider attestation and co-signature ?Inter-disciplinary care team collaboration (see longitudinal plan of care) ?Evaluation of current treatment plan related to  self management and patient's adherence to plan as established by provider ? ? ?Heart Failure Interventions:  (Status:  New goal. and Goal on track:  Yes.) Long Term Goal ?Basic overview and discussion of pathophysiology of Heart Failure reviewed ?Provided education on low sodium diet ?Reviewed Heart Failure  Action Plan in depth and provided written copy ?Assessed need for readable accurate scales in home ?Provided education about placing scale on hard, flat surface ?Advised patient to weigh each morning after emptying bladder ?Discussed importance of daily weight and advised patient to weigh and record daily ?Reviewed role of diuretics in prevention of fluid overload and management of heart failure; ?Screening for signs and symptoms of depression related to chronic disease state  ?Assessed social determinant of health barriers  ?Education via My Chart- low sodium diet, heart failure action plan ? ?Diabetes Interventions:  (Status:  New goal. and Goal on track:  Yes.) Long Term Goal ?Assessed patient's understanding of A1c goal: <7% ?Provided education to patient about basic DM disease process ?Reviewed medications with patient and discussed importance of medication adherence ?Discussed plans with patient for ongoing care management follow up and provided patient with direct contact information for care management team ?Review of patient status, including review of consultants reports, relevant laboratory and other test results, and medications completed ?Reviewed carbohydrate modified diet and nutritious food choices ?Education via My Chart- hypoglycemia ?Lab Results  ?Component Value Date  ? HGBA1C 6.4 (H) 07/27/2021  ?  ? ?Patient Goals/Self-Care Activities: ?Take medications as prescribed   ?Attend all scheduled provider appointments ?Call pharmacy for medication refills 3-7 days in advance of running out of medications ?Attend church or other social activities ?Perform all self care activities independently  ?Perform IADL's (shopping, preparing meals, housekeeping, managing finances) independently ?Call provider office for new concerns or questions  ?call office if I gain more than 2 pounds in one day or 5 pounds in one week ?keep legs up while sitting ?use salt in moderation ?watch for swelling in feet, ankles and  legs every day ?weigh myself daily ?follow rescue plan if symptoms flare-up ?eat more whole grains, fruits and vegetables, lean meats and healthy fats ?check blood sugar at prescribed times: once daily ?check feet daily for cuts, sores or redness ?enter blood sugar readings and medication or insulin into daily log ?take the blood sugar log to all doctor visits ?take the blood sugar meter to all doctor visits ?limit fast food meals to no more than 1 per week ?read food labels for fat, fiber, carbohydrates and portion size ?wash and dry feet carefully every day ?Look over education sent via My Chart- low sodium diet, heart failure action plan and hypoglycemia ?Follow low sodium diet- read labels ? ? ?  ? ? ?Keith Schwartz was given information about Care Management services by the embedded care coordination team including:  ?Care Management services include personalized support from designated clinical staff supervised by his physician, including individualized plan of care and coordination with other care providers ?24/7 contact phone numbers for assistance for urgent and routine care needs. ?The patient may stop CCM services at any time (effective at the end of the month) by phone call to the office staff. ? ?Patient agreed to services and verbal consent obtained.  ? ?Patient verbalizes understanding of instructions and care plan provided today and agrees to view in MyChart. Active MyChart status confirmed with patient.   ? ?Telephone follow up appointment with care management team member scheduled for: 04/16/22 ? ?Keith ShowsJulie Travas Schwartz  RNC, BSN ?RN Case Manager ?[facility] ?[phone] ? ? ?  ?

## 2022-02-12 NOTE — Chronic Care Management (AMB) (Signed)
? Care Management ?  ? RN Visit Note ? ?02/12/2022 ?Name: Keith Schwartz MRN: [id number] DOB: 1951-06-04 ? ?Subjective: ?Keith Schwartz is a 71 y.o. year old male who is a primary care patient of Pickard, Priscille Heidelberg, MD. The care management team was consulted for assistance with disease management and care coordination needs.   ? ?Engaged with patient by telephone for initial visit in response to provider referral for case management and/or care coordination services.  ? ?Consent to Services:  ? Keith Schwartz was given information about Care Management services today including:  ?Care Management services includes personalized support from designated clinical staff supervised by his physician, including individualized plan of care and coordination with other care providers ?24/7 contact phone numbers for assistance for urgent and routine care needs. ?The patient may stop case management services at any time by phone call to the office staff. ? ?Patient agreed to services and consent obtained.  ? ?Assessment: Review of patient past medical history, allergies, medications, health status, including review of consultants reports, laboratory and other test data, was performed as part of comprehensive evaluation and provision of chronic care management services.  ? ?SDOH (Social Determinants of Health) assessments and interventions performed:  ?SDOH Interventions   ? ?Flowsheet Row Most Recent Value  ?SDOH Interventions   ?Food Insecurity Interventions Intervention Not Indicated  ?Transportation Interventions Intervention Not Indicated  ? ?  ?  ? ?Care Plan ? ?Allergies  ?Allergen Reactions  ? Glipizide Hives and Rash  ? Penicillins Rash  ?  Add as: Penicillin G/ Childhood  ? ? ?Outpatient Encounter Medications as of 02/12/2022  ?Medication Sig  ? acetaminophen (TYLENOL) 500 MG tablet Take 500-1,000 mg by mouth every 6 (six) hours as needed (for pain.).  ? Ascorbic Acid (VITAMIN C) 1000 MG tablet Take 1,000 mg by mouth in  the morning.  ? aspirin 81 MG EC tablet Take 81 mg by mouth in the morning.  ? atorvastatin (LIPITOR) 80 MG tablet Take 1 tablet (80 mg total) by mouth daily.  ? Cholecalciferol (QC VITAMIN D3) 50 MCG (2000 UT) TABS Take 2,000 Units by mouth in the morning.  ? empagliflozin (JARDIANCE) 25 MG TABS tablet Take 12.5 mg by mouth daily.  ? glimepiride (AMARYL) 4 MG tablet Take 4 mg by mouth 2 (two) times daily.  ? Glucose Blood (BLOOD GLUCOSE TEST STRIPS) STRP Please dispense based on patient and insurance preference. Use as directed to monitor FSBS 3x daily. Dx: O0370.  ? hydrOXYzine (ATARAX) 50 MG tablet Take 50 mg by mouth at bedtime.  ? magnesium oxide (MAG-OX) 400 MG tablet Take 400 mg by mouth daily.  ? melatonin 5 MG TABS Take 15 mg by mouth at bedtime as needed (sleep).  ? metFORMIN (GLUCOPHAGE) 1000 MG tablet TAKE 1 TABLET TWICE A DAY WITH MEALS  ? metoprolol succinate (TOPROL XL) 50 MG 24 hr tablet Take 1 tablet (50 mg total) by mouth daily.  ? Multiple Vitamin (MULTIVITAMIN) tablet Take 1 tablet by mouth in the morning. MEN'S ONE A DAY 50+  ? nitroGLYCERIN (NITROSTAT) 0.4 MG SL tablet Place 1 tablet (0.4 mg total) under the tongue every 5 (five) minutes as needed for chest pain.  ? pramipexole (MIRAPEX) 1 MG tablet Take 1 mg by mouth at bedtime.  ? sacubitril-valsartan (ENTRESTO) 24-26 MG Take 1 tablet by mouth 2 (two) times daily.  ? vitamin E 1000 UNIT capsule Take 1,000 Units by mouth in the morning.  ? zinc gluconate 50 MG  tablet Take 50 mg by mouth daily.  ? doxycycline (VIBRAMYCIN) 100 MG capsule Take 1 capsule (100 mg total) by mouth 2 (two) times daily. (Patient not taking: Reported on 02/12/2022)  ? spironolactone (ALDACTONE) 25 MG tablet Take 0.5 tablets (12.5 mg total) by mouth daily.  ? TRULICITY 1.5 MG/0.5ML SOPN INJECT 1 PEN UNDER THE SKIN ONCE A WEEK  ? ?No facility-administered encounter medications on file as of 02/12/2022.  ? ? ?Patient Active Problem List  ? Diagnosis Date Noted  ?  Biventricular ICD (implantable cardioverter-defibrillator) in place 01/21/2022  ? Chronic systolic heart failure (HCC) 02/05/2021  ? Coronary artery disease involving native coronary artery with angina pectoris (HCC) 12/22/2020  ? Gout 05/16/2017  ? Mixed hyperlipidemia 10/27/2015  ? Erectile dysfunction 10/27/2015  ? Testosterone deficiency 10/27/2015  ? Type 2 or unspecified type diabetes mellitus   ? Hypertension   ? ? ?Conditions to be addressed/monitored: CHF and DMII ? ?Care Plan : RN Care Manager Plan of Care  ?Updates made by Audrie Gallus, RN since 02/12/2022 12:00 AM  ?  ? ?Problem: No plan of care established for management of chronic disease state  (CHF, DM2)   ?Priority: High  ?  ? ?Long-Range Goal: Patient-Specific Goal   ?Start Date: 02/12/2022  ?Expected End Date: 08/11/2022  ?Priority: High  ?Note:   ?Current Barriers:  ?Knowledge Deficits related to plan of care for management of CHF and DMII  ?Patient reports he lives with spouse, is independent with all aspects of his care, continues to drive, has all medications except ozempic which is being delivered from [place] in 1-2 days.  ?Patient reports he checks CBG once daily with ranges in 130's at present and usually around 115 when taking ozempic. ?Patient reports he weighs daily with most recent weight 235 pounds ?Patient has upcoming right total knee replacement on 03/16/22 ? ?RNCM Clinical Goal(s):  ?Patient will verbalize understanding of plan for management of CHF and DMII as evidenced by patient report, review of EHR and  through collaboration with RN Care manager, provider, and care team.  ? ?Interventions: ?1:1 collaboration with primary care provider regarding development and update of comprehensive plan of care as evidenced by provider attestation and co-signature ?Inter-disciplinary care team collaboration (see longitudinal plan of care) ?Evaluation of current treatment plan related to  self management and patient's adherence to plan as  established by provider ? ? ?Heart Failure Interventions:  (Status:  New goal. and Goal on track:  Yes.) Long Term Goal ?Basic overview and discussion of pathophysiology of Heart Failure reviewed ?Provided education on low sodium diet ?Reviewed Heart Failure Action Plan in depth and provided written copy ?Assessed need for readable accurate scales in home ?Provided education about placing scale on hard, flat surface ?Advised patient to weigh each morning after emptying bladder ?Discussed importance of daily weight and advised patient to weigh and record daily ?Reviewed role of diuretics in prevention of fluid overload and management of heart failure; ?Screening for signs and symptoms of depression related to chronic disease state  ?Assessed social determinant of health barriers  ?Education via My Chart- low sodium diet, heart failure action plan ? ?Diabetes Interventions:  (Status:  New goal. and Goal on track:  Yes.) Long Term Goal ?Assessed patient's understanding of A1c goal: <7% ?Provided education to patient about basic DM disease process ?Reviewed medications with patient and discussed importance of medication adherence ?Discussed plans with patient for ongoing care management follow up and provided patient with direct contact  information for care management team ?Review of patient status, including review of consultants reports, relevant laboratory and other test results, and medications completed ?Reviewed carbohydrate modified diet and nutritious food choices ?Education via My Chart- hypoglycemia ?Lab Results  ?Component Value Date  ? HGBA1C 6.4 (H) 07/27/2021  ?  ? ?Patient Goals/Self-Care Activities: ?Take medications as prescribed   ?Attend all scheduled provider appointments ?Call pharmacy for medication refills 3-7 days in advance of running out of medications ?Attend church or other social activities ?Perform all self care activities independently  ?Perform IADL's (shopping, preparing meals,  housekeeping, managing finances) independently ?Call provider office for new concerns or questions  ?call office if I gain more than 2 pounds in one day or 5 pounds in one week ?keep legs up while sitting ?use salt in moderati

## 2022-02-14 DIAGNOSIS — E118 Type 2 diabetes mellitus with unspecified complications: Secondary | ICD-10-CM

## 2022-02-14 DIAGNOSIS — I5022 Chronic systolic (congestive) heart failure: Secondary | ICD-10-CM

## 2022-02-25 ENCOUNTER — Ambulatory Visit: Payer: Medicare Other | Admitting: Cardiology

## 2022-03-05 ENCOUNTER — Encounter: Payer: Self-pay | Admitting: Internal Medicine

## 2022-03-05 NOTE — Progress Notes (Signed)
PERIOPERATIVE PRESCRIPTION FOR IMPLANTED CARDIAC DEVICE PROGRAMMING  Patient Information: Name:  Keith Schwartz  DOB:  1950-12-15  MRN:  [id number]    Planned Procedure:  Left total knee arthroplasty  Surgeon:  Dr. Durene Romans  Date of Procedure:  03/16/22  Cautery will be used.  Position during surgery:  unknown   Please send documentation back to:  [facility] (Fax # [phone])   Device Information:  Clinic EP Physician:  Lewayne Bunting, MD   Device Type:  Defibrillator Manufacturer and Phone #:  [employer]: [phone] Pacemaker Dependent?:  No. Date of Last Device Check:  01/21/2022 Normal Device Function?:  Yes.    Electrophysiologist's Recommendations:  Have magnet available. Provide continuous ECG monitoring when magnet is used or reprogramming is to be performed.  Procedure should not interfere with device function.  No device programming or magnet placement needed.  Per Device Clinic Standing Orders, Lenor Coffin, RN  1:00 PM 03/05/2022

## 2022-03-05 NOTE — Progress Notes (Signed)
Please repeat a1c at pre-op visit on 5/22.  Rosalene Billings, PA-C Orthopedic Surgery [facility] [place] [phone]

## 2022-03-05 NOTE — Progress Notes (Addendum)
COVID Vaccine Completed: no  Date of COVID positive in last 90 days: no  PCP - [facility], Dr. Toney Reil Cardiologist - Oswaldo Milian, MD  Chest x-ray - 10/20/21 Epic EKG - 01/21/22 Epic Stress Test - 12/09/20 Epic ECHO - 05/19/21 Epic  Cardiac Cath - 12/22/20 Epic Pacemaker/ICD device last checked:01/21/22 Epic Spinal Cord Stimulator: n/a  Bowel Prep - no  Sleep Study - yes, positive CPAP - no  Fasting Blood Sugar - 80-120 Checks Blood Sugar 1 times a day  Blood Thinner Instructions: Aspirin Instructions: ASA 81, hold 5 days before Last Dose:  Activity level: Can go up a flight of stairs and perform activities of daily living without stopping and without symptoms of chest pain or shortness of breath. Difficulty with stairs due to stairs.     Anesthesia review: HTN, CAD, CHF, DM2, ICD, A1C 7.6  Patient denies shortness of breath, fever, cough and chest pain at PAT appointment   Patient verbalized understanding of instructions that were given to them at the PAT appointment. Patient was also instructed that they will need to review over the PAT instructions again at home before surgery.

## 2022-03-05 NOTE — Patient Instructions (Addendum)
DUE TO COVID-19 ONLY TWO VISITORS  (aged 71 and older)  ARE ALLOWED TO COME WITH YOU AND STAY IN THE WAITING ROOM ONLY DURING PRE OP AND PROCEDURE.   **NO VISITORS ARE ALLOWED IN THE SHORT STAY AREA OR RECOVERY ROOM!!**  IF YOU WILL BE ADMITTED INTO THE HOSPITAL YOU ARE ALLOWED ONLY FOUR SUPPORT PEOPLE DURING VISITATION HOURS ONLY (7 AM -8PM)   The support person(s) must pass our screening, gel in and out, and wear a mask at all times, including in the patient's room. Patients must also wear a mask when staff or their support person are in the room. Visitors GUEST BADGE MUST BE WORN VISIBLY  One adult visitor may remain with you overnight and MUST be in the room by 8 P.M.     Your procedure is scheduled on: 03/16/22   Report to [facility] Main Entrance    Report to admitting at 10:10 AM   Call this number if you have problems the morning of surgery [phone]   Do not eat food :After Midnight.   After Midnight you may have the following liquids until 9:55 AM DAY OF SURGERY  Water Black Coffee (sugar ok, NO MILK/CREAM OR CREAMERS)  Tea (sugar ok, NO MILK/CREAM OR CREAMERS) regular and decaf                             Plain Jell-O (NO RED)                                           Fruit ices (not with fruit pulp, NO RED)                                     Popsicles (NO RED)                                                                  Juice: apple, WHITE grape, WHITE cranberry Sports drinks like Gatorade (NO RED) Clear broth(vegetable,chicken,beef)   The day of surgery:  Drink ONE (1) Pre-Surgery G2 at 9:55 AM the morning of surgery. Drink in one sitting. Do not sip.  This drink was given to you during your hospital  pre-op appointment visit. Nothing else to drink after completing the  Pre-Surgery G2.          If you have questions, please contact your surgeon's office.   FOLLOW BOWEL PREP AND ANY ADDITIONAL PRE OP INSTRUCTIONS YOU RECEIVED FROM YOUR SURGEON'S  OFFICE!!!     Oral Hygiene is also important to reduce your risk of infection.                                    Remember - BRUSH YOUR TEETH THE MORNING OF SURGERY WITH YOUR REGULAR TOOTHPASTE   Take these medicines the morning of surgery with A SIP OF WATER: Tylenol, Atorvastatin, Metoprolol.   How to Manage Your Diabetes Before and After Surgery  Why is  it important to control my blood sugar before and after surgery? Improving blood sugar levels before and after surgery helps healing and can limit problems. A way of improving blood sugar control is eating a healthy diet by:  Eating less sugar and carbohydrates  Increasing activity/exercise  Talking with your doctor about reaching your blood sugar goals High blood sugars (greater than 180 mg/dL) can raise your risk of infections and slow your recovery, so you will need to focus on controlling your diabetes during the weeks before surgery. Make sure that the doctor who takes care of your diabetes knows about your planned surgery including the date and location.  How do I manage my blood sugar before surgery? Check your blood sugar at least 4 times a day, starting 2 days before surgery, to make sure that the level is not too high or low. Check your blood sugar the morning of your surgery when you wake up and every 2 hours until you get to the Short Stay unit. If your blood sugar is less than 70 mg/dL, you will need to treat for low blood sugar: Do not take insulin. Treat a low blood sugar (less than 70 mg/dL) with  cup of clear juice (cranberry or apple), 4 glucose tablets, OR glucose gel. Recheck blood sugar in 15 minutes after treatment (to make sure it is greater than 70 mg/dL). If your blood sugar is not greater than 70 mg/dL on recheck, call [phone] for further instructions. Report your blood sugar to the short stay nurse when you get to Short Stay.  If you are admitted to the hospital after surgery: Your blood sugar will be  checked by the staff and you will probably be given insulin after surgery (instead of oral diabetes medicines) to make sure you have good blood sugar levels. The goal for blood sugar control after surgery is 80-180 mg/dL.   WHAT DO I DO ABOUT MY DIABETES MEDICATION?  Do not take oral diabetes medicines (pills) the morning of surgery.  THE DAY BEFORE SURGERY, take Metformin as prescribed. Do not take Jardiance. Take morning dose of Glimperide, do not take evening dose.    THE MORNING OF SURGERY, do not take Metformin, Jardiance, or Glimperide.  The day of surgery, do not take other diabetes injectables, including Byetta (exenatide), Bydureon (exenatide ER), Victoza (liraglutide), or Trulicity (dulaglutide).   Reviewed and Endorsed by [facility] Patient Education Committee, August 2015   DO NOT TAKE ANY ORAL DIABETIC MEDICATIONS DAY OF YOUR SURGERY                              You may not have any metal on your body including jewelry, and body piercing             Do not wear lotions, powders, cologne, or deodorant              Men may shave face and neck.   Do not bring valuables to the hospital. [place]   Bring small overnight bag day of surgery.              Please read over the following fact sheets you were given: IF YOU HAVE QUESTIONS ABOUT YOUR PRE-OP INSTRUCTIONS PLEASE CALL [facility] - Preparing for Surgery Before surgery, you can play  an important role.  Because skin is not sterile, your skin needs to be as free of germs as possible.  You can reduce the number of germs on your skin by washing with CHG (chlorahexidine gluconate) soap before surgery.  CHG is an antiseptic cleaner which kills germs and bonds with the skin to continue killing germs even after washing. Please DO NOT use if you have an allergy to CHG or antibacterial soaps.  If your skin becomes reddened/irritated stop using the CHG and  inform your nurse when you arrive at Short Stay. Do not shave (including legs and underarms) for at least 48 hours prior to the first CHG shower.  You may shave your face/neck.  Please follow these instructions carefully:  1.  Shower with CHG Soap the night before surgery and the  morning of surgery.  2.  If you choose to wash your hair, wash your hair first as usual with your normal  shampoo.  3.  After you shampoo, rinse your hair and body thoroughly to remove the shampoo.                             4.  Use CHG as you would any other liquid soap.  You can apply chg directly to the skin and wash.  Gently with a scrungie or clean washcloth.  5.  Apply the CHG Soap to your body ONLY FROM THE NECK DOWN.   Do   not use on face/ open                           Wound or open sores. Avoid contact with eyes, ears mouth and   genitals (private parts).                       Wash face,  Genitals (private parts) with your normal soap.             6.  Wash thoroughly, paying special attention to the area where your    surgery  will be performed.  7.  Thoroughly rinse your body with warm water from the neck down.  8.  DO NOT shower/wash with your normal soap after using and rinsing off the CHG Soap.                9.  Pat yourself dry with a clean towel.            10.  Wear clean pajamas.            11.  Place clean sheets on your bed the night of your first shower and do not  sleep with pets. Day of Surgery : Do not apply any lotions/deodorants the morning of surgery.  Please wear clean clothes to the hospital/surgery center.  FAILURE TO FOLLOW THESE INSTRUCTIONS MAY RESULT IN THE CANCELLATION OF YOUR SURGERY  PATIENT SIGNATURE_________________________________  NURSE SIGNATURE__________________________________  ________________________________________________________________________   Keith Schwartz  An incentive spirometer is a tool that can help keep your lungs clear and active. This tool  measures how well you are filling your lungs with each breath. Taking long deep breaths may help reverse or decrease the chance of developing breathing (pulmonary) problems (especially infection) following: A long period of time when you are unable to move or be active. BEFORE THE PROCEDURE  If the spirometer includes an indicator to show your best effort, your  nurse or respiratory therapist will set it to a desired goal. If possible, sit up straight or lean slightly forward. Try not to slouch. Hold the incentive spirometer in an upright position. INSTRUCTIONS FOR USE  Sit on the edge of your bed if possible, or sit up as far as you can in bed or on a chair. Hold the incentive spirometer in an upright position. Breathe out normally. Place the mouthpiece in your mouth and seal your lips tightly around it. Breathe in slowly and as deeply as possible, raising the piston or the ball toward the top of the column. Hold your breath for 3-5 seconds or for as long as possible. Allow the piston or ball to fall to the bottom of the column. Remove the mouthpiece from your mouth and breathe out normally. Rest for a few seconds and repeat Steps 1 through 7 at least 10 times every 1-2 hours when you are awake. Take your time and take a few normal breaths between deep breaths. The spirometer may include an indicator to show your best effort. Use the indicator as a goal to work toward during each repetition. After each set of 10 deep breaths, practice coughing to be sure your lungs are clear. If you have an incision (the cut made at the time of surgery), support your incision when coughing by placing a pillow or rolled up towels firmly against it. Once you are able to get out of bed, walk around indoors and cough well. You may stop using the incentive spirometer when instructed by your caregiver.  RISKS AND COMPLICATIONS Take your time so you do not get dizzy or light-headed. If you are in pain, you may need to  take or ask for pain medication before doing incentive spirometry. It is harder to take a deep breath if you are having pain. AFTER USE Rest and breathe slowly and easily. It can be helpful to keep track of a log of your progress. Your caregiver can provide you with a simple table to help with this. If you are using the spirometer at home, follow these instructions: [place] IF:  You are having difficultly using the spirometer. You have trouble using the spirometer as often as instructed. Your pain medication is not giving enough relief while using the spirometer. You develop fever of 100.5 F (38.1 C) or higher. SEEK IMMEDIATE MEDICAL CARE IF:  You cough up bloody sputum that had not been present before. You develop fever of 102 F (38.9 C) or greater. You develop worsening pain at or near the incision site. MAKE SURE YOU:  Understand these instructions. Will watch your condition. Will get help right away if you are not doing well or get worse. Document Released: 02/14/2007 Document Revised: 12/27/2011 Document Reviewed: 04/17/2007 [employer] Patient Information 2014 [employer], [place].   ________________________________________________________________________  WHAT IS A BLOOD TRANSFUSION? Blood Transfusion Information  A transfusion is the replacement of blood or some of its parts. Blood is made up of multiple cells which provide different functions. Red blood cells carry oxygen and are used for blood loss replacement. White blood cells fight against infection. Platelets control bleeding. Plasma helps clot blood. Other blood products are available for specialized needs, such as hemophilia or other clotting disorders. BEFORE THE TRANSFUSION  Who gives blood for transfusions?  Healthy volunteers who are fully evaluated to make sure their blood is safe. This is blood bank blood. Transfusion therapy is the safest it has ever been in the practice of medicine. Before  blood is  taken from a donor, a complete history is taken to make sure that person has no history of diseases nor engages in risky social behavior (examples are intravenous drug use or sexual activity with multiple partners). The donor's travel history is screened to minimize risk of transmitting infections, such as malaria. The donated blood is tested for signs of infectious diseases, such as HIV and hepatitis. The blood is then tested to be sure it is compatible with you in order to minimize the chance of a transfusion reaction. If you or a relative donates blood, this is often done in anticipation of surgery and is not appropriate for emergency situations. It takes many days to process the donated blood. RISKS AND COMPLICATIONS Although transfusion therapy is very safe and saves many lives, the main dangers of transfusion include:  Getting an infectious disease. Developing a transfusion reaction. This is an allergic reaction to something in the blood you were given. Every precaution is taken to prevent this. The decision to have a blood transfusion has been considered carefully by your caregiver before blood is given. Blood is not given unless the benefits outweigh the risks. AFTER THE TRANSFUSION Right after receiving a blood transfusion, you will usually feel much better and more energetic. This is especially true if your red blood cells have gotten low (anemic). The transfusion raises the level of the red blood cells which carry oxygen, and this usually causes an energy increase. The nurse administering the transfusion will monitor you carefully for complications. HOME CARE INSTRUCTIONS  No special instructions are needed after a transfusion. You may find your energy is better. Speak with your caregiver about any limitations on activity for underlying diseases you may have. SEEK MEDICAL CARE IF:  Your condition is not improving after your transfusion. You develop redness or irritation at the intravenous  (IV) site. SEEK IMMEDIATE MEDICAL CARE IF:  Any of the following symptoms occur over the next 12 hours: Shaking chills. You have a temperature by mouth above 102 F (38.9 C), not controlled by medicine. Chest, back, or muscle pain. People around you feel you are not acting correctly or are confused. Shortness of breath or difficulty breathing. Dizziness and fainting. You get a rash or develop hives. You have a decrease in urine output. Your urine turns a dark color or changes to pink, red, or brown. Any of the following symptoms occur over the next 10 days: You have a temperature by mouth above 102 F (38.9 C), not controlled by medicine. Shortness of breath. Weakness after normal activity. The white part of the eye turns yellow (jaundice). You have a decrease in the amount of urine or are urinating less often. Your urine turns a dark color or changes to pink, red, or brown. Document Released: 10/01/2000 Document Revised: 12/27/2011 Document Reviewed: 05/20/2008 [facility] Patient Information 2014 [place], [place].  _______________________________________________________________________

## 2022-03-08 ENCOUNTER — Encounter (HOSPITAL_COMMUNITY): Payer: Self-pay

## 2022-03-08 ENCOUNTER — Encounter (HOSPITAL_COMMUNITY)
Admission: RE | Admit: 2022-03-08 | Discharge: 2022-03-08 | Disposition: A | Discharge status: Home / Self Care | Payer: Medicare Other | Source: Ambulatory Visit | Attending: Orthopedic Surgery | Admitting: Orthopedic Surgery

## 2022-03-08 VITALS — BP 112/72 | HR 68 | Temp 97.9°F | Resp 14 | Ht 75.0 in | Wt 238.6 lb

## 2022-03-08 DIAGNOSIS — M1712 Unilateral primary osteoarthritis, left knee: Secondary | ICD-10-CM | POA: Insufficient documentation

## 2022-03-08 DIAGNOSIS — T84099A Other mechanical complication of unspecified internal joint prosthesis, initial encounter: Secondary | ICD-10-CM | POA: Diagnosis not present

## 2022-03-08 DIAGNOSIS — E119 Type 2 diabetes mellitus without complications: Secondary | ICD-10-CM | POA: Diagnosis not present

## 2022-03-08 DIAGNOSIS — Z96659 Presence of unspecified artificial knee joint: Secondary | ICD-10-CM | POA: Insufficient documentation

## 2022-03-08 DIAGNOSIS — T84018D Broken internal joint prosthesis, other site, subsequent encounter: Secondary | ICD-10-CM

## 2022-03-08 DIAGNOSIS — Z01812 Encounter for preprocedural laboratory examination: Secondary | ICD-10-CM | POA: Insufficient documentation

## 2022-03-08 DIAGNOSIS — I10 Essential (primary) hypertension: Secondary | ICD-10-CM | POA: Diagnosis not present

## 2022-03-08 DIAGNOSIS — Z01818 Encounter for other preprocedural examination: Secondary | ICD-10-CM

## 2022-03-08 DIAGNOSIS — Y838 Other surgical procedures as the cause of abnormal reaction of the patient, or of later complication, without mention of misadventure at the time of the procedure: Secondary | ICD-10-CM | POA: Insufficient documentation

## 2022-03-08 HISTORY — DX: Presence of automatic (implantable) cardiac defibrillator: Z95.810

## 2022-03-08 LAB — BASIC METABOLIC PANEL
Anion gap: 9 (ref 5–15)
BUN: 34 mg/dL — ABNORMAL HIGH (ref 8–23)
CO2: 25 mmol/L (ref 22–32)
Calcium: 9.9 mg/dL (ref 8.9–10.3)
Chloride: 103 mmol/L (ref 98–111)
Creatinine, Ser: 1.41 mg/dL — ABNORMAL HIGH (ref 0.61–1.24)
GFR, Estimated: 53 mL/min — ABNORMAL LOW (ref >60)
Glucose, Bld: 84 mg/dL (ref 70–99)
Potassium: 4.3 mmol/L (ref 3.5–5.1)
Sodium: 137 mmol/L (ref 135–145)

## 2022-03-08 LAB — CBC
HCT: 46 % (ref 39.0–52.0)
Hemoglobin: 15.4 g/dL (ref 13.0–17.0)
MCH: 30.3 pg (ref 26.0–34.0)
MCHC: 33.5 g/dL (ref 30.0–36.0)
MCV: 90.4 fL (ref 80.0–100.0)
Platelets: 182 10*3/uL (ref 150–400)
RBC: 5.09 MIL/uL (ref 4.22–5.81)
RDW: 14 % (ref 11.5–15.5)
WBC: 7.2 10*3/uL (ref 4.0–10.5)
nRBC: 0 % (ref 0.0–0.2)

## 2022-03-08 LAB — HEMOGLOBIN A1C
Hgb A1c MFr Bld: 7.6 % — ABNORMAL HIGH (ref 4.8–5.6)
Mean Plasma Glucose: 171.42 mg/dL

## 2022-03-08 LAB — SURGICAL PCR SCREEN
MRSA, PCR: NEGATIVE
Staphylococcus aureus: NEGATIVE

## 2022-03-08 LAB — GLUCOSE, CAPILLARY: Glucose-Capillary: 99 mg/dL (ref 70–99)

## 2022-03-08 NOTE — Progress Notes (Signed)
A1C 7.6, results routed to Dr. Alvan Dame.

## 2022-03-08 NOTE — Progress Notes (Unsigned)
Cardiology Office Note:    Date:  03/09/2022   ID:  Keith Schwartz, DOB [birth year], MRN [id number]  PCP:  Susy Frizzle, MD  Cardiologist:  Donato Heinz, MD  Electrophysiologist:  None   Referring MD: Susy Frizzle, MD   Chief Complaint  Patient presents with   Congestive Heart Failure     History of Present Illness:    Keith Schwartz is a 72 y.o. male with a hx of CAD, chronic combined systolic and also heart failure, T2DM, hypertension who presents for follow-up.  He was referred by Dr. Dennard Schaumann for evaluation of syncope, initially seen on 11/12/2020.  He had an episode of syncope on 10/16/2020.  Reports he had bronchitis at the time and had taken DayQuil that morning, in addition to taking lisinopril.  He was at [facility] and had checked his blood pressure and noted it was low, 90s over 60s.  Subsequently was standing in the checkout line and suddenly passed out.  Denies any prodromal symptoms.  States that he fell to the ground, did not hit his head.  Thinks he was unconscious only for a few seconds.  EMS was called and reportedly BP was down to 70s.  He received IV fluids and BP up to 90s on arrival to ED.  In ED, EKG showed left bundle branch block.  Labs notable for creatinine 1.75.  He has not taken lisinopril since discharge from ED.  He denies any lightheadedness or syncope since this episode, or prior to that.  Denies any palpitations.  Does report he has been having chest pain.  Describes as pressure in center of upper chest that occurs with exertion.  States that he will continue to exert himself and it will subside, typically last for 1 to 2 minutes.  No smoking history.  Family history includes father had MI and died of CHF in 58s.  Echocardiogram on 12/09/2020 showed EF 30%, normal RV function.  Lexiscan Myoview on 12/09/2020 showed large severe fixed defect involving inferior and anterior walls with preservation of lateral wall and LVEF 24%, suggesting  extensive infarct.  Cardiac catheterization on 12/22/2020 showed diffusely diseased LAD with positive RFR, nonobstructive LCx stenosis, mild diffuse RCA disease, normal right heart pressures.  Medical therapy recommended.  Zio patch x14 days on 12/15/2020 showed 1 episode of NSVT lasting 4 beats, 3 episodes of SVT longest lasting 10 beats.  Echocardiogram 05/19/21 shows LVEF 30 to 35%, dyssynchrony consistent with LBBB, normal RV function, no significant valvular disease.  Cardiac MRI on 07/13/2021 showed LV EF 34%, RVEF 52%, RV insertion site LGE.  BiV ICD placed by Dr. Lovena Le on 10/20/2021.   Since last clinic visit, he reports that he is doing OK.  Having a lot of knee pain, planning for surgery next week.  He denies any chest pain, dyspnea, headedness, syncope, lower extremity edema, or palpitations.  Reports his weight has been stable.  He can walk up flight of stairs without any chest pain or dyspnea but reports he has been limited by knee pain.   Wt Readings from Last 3 Encounters:  03/09/22 247 lb 12.8 oz (112.4 kg)  03/08/22 238 lb 9.6 oz (108.2 kg)  01/21/22 236 lb (107 kg)     Past Medical History:  Diagnosis Date   AICD (automatic cardioverter/defibrillator) present    Arthritis    CHF (congestive heart failure) ([place])    Coronary artery disease    Diabetes mellitus without complication ([place]) [id number]   History  of kidney stones    Hypertension 10/18/2000   ICD (implantable cardioverter-defibrillator) in place    Ischemic cardiomyopathy    Pneumonia    walking   Sleep apnea    uses cpap at times    Past Surgical History:  Procedure Laterality Date   APPENDECTOMY  10/18/1968   BIV ICD INSERTION CRT-D N/A 10/20/2021   Procedure: BIV ICD INSERTION CRT-D;  Surgeon: Evans Lance, MD;  Location: [facility];  Service: Cardiovascular;  Laterality: N/A;   HERNIA REPAIR     INTRAVASCULAR PRESSURE WIRE/FFR STUDY N/A 12/22/2020   Procedure: INTRAVASCULAR PRESSURE WIRE/FFR  STUDY;  Surgeon: Sherren Mocha, MD;  Location: [facility];  Service: Cardiovascular;  Laterality: N/A;   JOINT REPLACEMENT  10/18/2010   rt knee   RIGHT/LEFT HEART CATH AND CORONARY ANGIOGRAPHY N/A 12/22/2020   Procedure: RIGHT/LEFT HEART CATH AND CORONARY ANGIOGRAPHY;  Surgeon: Sherren Mocha, MD;  Location: [facility];  Service: Cardiovascular;  Laterality: N/A;   thumb surgery Right    VASECTOMY  10/18/1984    Current Medications: Current Meds  Medication Sig   acetaminophen (TYLENOL) 500 MG tablet Take 500-1,000 mg by mouth every 6 (six) hours as needed for moderate pain.   Ascorbic Acid (VITAMIN C) 1000 MG tablet Take 1,000 mg by mouth in the morning.   aspirin 81 MG EC tablet Take 81 mg by mouth in the morning.   atorvastatin (LIPITOR) 80 MG tablet Take 1 tablet (80 mg total) by mouth daily.   betamethasone dipropionate (DIPROLENE) 0.05 % ointment Apply 1 application. topically daily as needed.   Cholecalciferol (QC VITAMIN D3) 50 MCG (2000 UT) TABS Take 2,000 Units by mouth in the morning.   empagliflozin (JARDIANCE) 25 MG TABS tablet Take 12.5 mg by mouth daily.   glimepiride (AMARYL) 4 MG tablet Take 4 mg by mouth daily.   Glucose Blood (BLOOD GLUCOSE TEST STRIPS) STRP Please dispense based on patient and insurance preference. Use as directed to monitor FSBS 3x daily. Dx: [id number].   hydrOXYzine (ATARAX) 10 MG tablet Take 10 mg by mouth every 8 (eight) hours as needed for itching.   magnesium oxide (MAG-OX) 400 MG tablet Take 400 mg by mouth daily.   melatonin 5 MG TABS Take 15 mg by mouth at bedtime as needed (sleep).   metFORMIN (GLUCOPHAGE) 1000 MG tablet TAKE 1 TABLET TWICE A DAY WITH MEALS   metoprolol succinate (TOPROL XL) 50 MG 24 hr tablet Take 1 tablet (50 mg total) by mouth daily.   Multiple Vitamin (MULTIVITAMIN) tablet Take 1 tablet by mouth in the morning. MEN'S ONE A DAY 50+   nitroGLYCERIN (NITROSTAT) 0.4 MG SL tablet Place 1 tablet (0.4 mg total)  under the tongue every 5 (five) minutes as needed for chest pain.   pramipexole (MIRAPEX) 1 MG tablet Take 1 mg by mouth at bedtime.   sacubitril-valsartan (ENTRESTO) 24-26 MG Take 1 tablet by mouth 2 (two) times daily.   Semaglutide,0.25 or 0.5MG /DOS, (OZEMPIC, 0.25 OR 0.5 MG/DOSE,) 2 MG/1.5ML SOPN Inject 1 mg into the skin every Thursday.   vitamin E 1000 UNIT capsule Take 1,000 Units by mouth in the morning.   zinc gluconate 50 MG tablet Take 50 mg by mouth daily.     Allergies:   Glipizide and Penicillins   Social History   Socioeconomic History   Marital status: Married    Spouse name: Not on file   Number of children: Not on file  Years of education: Not on file   Highest education level: Not on file  Occupational History   Not on file  Tobacco Use   Smoking status: Never   Smokeless tobacco: Never  Vaping Use   Vaping Use: Never used  Substance and Sexual Activity   Alcohol use: Not Currently    Alcohol/week: 1.0 standard drink    Types: 1 Cans of beer per week   Drug use: Never   Sexual activity: Not Currently    Birth control/protection: Surgical  Other Topics Concern   Not on file  Social History Narrative   Lives in [place] with spouse.   Retired Psychologist, sport and exercise.   Former Microbiologist, Education officer, museum, Oceanographer    Social Determinants of Sales executive: Not on [employer] Insecurity: No Food Insecurity   Worried About Charity fundraiser in the Last Year: Never true   Arboriculturist in the Last Year: Never true  Transportation Needs: No Data processing manager (Medical): No   Lack of Transportation (Non-Medical): No  Physical Activity: Not on file  Stress: Not on file  Social Connections: Not on file     Family History: The patient's family history includes Arthritis in his mother; Asthma in his mother; Early death in his sister; Hearing loss in his mother; Heart disease in his father; Hyperlipidemia in his father;  Hypertension in his father; Vision loss in his mother.  ROS:   Please see the history of present illness. All other systems reviewed and are negative.  EKGs/Labs/Other Studies Reviewed:    The following studies were reviewed today:  EKG:   12/30/2020: normal sinus rhythm, rate 80, left bundle branch block 02/16/2021: EKG is not ordered today.   Recent Labs: 07/27/2021: ALT 41 08/25/2021: Magnesium 1.9 03/08/2022: BUN 34; Creatinine, Ser 1.41; Hemoglobin 15.4; Platelets 182; Potassium 4.3; Sodium 137  Recent Lipid Panel    Component Value Date/Time   CHOL 109 06/02/2021 1205   TRIG 194 (H) 06/02/2021 1205   HDL 30 (L) 06/02/2021 1205   CHOLHDL 3.6 06/02/2021 1205   LDLCALC 47 06/02/2021 1205    Physical Exam:    VS:  BP 118/74   Pulse 64   Ht 6\' 4"  (1.93 m)   Wt 247 lb 12.8 oz (112.4 kg)   SpO2 98%   BMI 30.16 kg/m     Wt Readings from Last 3 Encounters:  03/09/22 247 lb 12.8 oz (112.4 kg)  03/08/22 238 lb 9.6 oz (108.2 kg)  01/21/22 236 lb (107 kg)     GEN: Well nourished, well developed in no acute distress HEENT: Normal NECK: No JVD CARDIAC:RRR, 2 out of 6 systolic murmur RESPIRATORY:  Clear to auscultation without rales, wheezing or rhonchi  ABDOMEN: Soft, non-tender, non-distended MUSCULOSKELETAL:  No edema SKIN: Warm and dry NEUROLOGIC:  Alert and oriented x 3 PSYCHIATRIC:  Normal affect   ASSESSMENT:    1. Chronic combined systolic and diastolic heart failure ([place])   2. Coronary artery disease involving native coronary artery of native heart without angina pectoris   3. Pre-op evaluation   4. Essential hypertension      PLAN:    Preop evaluation: Prior to knee surgery.  Denies any chest pain or dyspnea.  Functional capacity greater than 4 METS.  RCRI score 2 (CAD, CHF).  He is at least moderate risk for surgery given his risk factors, but appears compensated.  We did discuss that  he is at elevated risk but he reports his quality of life is severely  affected by his knee pain and he recognizes that he is at increased risk and would like to proceed.  Will check echocardiogram to reevaluate systolic function prior to surgery, as may have had improvement in EF since BiV ICD was placed.  Otherwise no cardiac work-up recommended prior to surgery.  CAD: Reported chest pain concerning for angina and EKG with left bundle branch block.  Echocardiogram on 12/09/2020 showed EF 30%, normal RV function.  Lexiscan Myoview on 12/09/2020 showed large severe fixed defect involving inferior and anterior walls with preservation of lateral wall and LVEF 24%, suggesting extensive infarct.  Cardiac catheterization on 12/22/2020 showed diffusely diseased LAD with positive RFR, nonobstructive LCx stenosis, mild diffuse RCA disease, normal right heart pressures.  Medical management recommended for obstructive LAD disease.  Reports occasional exertional chest pain, has not had to use nitroglycerin -Continue aspirin 81 mg daily -Continue atorvastatin 80 mg daily.  LDL 47 on 06/02/2021 -Continue Toprol-XL 25 mg daily -As needed sublingual nitroglycerin.   Chronic combined systolic and diastolic heart failure: EF 30% on echocardiogram 12/09/2020.  Diffuse severe LAD disease as above, medical management recommended.  Echocardiogram 05/19/21 shows LVEF 30 to 35%, dyssynchrony consistent with LBBB.  Cardiac MRI on 07/13/2021 showed LV EF 34%, RVEF 52%, RV insertion site LGE. -Continue Entresto 24-26 mg twice daily.  Developed hypotension/hyperkalemia on 49-51 mg BID -Continue Toprol-XL 25 mg daily -Continue Jardiance 10 mg daily -Continue spironolactone 12.5 mg daily -EF remained <35% despite GDMT, and considering his LBBB with QRS 149ms, referred to EP and underwent BiV ICD placement on 10/20/2021.  Recent device interrogation shows 99% BiV pacing.  Will repeat echocardiogram to evaluate for improvement in systolic function   Syncope: Suspect due to hypotension, which may have been due to  dehydration in setting of URI and continuing to take lisinopril.  SBP in 70s on EMS arrival.  No prodromal symptoms, arrhythmia remains on differential.  Echocardiogram on 12/09/2020 showed EF 30%, normal RV function.   Zio patch x14 days on 12/15/2020 showed 1 episode of NSVT lasting 4 beats, 3 episodes of SVT longest lasting 10 beats.   Hypertension: Continue Entresto and Toprol-XL as above   Hyperlipidemia: On atorvastatin 80 mg daily.  LDL 47 on 06/02/2021   T2DM: On metformin, glimepiride, Trulicity, Jardiance.  A1c 7.6% on 03/08/22   CKD stage 3a: Creatinine 1.75 on 10/16/2020, improved to creatinine 1.4 on 03/08/22   Leg pain: ABIs indicate noncompressible vessels, normal TBI's on 01/08/2021.  Lower extremity duplex shows heavy calcifications, questionable distal occlusion in the left posterior tibial artery.     RTC in 3 months     Medication Adjustments/Labs and Tests Ordered: Current medicines are reviewed at length with the patient today.  Concerns regarding medicines are outlined above.  Orders Placed This Encounter  Procedures   ECHOCARDIOGRAM COMPLETE    No orders of the defined types were placed in this encounter.    Patient Instructions  Medication Instructions:  Your physician recommends that you continue on your current medications as directed. Please refer to the Current Medication list given to you today.  *If you need a refill on your cardiac medications before your next appointment, please call your pharmacy*  Testing/Procedures: Your physician has requested that you have an echocardiogram ASAP. Echocardiography is a painless test that uses sound waves to create images of your heart. It provides your doctor with information about the size  and shape of your heart and how well your heart's chambers and valves are working. This procedure takes approximately one hour. There are no restrictions for this procedure.  Follow-Up: At [facility], you and your health  needs are our priority.  As part of our continuing mission to provide you with exceptional heart care, we have created designated Provider Care Teams.  These Care Teams include your primary Cardiologist (physician) and Advanced Practice Providers (APPs -  Physician Assistants and Nurse Practitioners) who all work together to provide you with the care you need, when you need it.  We recommend signing up for the patient portal called "MyChart".  Sign up information is provided on this After Visit Summary.  MyChart is used to connect with patients for Virtual Visits (Telemedicine).  Patients are able to view lab/test results, encounter notes, upcoming appointments, etc.  Non-urgent messages can be sent to your provider as well.   To learn more about what you can do with MyChart, go to [email or web address].    Your next appointment:   3 month(s)  The format for your next appointment:   In Person  Provider:   Donato Heinz, MD {    Important Information About Sugar           Signed, Donato Heinz, MD  03/09/2022 4:02 PM    [place]

## 2022-03-09 ENCOUNTER — Encounter: Payer: Self-pay | Admitting: Cardiology

## 2022-03-09 ENCOUNTER — Ambulatory Visit (INDEPENDENT_AMBULATORY_CARE_PROVIDER_SITE_OTHER): Payer: Medicare Other | Admitting: Cardiology

## 2022-03-09 VITALS — BP 118/74 | HR 64 | Ht 76.0 in | Wt 247.8 lb

## 2022-03-09 DIAGNOSIS — I5042 Chronic combined systolic (congestive) and diastolic (congestive) heart failure: Secondary | ICD-10-CM | POA: Diagnosis not present

## 2022-03-09 DIAGNOSIS — I10 Essential (primary) hypertension: Secondary | ICD-10-CM

## 2022-03-09 DIAGNOSIS — Z01818 Encounter for other preprocedural examination: Secondary | ICD-10-CM | POA: Diagnosis not present

## 2022-03-09 DIAGNOSIS — I2510 Atherosclerotic heart disease of native coronary artery without angina pectoris: Secondary | ICD-10-CM | POA: Diagnosis not present

## 2022-03-09 NOTE — Patient Instructions (Signed)
Medication Instructions:  Your physician recommends that you continue on your current medications as directed. Please refer to the Current Medication list given to you today.  *If you need a refill on your cardiac medications before your next appointment, please call your pharmacy*  Testing/Procedures: Your physician has requested that you have an echocardiogram ASAP. Echocardiography is a painless test that uses sound waves to create images of your heart. It provides your doctor with information about the size and shape of your heart and how well your heart's chambers and valves are working. This procedure takes approximately one hour. There are no restrictions for this procedure.  Follow-Up: At [facility], you and your health needs are our priority.  As part of our continuing mission to provide you with exceptional heart care, we have created designated Provider Care Teams.  These Care Teams include your primary Cardiologist (physician) and Advanced Practice Providers (APPs -  Physician Assistants and Nurse Practitioners) who all work together to provide you with the care you need, when you need it.  We recommend signing up for the patient portal called "MyChart".  Sign up information is provided on this After Visit Summary.  MyChart is used to connect with patients for Virtual Visits (Telemedicine).  Patients are able to view lab/test results, encounter notes, upcoming appointments, etc.  Non-urgent messages can be sent to your provider as well.   To learn more about what you can do with MyChart, go to [email or web address].    Your next appointment:   3 month(s)  The format for your next appointment:   In Person  Provider:   Donato Heinz, MD {    Important Information About Sugar

## 2022-03-10 ENCOUNTER — Ambulatory Visit (HOSPITAL_BASED_OUTPATIENT_CLINIC_OR_DEPARTMENT_OTHER)
Admission: RE | Admit: 2022-03-10 | Discharge: 2022-03-10 | Disposition: A | Discharge status: Home / Self Care | Payer: Medicare Other | Source: Ambulatory Visit | Attending: Cardiology | Admitting: Cardiology

## 2022-03-10 DIAGNOSIS — I5042 Chronic combined systolic (congestive) and diastolic (congestive) heart failure: Secondary | ICD-10-CM | POA: Diagnosis not present

## 2022-03-10 LAB — ECHOCARDIOGRAM COMPLETE
AR max vel: 2.09 cm2
AV Area VTI: 1.83 cm2
AV Area mean vel: 2.05 cm2
AV Mean grad: 6 mmHg
AV Peak grad: 10.1 mmHg
Ao pk vel: 1.59 m/s
Area-P 1/2: 3.31 cm2
Calc EF: 48.9 %
S' Lateral: 4.3 cm
Single Plane A2C EF: 56 %
Single Plane A4C EF: 38.2 %

## 2022-03-10 MED ORDER — PERFLUTREN LIPID MICROSPHERE
1.0000 mL | INTRAVENOUS | Status: AC | PRN
  Administered 2022-03-10: 2 mL via INTRAVENOUS

## 2022-03-10 NOTE — Progress Notes (Addendum)
  Echocardiogram 2D Echocardiogram with contrast has been performed.  Merrie Roof F 03/10/2022, 3:21 PM

## 2022-03-11 NOTE — Anesthesia Preprocedure Evaluation (Addendum)
Anesthesia Evaluation  Patient identified by MRN, date of birth, ID band Patient awake    Reviewed: Allergy & Precautions, NPO status , Patient's Chart, lab work & pertinent test results  Airway Mallampati: II  TM Distance: >3 FB Neck ROM: Full    Dental  (+) Dental Advisory Given, Teeth Intact   Pulmonary sleep apnea , pneumonia,    Pulmonary exam normal breath sounds clear to auscultation       Cardiovascular hypertension, Pt. on medications and Pt. on home beta blockers + angina + CAD and +CHF  Normal cardiovascular exam+ Cardiac Defibrillator  Rhythm:Regular Rate:Normal  Echo 02/2022 1. Left ventricular ejection fraction, by estimation, is 45 to 50%. The left ventricle has mildly decreased function. The left ventricle demonstrates regional wall motion abnormalities (see scoring diagram/findings for description). There is mild left ventricular hypertrophy of the basal-septal segment. Left ventricular diastolic parameters are consistent with Grade I diastolic dysfunction (impaired relaxation). There is mild hypokinesis of the left ventricular, mid-apical anteroseptal wall.  2. Right ventricular systolic function is normal. The right ventricular size is normal. There is normal pulmonary artery systolic pressure.  3. The mitral valve is normal in structure. No evidence of mitral valve regurgitation. No evidence of mitral stenosis.  4. The aortic valve is normal in structure. There is mild calcification of the aortic valve. There is mild thickening of the aortic valve. Aortic valve regurgitation is not visualized. No aortic stenosis is present.  5. The inferior vena cava is normal in size with greater than 50% respiratory variability, suggesting right atrial pressure of 3 mmHg.    Neuro/Psych negative neurological ROS     GI/Hepatic negative GI ROS, Neg liver ROS,   Endo/Other  diabetes, Poorly Controlled  Renal/GU negative  Renal ROS     Musculoskeletal  (+) Arthritis ,   Abdominal   Peds  Hematology negative hematology ROS (+)   Anesthesia Other Findings   Reproductive/Obstetrics                          Anesthesia Physical Anesthesia Plan  ASA: 4  Anesthesia Plan: General   Post-op Pain Management: Tylenol PO (pre-op)*, Celebrex PO (pre-op)* and Regional block*   Induction: Intravenous  PONV Risk Score and Plan: 3 and Ondansetron, Dexamethasone, Treatment may vary due to age or medical condition and Midazolam  Airway Management Planned: Oral ETT  Additional Equipment: None  Intra-op Plan:   Post-operative Plan: Extubation in OR  Informed Consent: I have reviewed the patients History and Physical, chart, labs and discussed the procedure including the risks, benefits and alternatives for the proposed anesthesia with the patient or authorized representative who has indicated his/her understanding and acceptance.     Dental advisory given  Plan Discussed with: CRNA  Anesthesia Plan Comments: (See PAT note 03/08/2022)      Anesthesia Quick Evaluation

## 2022-03-11 NOTE — Progress Notes (Signed)
Anesthesia Chart Review   Case: Keith Schwartz Date/Time: 03/16/22 1545   Procedure: TOTAL KNEE REVISION (Right: Knee)   Anesthesia type: Spinal   Pre-op diagnosis: FAILED RIGHT TOTAL KNEE ARTHROPLASTY   Location: [facility]   Surgeons: Paralee Cancel, MD       DISCUSSION:71 y.o. never smoker with h/o HTN, CAD, CHF, AICD in place (device orders in 03/05/2022 progress note), LBBB, sleep apnea, DM II, CKD Stage 3a, failed right total knee arthroplasty scheduled for above procedure 03/16/2022 with Dr. Paralee Cancel.   Pt seen by cardiology 03/09/2022 for preoperative evaluation.  Per OV note, "Preop evaluation: Prior to knee surgery.  Denies any chest pain or dyspnea.  Functional capacity greater than 4 METS.  RCRI score 2 (CAD, CHF).  He is at least moderate risk for surgery given his risk factors, but appears compensated.  We did discuss that he is at elevated risk but he reports his quality of life is severely affected by his knee pain and he recognizes that he is at increased risk and would like to proceed.  Will check echocardiogram to reevaluate systolic function prior to surgery, as may have had improvement in EF since BiV ICD was placed.  Otherwise no cardiac work-up recommended prior to surgery."  Echo 03/10/2022, per results, "Heart pumping function has improved since BiV ICD placement, now only mildly reduced.  No further work-up recommended prior to his surgery"  A1C 7.6, forwarded to surgeon.   Anticipate pt can proceed with planned procedure barring acute status change.   VS: BP 112/72   Pulse 68   Temp 36.6 C (Oral)   Resp 14   Ht 6\' 3"  (1.905 m)   Wt 108.2 kg   SpO2 98%   BMI 29.82 kg/m   PROVIDERS: Susy Frizzle, MD is PCP   Oswaldo Milian, MD is Cardiologist  LABS: Labs reviewed: Acceptable for surgery. (all labs ordered are listed, but only abnormal results are displayed)  Labs Reviewed  HEMOGLOBIN A1C - Abnormal; Notable for the following components:       Result Value   Hgb A1c MFr Bld 7.6 (*)    All other components within normal limits  BASIC METABOLIC PANEL - Abnormal; Notable for the following components:   BUN 34 (*)    Creatinine, Ser 1.41 (*)    GFR, Estimated 53 (*)    All other components within normal limits  SURGICAL PCR SCREEN  CBC  GLUCOSE, CAPILLARY  TYPE AND SCREEN     IMAGES:   EKG: 01/21/2022 Rate 72 bpm  NSR  CV: Echo 03/10/2022 1. Left ventricular ejection fraction, by estimation, is 45 to 50%. The  left ventricle has mildly decreased function. The left ventricle  demonstrates regional wall motion abnormalities (see scoring  diagram/findings for description). There is mild left  ventricular hypertrophy of the basal-septal segment. Left ventricular  diastolic parameters are consistent with Grade I diastolic dysfunction  (impaired relaxation). There is mild hypokinesis of the left ventricular,  mid-apical anteroseptal wall.   2. Right ventricular systolic function is normal. The right ventricular  size is normal. There is normal pulmonary artery systolic pressure.   3. The mitral valve is normal in structure. No evidence of mitral valve  regurgitation. No evidence of mitral stenosis.   4. The aortic valve is normal in structure. There is mild calcification  of the aortic valve. There is mild thickening of the aortic valve. Aortic  valve regurgitation is not visualized. No aortic  stenosis is present.   5. The inferior vena cava is normal in size with greater than 50%  respiratory variability, suggesting right atrial pressure of 3 mmHg.   Cardiac Cath 12/22/2020 1.  Essentially normal right heart pressures with preserved cardiac output, normal pulmonary artery pressure, and normal wedge pressure. 2.  Patent left main with mild nonobstructive irregularity 3.  Diffusely diseased LAD from the level of the first diagonal all the way to the apex with positive RFR 4.  Nonobstructive left circumflex stenosis 5.   Mild diffuse RCA plaquing without any high-grade stenosis   Recommend initial medical therapy for cardiomyopathy and coronary artery disease with angina.  The diffuse LAD stenosis could be treated with PCI, but would involve a very long length of stenting which comes with higher risk of restenosis in this diabetic gentleman.  He is not an ideal candidate for CABG as his mammary artery would have to be tied to the apical portion of the LAD.  Favor medical therapy.  Myocardial Perfusion 12/09/20 Nuclear stress EF: 24%. The left ventricular ejection fraction is severely decreased (<30%). No T wave inversion was noted during stress. There was no ST segment deviation noted during stress. Defect 1: There is a large defect of severe severity present in the basal inferior, mid anterior, mid inferior, apical anterior, apical inferior and apex location. Findings consistent with prior myocardial infarction. This is a high risk study.   Large size, severe severity fixed perfusion defect involving the inferior, apical and mid to distal anterior walls, with preservation of the lateral wall, suggestive of extensive infarct (SSS 15, SRS 16, SDS -1). LVEF 24% with severe hypokinesis to akinesis of the above mentioned walls. This is a high risk study. Further evaluation with cardiac catheterization is recommended.   Past Medical History:  Diagnosis Date   AICD (automatic cardioverter/defibrillator) present    Arthritis    CHF (congestive heart failure) (HCC)    Coronary artery disease    Diabetes mellitus without complication (HCC) 10/18/2000   History of kidney stones    Hypertension 10/18/2000   ICD (implantable cardioverter-defibrillator) in place    Ischemic cardiomyopathy    Pneumonia    walking   Sleep apnea    uses cpap at times    Past Surgical History:  Procedure Laterality Date   APPENDECTOMY  10/18/1968   BIV ICD INSERTION CRT-D N/A 10/20/2021   Procedure: BIV ICD INSERTION CRT-D;   Surgeon: Marinus Maw, MD;  Location: [facility] INVASIVE CV LAB;  Service: Cardiovascular;  Laterality: N/A;   HERNIA REPAIR     INTRAVASCULAR PRESSURE WIRE/FFR STUDY N/A 12/22/2020   Procedure: INTRAVASCULAR PRESSURE WIRE/FFR STUDY;  Surgeon: Tonny Bollman, MD;  Location: [facility] INVASIVE CV LAB;  Service: Cardiovascular;  Laterality: N/A;   JOINT REPLACEMENT  10/18/2010   rt knee   RIGHT/LEFT HEART CATH AND CORONARY ANGIOGRAPHY N/A 12/22/2020   Procedure: RIGHT/LEFT HEART CATH AND CORONARY ANGIOGRAPHY;  Surgeon: Tonny Bollman, MD;  Location: [facility] INVASIVE CV LAB;  Service: Cardiovascular;  Laterality: N/A;   thumb surgery Right    VASECTOMY  10/18/1984    MEDICATIONS:  acetaminophen (TYLENOL) 500 MG tablet   Ascorbic Acid (VITAMIN C) 1000 MG tablet   aspirin 81 MG EC tablet   atorvastatin (LIPITOR) 80 MG tablet   betamethasone dipropionate (DIPROLENE) 0.05 % ointment   Cholecalciferol (QC VITAMIN D3) 50 MCG (2000 UT) TABS   doxycycline (VIBRAMYCIN) 100 MG capsule   empagliflozin (JARDIANCE) 25 MG TABS tablet  glimepiride (AMARYL) 4 MG tablet   Glucose Blood (BLOOD GLUCOSE TEST STRIPS) STRP   hydrOXYzine (ATARAX) 10 MG tablet   magnesium oxide (MAG-OX) 400 MG tablet   melatonin 5 MG TABS   metFORMIN (GLUCOPHAGE) 1000 MG tablet   metoprolol succinate (TOPROL XL) 50 MG 24 hr tablet   Multiple Vitamin (MULTIVITAMIN) tablet   nitroGLYCERIN (NITROSTAT) 0.4 MG SL tablet   pramipexole (MIRAPEX) 1 MG tablet   sacubitril-valsartan (ENTRESTO) 24-26 MG   Semaglutide,0.25 or 0.5MG /DOS, (OZEMPIC, 0.25 OR 0.5 MG/DOSE,) 2 MG/1.5ML SOPN   spironolactone (ALDACTONE) 25 MG tablet   vitamin E 1000 UNIT capsule   zinc gluconate 50 MG tablet   No current facility-administered medications for this encounter.    Konrad Felix Ward, PA-C [facility] [phone]

## 2022-03-16 ENCOUNTER — Encounter (HOSPITAL_COMMUNITY)
Admission: RE | Disposition: A | Discharge status: Home / Self Care | Payer: Self-pay | Source: Home / Self Care | Attending: Orthopedic Surgery

## 2022-03-16 ENCOUNTER — Encounter (HOSPITAL_COMMUNITY): Payer: Self-pay | Admitting: Orthopedic Surgery

## 2022-03-16 ENCOUNTER — Inpatient Hospital Stay (HOSPITAL_COMMUNITY): Payer: No Typology Code available for payment source | Admitting: Physician Assistant

## 2022-03-16 ENCOUNTER — Other Ambulatory Visit: Payer: Self-pay

## 2022-03-16 ENCOUNTER — Inpatient Hospital Stay (HOSPITAL_COMMUNITY): Payer: No Typology Code available for payment source | Admitting: Certified Registered Nurse Anesthetist

## 2022-03-16 ENCOUNTER — Inpatient Hospital Stay (HOSPITAL_COMMUNITY)
Admission: RE | Admit: 2022-03-16 | Discharge: 2022-03-17 | DRG: 467 | Disposition: A | Discharge status: Home / Self Care | Payer: No Typology Code available for payment source | Attending: Orthopedic Surgery | Admitting: Orthopedic Surgery

## 2022-03-16 DIAGNOSIS — E782 Mixed hyperlipidemia: Secondary | ICD-10-CM | POA: Diagnosis present

## 2022-03-16 DIAGNOSIS — Z880 Allergy status to penicillin: Secondary | ICD-10-CM

## 2022-03-16 DIAGNOSIS — I110 Hypertensive heart disease with heart failure: Secondary | ICD-10-CM

## 2022-03-16 DIAGNOSIS — M1712 Unilateral primary osteoarthritis, left knee: Secondary | ICD-10-CM | POA: Diagnosis present

## 2022-03-16 DIAGNOSIS — Z821 Family history of blindness and visual loss: Secondary | ICD-10-CM | POA: Diagnosis not present

## 2022-03-16 DIAGNOSIS — Z7982 Long term (current) use of aspirin: Secondary | ICD-10-CM | POA: Diagnosis not present

## 2022-03-16 DIAGNOSIS — I25119 Atherosclerotic heart disease of native coronary artery with unspecified angina pectoris: Secondary | ICD-10-CM

## 2022-03-16 DIAGNOSIS — T84032A Mechanical loosening of internal right knee prosthetic joint, initial encounter: Secondary | ICD-10-CM

## 2022-03-16 DIAGNOSIS — I2510 Atherosclerotic heart disease of native coronary artery without angina pectoris: Secondary | ICD-10-CM | POA: Diagnosis present

## 2022-03-16 DIAGNOSIS — I255 Ischemic cardiomyopathy: Secondary | ICD-10-CM | POA: Diagnosis present

## 2022-03-16 DIAGNOSIS — Z96651 Presence of right artificial knee joint: Secondary | ICD-10-CM

## 2022-03-16 DIAGNOSIS — Y792 Prosthetic and other implants, materials and accessory orthopedic devices associated with adverse incidents: Secondary | ICD-10-CM | POA: Diagnosis present

## 2022-03-16 DIAGNOSIS — Z888 Allergy status to other drugs, medicaments and biological substances status: Secondary | ICD-10-CM | POA: Diagnosis not present

## 2022-03-16 DIAGNOSIS — Z79899 Other long term (current) drug therapy: Secondary | ICD-10-CM

## 2022-03-16 DIAGNOSIS — Z8249 Family history of ischemic heart disease and other diseases of the circulatory system: Secondary | ICD-10-CM | POA: Diagnosis not present

## 2022-03-16 DIAGNOSIS — Z825 Family history of asthma and other chronic lower respiratory diseases: Secondary | ICD-10-CM

## 2022-03-16 DIAGNOSIS — G4730 Sleep apnea, unspecified: Secondary | ICD-10-CM | POA: Diagnosis present

## 2022-03-16 DIAGNOSIS — I5022 Chronic systolic (congestive) heart failure: Secondary | ICD-10-CM | POA: Diagnosis present

## 2022-03-16 DIAGNOSIS — I509 Heart failure, unspecified: Secondary | ICD-10-CM

## 2022-03-16 DIAGNOSIS — Z7984 Long term (current) use of oral hypoglycemic drugs: Secondary | ICD-10-CM | POA: Diagnosis not present

## 2022-03-16 DIAGNOSIS — T84062A Wear of articular bearing surface of internal prosthetic right knee joint, initial encounter: Secondary | ICD-10-CM | POA: Diagnosis present

## 2022-03-16 DIAGNOSIS — T84018D Broken internal joint prosthesis, other site, subsequent encounter: Secondary | ICD-10-CM

## 2022-03-16 DIAGNOSIS — Z95810 Presence of automatic (implantable) cardiac defibrillator: Secondary | ICD-10-CM | POA: Diagnosis not present

## 2022-03-16 DIAGNOSIS — E119 Type 2 diabetes mellitus without complications: Secondary | ICD-10-CM | POA: Diagnosis present

## 2022-03-16 DIAGNOSIS — Z20822 Contact with and (suspected) exposure to covid-19: Secondary | ICD-10-CM | POA: Diagnosis present

## 2022-03-16 DIAGNOSIS — Z87442 Personal history of urinary calculi: Secondary | ICD-10-CM

## 2022-03-16 HISTORY — PX: TOTAL KNEE REVISION: SHX996

## 2022-03-16 LAB — GLUCOSE, CAPILLARY
Glucose-Capillary: 123 mg/dL — ABNORMAL HIGH (ref 70–99)
Glucose-Capillary: 182 mg/dL — ABNORMAL HIGH (ref 70–99)

## 2022-03-16 LAB — TYPE AND SCREEN
ABO/RH(D): O NEG
Antibody Screen: NEGATIVE

## 2022-03-16 SURGERY — TOTAL KNEE REVISION
Anesthesia: General | Site: Knee | Laterality: Right

## 2022-03-16 MED ORDER — METHOCARBAMOL 500 MG IVPB - SIMPLE MED
INTRAVENOUS | Status: AC
  Administered 2022-03-16: 500 mg via INTRAVENOUS
  Filled 2022-03-16: qty 50

## 2022-03-16 MED ORDER — METHOCARBAMOL 500 MG IVPB - SIMPLE MED: 500.0000 mg | Freq: Four times a day (QID) | INTRAVENOUS | Status: DC | PRN

## 2022-03-16 MED ORDER — AMISULPRIDE (ANTIEMETIC) 5 MG/2ML IV SOLN: 10.0000 mg | Freq: Once | INTRAVENOUS | Status: DC | PRN

## 2022-03-16 MED ORDER — BUPIVACAINE-EPINEPHRINE (PF) 0.25% -1:200000 IJ SOLN
INTRAMUSCULAR | Status: AC
Start: 2022-03-16 — End: ?
  Filled 2022-03-16: qty 30

## 2022-03-16 MED ORDER — TRANEXAMIC ACID-NACL 1000-0.7 MG/100ML-% IV SOLN
1000.0000 mg | INTRAVENOUS | Status: AC
  Administered 2022-03-16: 1000 mg via INTRAVENOUS
  Filled 2022-03-16: qty 100

## 2022-03-16 MED ORDER — MENTHOL 3 MG MT LOZG
1.0000 | LOZENGE | OROMUCOSAL | Status: DC | PRN
Start: 2022-03-16 — End: 2022-03-17

## 2022-03-16 MED ORDER — LIDOCAINE 2% (20 MG/ML) 5 ML SYRINGE
INTRAMUSCULAR | Status: DC | PRN
  Administered 2022-03-16: 100 mg via INTRAVENOUS

## 2022-03-16 MED ORDER — ONDANSETRON HCL 4 MG/2ML IJ SOLN
INTRAMUSCULAR | Status: DC | PRN
  Administered 2022-03-16: 4 mg via INTRAVENOUS

## 2022-03-16 MED ORDER — OXYCODONE HCL 5 MG PO TABS: 5.0000 mg | ORAL_TABLET | Freq: Once | ORAL | Status: DC | PRN

## 2022-03-16 MED ORDER — PHENYLEPHRINE HCL-NACL 20-0.9 MG/250ML-% IV SOLN
INTRAVENOUS | Status: DC | PRN
  Administered 2022-03-16: 50 ug/min via INTRAVENOUS

## 2022-03-16 MED ORDER — SACUBITRIL-VALSARTAN 24-26 MG PO TABS
1.0000 | ORAL_TABLET | Freq: Two times a day (BID) | ORAL | Status: DC
  Administered 2022-03-17: 1 via ORAL
  Filled 2022-03-16: qty 1

## 2022-03-16 MED ORDER — OXYCODONE HCL 5 MG PO TABS
5.0000 mg | ORAL_TABLET | ORAL | Status: DC | PRN
  Administered 2022-03-16: 5 mg via ORAL
  Administered 2022-03-17 (×2): 10 mg via ORAL
  Filled 2022-03-16 (×2): qty 2

## 2022-03-16 MED ORDER — LACTATED RINGERS IV SOLN: INTRAVENOUS | Status: DC

## 2022-03-16 MED ORDER — FENTANYL CITRATE (PF) 100 MCG/2ML IJ SOLN
INTRAMUSCULAR | Status: AC
  Filled 2022-03-16: qty 2

## 2022-03-16 MED ORDER — PHENYLEPHRINE HCL (PRESSORS) 10 MG/ML IV SOLN
INTRAVENOUS | Status: DC | PRN
  Administered 2022-03-16 (×4): 80 ug via INTRAVENOUS
  Administered 2022-03-16: 160 ug via INTRAVENOUS
  Administered 2022-03-16: 80 ug via INTRAVENOUS

## 2022-03-16 MED ORDER — MELATONIN 5 MG PO TABS: 15.0000 mg | ORAL_TABLET | Freq: Every evening | ORAL | Status: DC | PRN

## 2022-03-16 MED ORDER — MEPERIDINE HCL 50 MG/ML IJ SOLN: 6.2500 mg | INTRAMUSCULAR | Status: DC | PRN

## 2022-03-16 MED ORDER — SODIUM CHLORIDE (PF) 0.9 % IJ SOLN
INTRAMUSCULAR | Status: DC | PRN
  Administered 2022-03-16: 30 mL

## 2022-03-16 MED ORDER — STERILE WATER FOR IRRIGATION IR SOLN
Status: DC | PRN
  Administered 2022-03-16: 2000 mL

## 2022-03-16 MED ORDER — ACETAMINOPHEN 325 MG PO TABS: 325.0000 mg | ORAL_TABLET | Freq: Four times a day (QID) | ORAL | Status: DC | PRN

## 2022-03-16 MED ORDER — LACTATED RINGERS IV SOLN
INTRAVENOUS | Status: DC
Start: 2022-03-16 — End: 2022-03-16

## 2022-03-16 MED ORDER — BISACODYL 10 MG RE SUPP: 10.0000 mg | Freq: Every day | RECTAL | Status: DC | PRN

## 2022-03-16 MED ORDER — HYDROMORPHONE HCL 1 MG/ML IJ SOLN
0.2500 mg | INTRAMUSCULAR | Status: DC | PRN
  Administered 2022-03-16: 0.25 mg via INTRAVENOUS
  Administered 2022-03-16: 0.5 mg via INTRAVENOUS

## 2022-03-16 MED ORDER — KETOROLAC TROMETHAMINE 30 MG/ML IJ SOLN
INTRAMUSCULAR | Status: AC
  Filled 2022-03-16: qty 1

## 2022-03-16 MED ORDER — ONDANSETRON HCL 4 MG PO TABS
4.0000 mg | ORAL_TABLET | Freq: Four times a day (QID) | ORAL | Status: DC | PRN
  Filled 2022-03-16: qty 1

## 2022-03-16 MED ORDER — DEXAMETHASONE SODIUM PHOSPHATE 10 MG/ML IJ SOLN
10.0000 mg | Freq: Once | INTRAMUSCULAR | Status: AC
  Administered 2022-03-17: 10 mg via INTRAVENOUS
  Filled 2022-03-16: qty 1

## 2022-03-16 MED ORDER — HYDROMORPHONE HCL 1 MG/ML IJ SOLN
INTRAMUSCULAR | Status: AC
  Administered 2022-03-16: 0.25 mg via INTRAVENOUS
  Filled 2022-03-16: qty 2

## 2022-03-16 MED ORDER — HYDROXYZINE HCL 10 MG PO TABS: 10.0000 mg | ORAL_TABLET | Freq: Three times a day (TID) | ORAL | Status: DC | PRN

## 2022-03-16 MED ORDER — TRANEXAMIC ACID-NACL 1000-0.7 MG/100ML-% IV SOLN
1000.0000 mg | Freq: Once | INTRAVENOUS | Status: AC
  Administered 2022-03-16: 1000 mg via INTRAVENOUS

## 2022-03-16 MED ORDER — DOCUSATE SODIUM 100 MG PO CAPS
100.0000 mg | ORAL_CAPSULE | Freq: Two times a day (BID) | ORAL | Status: DC
  Administered 2022-03-16 – 2022-03-17 (×2): 100 mg via ORAL
  Filled 2022-03-16 (×2): qty 1

## 2022-03-16 MED ORDER — POLYETHYLENE GLYCOL 3350 17 G PO PACK: 17.0000 g | PACK | Freq: Every day | ORAL | Status: DC | PRN

## 2022-03-16 MED ORDER — ONDANSETRON HCL 4 MG/2ML IJ SOLN: 4.0000 mg | Freq: Four times a day (QID) | INTRAMUSCULAR | Status: DC | PRN

## 2022-03-16 MED ORDER — OXYCODONE HCL 5 MG PO TABS
ORAL_TABLET | ORAL | Status: AC
  Filled 2022-03-16: qty 1

## 2022-03-16 MED ORDER — PROPOFOL 10 MG/ML IV BOLUS
INTRAVENOUS | Status: AC
  Filled 2022-03-16: qty 20

## 2022-03-16 MED ORDER — NITROGLYCERIN 0.4 MG SL SUBL: 0.4000 mg | SUBLINGUAL_TABLET | SUBLINGUAL | Status: DC | PRN

## 2022-03-16 MED ORDER — ACETAMINOPHEN 500 MG PO TABS
1000.0000 mg | ORAL_TABLET | Freq: Four times a day (QID) | ORAL | Status: DC
  Administered 2022-03-16 – 2022-03-17 (×2): 1000 mg via ORAL
  Filled 2022-03-16 (×2): qty 2

## 2022-03-16 MED ORDER — TOBRAMYCIN SULFATE 1.2 G IJ SOLR
INTRAMUSCULAR | Status: AC
  Filled 2022-03-16: qty 1.2

## 2022-03-16 MED ORDER — DIPHENHYDRAMINE HCL 12.5 MG/5ML PO ELIX: 12.5000 mg | ORAL_SOLUTION | ORAL | Status: DC | PRN

## 2022-03-16 MED ORDER — PROPOFOL 10 MG/ML IV BOLUS
INTRAVENOUS | Status: DC | PRN
  Administered 2022-03-16: 200 mg via INTRAVENOUS

## 2022-03-16 MED ORDER — BUPIVACAINE-EPINEPHRINE 0.25% -1:200000 IJ SOLN
INTRAMUSCULAR | Status: DC | PRN
  Administered 2022-03-16: 30 mL

## 2022-03-16 MED ORDER — PRAMIPEXOLE DIHYDROCHLORIDE 0.25 MG PO TABS
1.0000 mg | ORAL_TABLET | Freq: Every day | ORAL | Status: DC
  Administered 2022-03-16: 1 mg via ORAL
  Filled 2022-03-16: qty 4

## 2022-03-16 MED ORDER — KETOROLAC TROMETHAMINE 30 MG/ML IJ SOLN
INTRAMUSCULAR | Status: DC | PRN
  Administered 2022-03-16: 30 mg

## 2022-03-16 MED ORDER — ORAL CARE MOUTH RINSE: 15.0000 mL | Freq: Once | OROMUCOSAL | Status: AC

## 2022-03-16 MED ORDER — CHLORHEXIDINE GLUCONATE 0.12 % MT SOLN
15.0000 mL | Freq: Once | OROMUCOSAL | Status: AC
  Administered 2022-03-16: 15 mL via OROMUCOSAL

## 2022-03-16 MED ORDER — CEFAZOLIN SODIUM-DEXTROSE 2-4 GM/100ML-% IV SOLN
2.0000 g | Freq: Four times a day (QID) | INTRAVENOUS | Status: AC
  Administered 2022-03-16 – 2022-03-17 (×2): 2 g via INTRAVENOUS
  Filled 2022-03-16 (×2): qty 100

## 2022-03-16 MED ORDER — SODIUM CHLORIDE (PF) 0.9 % IJ SOLN
INTRAMUSCULAR | Status: AC
  Filled 2022-03-16: qty 50

## 2022-03-16 MED ORDER — METHOCARBAMOL 500 MG PO TABS: 500.0000 mg | ORAL_TABLET | Freq: Four times a day (QID) | ORAL | Status: DC | PRN

## 2022-03-16 MED ORDER — OXYCODONE HCL 5 MG PO TABS: 10.0000 mg | ORAL_TABLET | ORAL | Status: DC | PRN

## 2022-03-16 MED ORDER — CELECOXIB 100 MG PO CAPS
100.0000 mg | ORAL_CAPSULE | Freq: Once | ORAL | Status: DC
  Filled 2022-03-16: qty 1

## 2022-03-16 MED ORDER — HYDROMORPHONE HCL 1 MG/ML IJ SOLN
0.5000 mg | INTRAMUSCULAR | Status: DC | PRN
  Administered 2022-03-16: 0.5 mg via INTRAVENOUS
  Administered 2022-03-17: 1 mg via INTRAVENOUS
  Filled 2022-03-16: qty 1

## 2022-03-16 MED ORDER — GLIMEPIRIDE 4 MG PO TABS
4.0000 mg | ORAL_TABLET | Freq: Every day | ORAL | Status: DC
  Administered 2022-03-17: 4 mg via ORAL
  Filled 2022-03-16: qty 1

## 2022-03-16 MED ORDER — CEFAZOLIN SODIUM-DEXTROSE 2-4 GM/100ML-% IV SOLN
2.0000 g | INTRAVENOUS | Status: AC
  Administered 2022-03-16: 2 g via INTRAVENOUS
  Filled 2022-03-16: qty 100

## 2022-03-16 MED ORDER — 0.9 % SODIUM CHLORIDE (POUR BTL) OPTIME
TOPICAL | Status: DC | PRN
  Administered 2022-03-16: 1000 mL

## 2022-03-16 MED ORDER — ASPIRIN 81 MG PO CHEW
81.0000 mg | CHEWABLE_TABLET | Freq: Two times a day (BID) | ORAL | Status: DC
  Administered 2022-03-17: 81 mg via ORAL
  Filled 2022-03-16: qty 1

## 2022-03-16 MED ORDER — EMPAGLIFLOZIN 10 MG PO TABS
10.0000 mg | ORAL_TABLET | Freq: Every day | ORAL | Status: DC
  Administered 2022-03-17: 10 mg via ORAL
  Filled 2022-03-16: qty 1

## 2022-03-16 MED ORDER — FENTANYL CITRATE PF 50 MCG/ML IJ SOSY
50.0000 ug | PREFILLED_SYRINGE | INTRAMUSCULAR | Status: DC
  Administered 2022-03-16: 50 ug via INTRAVENOUS
  Filled 2022-03-16: qty 2

## 2022-03-16 MED ORDER — METOPROLOL SUCCINATE ER 50 MG PO TB24
50.0000 mg | ORAL_TABLET | Freq: Every day | ORAL | Status: DC
  Administered 2022-03-17: 50 mg via ORAL
  Filled 2022-03-16: qty 1

## 2022-03-16 MED ORDER — DEXAMETHASONE SODIUM PHOSPHATE 10 MG/ML IJ SOLN
INTRAMUSCULAR | Status: AC
  Filled 2022-03-16: qty 1

## 2022-03-16 MED ORDER — POVIDONE-IODINE 10 % EX SWAB
2.0000 "application " | Freq: Once | CUTANEOUS | Status: AC
  Administered 2022-03-16: 2 via TOPICAL

## 2022-03-16 MED ORDER — HYDROMORPHONE HCL 1 MG/ML IJ SOLN
INTRAMUSCULAR | Status: AC
  Filled 2022-03-16: qty 1

## 2022-03-16 MED ORDER — CLONIDINE HCL (ANALGESIA) 100 MCG/ML EP SOLN
EPIDURAL | Status: DC | PRN
  Administered 2022-03-16: 80 ug

## 2022-03-16 MED ORDER — CELECOXIB 200 MG PO CAPS
ORAL_CAPSULE | ORAL | Status: AC
  Administered 2022-03-16: 200 mg via ORAL
  Filled 2022-03-16: qty 1

## 2022-03-16 MED ORDER — DEXAMETHASONE SODIUM PHOSPHATE 4 MG/ML IJ SOLN
INTRAMUSCULAR | Status: DC | PRN
  Administered 2022-03-16: 5 mg via PERINEURAL

## 2022-03-16 MED ORDER — METOCLOPRAMIDE HCL 5 MG PO TABS
5.0000 mg | ORAL_TABLET | Freq: Three times a day (TID) | ORAL | Status: DC | PRN
  Filled 2022-03-16: qty 2

## 2022-03-16 MED ORDER — MIDAZOLAM HCL 2 MG/2ML IJ SOLN
1.0000 mg | INTRAMUSCULAR | Status: DC
  Filled 2022-03-16: qty 2

## 2022-03-16 MED ORDER — OXYCODONE HCL 5 MG/5ML PO SOLN: 5.0000 mg | Freq: Once | ORAL | Status: DC | PRN

## 2022-03-16 MED ORDER — TRANEXAMIC ACID-NACL 1000-0.7 MG/100ML-% IV SOLN: 1000.0000 mg | Freq: Once | INTRAVENOUS | Status: DC

## 2022-03-16 MED ORDER — PHENOL 1.4 % MT LIQD
1.0000 | OROMUCOSAL | Status: DC | PRN
  Administered 2022-03-16: 1 via OROMUCOSAL
  Filled 2022-03-16: qty 177

## 2022-03-16 MED ORDER — DEXAMETHASONE SODIUM PHOSPHATE 10 MG/ML IJ SOLN
8.0000 mg | Freq: Once | INTRAMUSCULAR | Status: AC
  Administered 2022-03-16: 8 mg via INTRAVENOUS

## 2022-03-16 MED ORDER — CELECOXIB 200 MG PO CAPS: 200.0000 mg | ORAL_CAPSULE | Freq: Once | ORAL | Status: AC

## 2022-03-16 MED ORDER — METFORMIN HCL 500 MG PO TABS
1000.0000 mg | ORAL_TABLET | Freq: Two times a day (BID) | ORAL | Status: DC
Start: 2022-03-17 — End: 2022-03-17
  Administered 2022-03-17: 1000 mg via ORAL
  Filled 2022-03-16: qty 2

## 2022-03-16 MED ORDER — ONDANSETRON HCL 4 MG/2ML IJ SOLN
INTRAMUSCULAR | Status: AC
  Filled 2022-03-16: qty 2

## 2022-03-16 MED ORDER — SODIUM CHLORIDE 0.9 % IV SOLN: INTRAVENOUS | Status: DC

## 2022-03-16 MED ORDER — TRANEXAMIC ACID-NACL 1000-0.7 MG/100ML-% IV SOLN
INTRAVENOUS | Status: AC
  Filled 2022-03-16: qty 100

## 2022-03-16 MED ORDER — SPIRONOLACTONE 12.5 MG HALF TABLET
12.5000 mg | ORAL_TABLET | Freq: Every day | ORAL | Status: DC
  Filled 2022-03-16: qty 1

## 2022-03-16 MED ORDER — ROPIVACAINE HCL 5 MG/ML IJ SOLN
INTRAMUSCULAR | Status: DC | PRN
  Administered 2022-03-16: 30 mL via PERINEURAL

## 2022-03-16 MED ORDER — INSULIN ASPART 100 UNIT/ML IJ SOLN
0.0000 [IU] | Freq: Three times a day (TID) | INTRAMUSCULAR | Status: DC
  Administered 2022-03-17 (×2): 5 [IU] via SUBCUTANEOUS

## 2022-03-16 MED ORDER — FENTANYL CITRATE (PF) 100 MCG/2ML IJ SOLN
INTRAMUSCULAR | Status: DC | PRN
  Administered 2022-03-16 (×3): 50 ug via INTRAVENOUS

## 2022-03-16 MED ORDER — VANCOMYCIN HCL 1000 MG IV SOLR
INTRAVENOUS | Status: AC
  Filled 2022-03-16: qty 20

## 2022-03-16 MED ORDER — ACETAMINOPHEN 500 MG PO TABS
1000.0000 mg | ORAL_TABLET | Freq: Once | ORAL | Status: AC
  Administered 2022-03-16: 1000 mg via ORAL
  Filled 2022-03-16: qty 2

## 2022-03-16 MED ORDER — METOCLOPRAMIDE HCL 5 MG/ML IJ SOLN: 5.0000 mg | Freq: Three times a day (TID) | INTRAMUSCULAR | Status: DC | PRN

## 2022-03-16 MED ORDER — ATORVASTATIN CALCIUM 40 MG PO TABS
80.0000 mg | ORAL_TABLET | Freq: Every day | ORAL | Status: DC
  Administered 2022-03-17: 80 mg via ORAL
  Filled 2022-03-16: qty 2

## 2022-03-16 MED ORDER — SODIUM CHLORIDE 0.9 % IR SOLN
Status: DC | PRN
  Administered 2022-03-16: 3000 mL

## 2022-03-16 MED ORDER — PHENYLEPHRINE HCL-NACL 20-0.9 MG/250ML-% IV SOLN
INTRAVENOUS | Status: AC
  Filled 2022-03-16: qty 250

## 2022-03-16 MED ORDER — FERROUS SULFATE 325 (65 FE) MG PO TABS
325.0000 mg | ORAL_TABLET | Freq: Three times a day (TID) | ORAL | Status: DC
  Administered 2022-03-17: 325 mg via ORAL
  Filled 2022-03-16: qty 1

## 2022-03-16 SURGICAL SUPPLY — 76 items
ADH SKN CLS APL DERMABOND .7 (GAUZE/BANDAGES/DRESSINGS) ×1
ATTUNE MED ANAT PAT 41 KNEE (Knees) ×1 IMPLANT
ATUNE CEMENTED STEM 16X80 (Stem) ×2 IMPLANT
BAG COUNTER SPONGE SURGICOUNT (BAG) ×1 IMPLANT
BAG DECANTER FOR FLEXI CONT (MISCELLANEOUS) IMPLANT
BAG SPEC THK2 15X12 ZIP CLS (MISCELLANEOUS)
BAG SPNG CNTER NS LX DISP (BAG) ×1
BAG ZIPLOCK 12X15 (MISCELLANEOUS) IMPLANT
BASE TIBIAL ATTUNE SZ7 RP REV (Joint) IMPLANT
BLADE SAW SGTL 11.0X1.19X90.0M (BLADE) ×1 IMPLANT
BLADE SAW SGTL 13.0X1.19X90.0M (BLADE) ×3 IMPLANT
BLADE SAW SGTL 81X20 HD (BLADE) ×3 IMPLANT
BLADE SURG SZ10 CARB STEEL (BLADE) ×6 IMPLANT
BNDG CMPR MED 10X6 ELC LF (GAUZE/BANDAGES/DRESSINGS) ×1
BNDG ELASTIC 6X10 VLCR STRL LF (GAUZE/BANDAGES/DRESSINGS) ×1 IMPLANT
BNDG ELASTIC 6X5.8 VLCR STR LF (GAUZE/BANDAGES/DRESSINGS) ×2 IMPLANT
BRUSH FEMORAL CANAL (MISCELLANEOUS) ×1 IMPLANT
BSPLAT TIB 7 CMNT REV ROT PLAT (Joint) ×1 IMPLANT
CEMENT HV SMART SET (Cement) ×3 IMPLANT
CEMENT RESTRICTOR DEPUY SZ 7 (Cement) ×1 IMPLANT
COMP FEM ATTUNE CRS SZ7 RT (Femur) ×2 IMPLANT
COMPONENT FEM ATN CRS SZ7 RT (Femur) IMPLANT
COVER SURGICAL LIGHT HANDLE (MISCELLANEOUS) ×3 IMPLANT
CUFF TOURN SGL QUICK 34 (TOURNIQUET CUFF) ×2
CUFF TRNQT CYL 34X4.125X (TOURNIQUET CUFF) ×2 IMPLANT
DERMABOND ADVANCED (GAUZE/BANDAGES/DRESSINGS) ×1
DERMABOND ADVANCED .7 DNX12 (GAUZE/BANDAGES/DRESSINGS) ×2 IMPLANT
DRAPE INCISE IOBAN 66X45 STRL (DRAPES) ×3 IMPLANT
DRAPE U-SHAPE 47X51 STRL (DRAPES) ×3 IMPLANT
DRESSING AQUACEL AG SP 3.5X10 (GAUZE/BANDAGES/DRESSINGS) IMPLANT
DRSG AQUACEL AG ADV 3.5X10 (GAUZE/BANDAGES/DRESSINGS) ×2 IMPLANT
DRSG AQUACEL AG ADV 3.5X14 (GAUZE/BANDAGES/DRESSINGS) ×1 IMPLANT
DRSG AQUACEL AG SP 3.5X10 (GAUZE/BANDAGES/DRESSINGS)
DURAPREP 26ML APPLICATOR (WOUND CARE) ×6 IMPLANT
ELECT REM PT RETURN 15FT ADLT (MISCELLANEOUS) ×3 IMPLANT
GAUZE SPONGE 2X2 8PLY STRL LF (GAUZE/BANDAGES/DRESSINGS) IMPLANT
GLOVE BIOGEL PI IND STRL 7.5 (GLOVE) ×4 IMPLANT
GLOVE BIOGEL PI IND STRL 8.5 (GLOVE) ×2 IMPLANT
GLOVE BIOGEL PI INDICATOR 7.5 (GLOVE) ×2
GLOVE BIOGEL PI INDICATOR 8.5 (GLOVE) ×1
GLOVE ECLIPSE 8.0 STRL XLNG CF (GLOVE) ×6 IMPLANT
GLOVE INDICATOR 6.5 STRL GRN (GLOVE) ×3 IMPLANT
GOWN STRL REUS W/ TWL LRG LVL3 (GOWN DISPOSABLE) ×4 IMPLANT
GOWN STRL REUS W/TWL LRG LVL3 (GOWN DISPOSABLE) ×4
HANDPIECE INTERPULSE COAX TIP (DISPOSABLE) ×2
HOLDER FOLEY CATH W/STRAP (MISCELLANEOUS) ×1 IMPLANT
INSERT TIB CRS ATTUNE SZ7 16 (Insert) ×1 IMPLANT
KIT TURNOVER KIT A (KITS) ×1 IMPLANT
MANIFOLD NEPTUNE II (INSTRUMENTS) ×3 IMPLANT
NDL SAFETY ECLIPSE 18X1.5 (NEEDLE) IMPLANT
NEEDLE HYPO 18GX1.5 SHARP (NEEDLE) ×2
NS IRRIG 1000ML POUR BTL (IV SOLUTION) ×3 IMPLANT
PACK TOTAL KNEE CUSTOM (KITS) ×3 IMPLANT
PROTECTOR NERVE ULNAR (MISCELLANEOUS) ×3 IMPLANT
SET HNDPC FAN SPRY TIP SCT (DISPOSABLE) ×2 IMPLANT
SET PAD KNEE POSITIONER (MISCELLANEOUS) ×3 IMPLANT
SLEEVE TIB ATTUNE FP 69 (Knees) ×1 IMPLANT
SOLUTION IRRIG SURGIPHOR (IV SOLUTION) IMPLANT
SPIKE FLUID TRANSFER (MISCELLANEOUS) IMPLANT
SPONGE GAUZE 2X2 STER 10/PKG (GAUZE/BANDAGES/DRESSINGS)
STAPLER VISISTAT 35W (STAPLE) IMPLANT
STEM ATTUNE CEMENTED 16X80 (Stem) IMPLANT
STEM STR ATTUNE PF 18X110 (Knees) ×1 IMPLANT
SUT MNCRL AB 3-0 PS2 18 (SUTURE) ×3 IMPLANT
SUT STRATAFIX PDS+ 0 24IN (SUTURE) ×3 IMPLANT
SUT VIC AB 1 CT1 36 (SUTURE) ×4 IMPLANT
SUT VIC AB 2-0 CT1 27 (SUTURE) ×6
SUT VIC AB 2-0 CT1 TAPERPNT 27 (SUTURE) ×6 IMPLANT
SYR 50ML LL SCALE MARK (SYRINGE) ×1 IMPLANT
TIBIAL BASE ATTUNE SZ7 RP REV (Joint) ×2 IMPLANT
TOWER CARTRIDGE SMART MIX (DISPOSABLE) ×3 IMPLANT
TRAY FOLEY MTR SLVR 16FR STAT (SET/KITS/TRAYS/PACK) ×3 IMPLANT
TUBE KAMVAC SUCTION (TUBING) ×1 IMPLANT
TUBE SUCTION HIGH CAP CLEAR NV (SUCTIONS) ×3 IMPLANT
WATER STERILE IRR 1000ML POUR (IV SOLUTION) ×3 IMPLANT
WRAP KNEE MAXI GEL POST OP (GAUZE/BANDAGES/DRESSINGS) ×3 IMPLANT

## 2022-03-16 NOTE — Anesthesia Procedure Notes (Signed)
Procedure Name: LMA Insertion Date/Time: 03/16/2022 3:20 PM Performed by: Sudie Grumbling, CRNA Pre-anesthesia Checklist: Patient identified, Emergency Drugs available, Suction available and Patient being monitored Patient Re-evaluated:Patient Re-evaluated prior to induction Oxygen Delivery Method: Circle system utilized Preoxygenation: Pre-oxygenation with 100% oxygen Induction Type: IV induction LMA: LMA with gastric port inserted LMA Size: 5.0 Number of attempts: 1 Placement Confirmation: positive ETCO2 and breath sounds checked- equal and bilateral Tube secured with: Tape Dental Injury: Teeth and Oropharynx as per pre-operative assessment

## 2022-03-16 NOTE — Brief Op Note (Signed)
03/16/2022  5:54 PM  PATIENT:  Keith Schwartz  71 y.o. male  PRE-OPERATIVE DIAGNOSIS:  FAILED RIGHT TOTAL KNEE ARTHROPLASTY  POST-OPERATIVE DIAGNOSIS:  FAILED RIGHT TOTAL KNEE ARTHROPLASTY due to aseptic polyethylene wear  PROCEDURE:  Procedure(s): TOTAL KNEE REVISION (Right)  FINDINGS: see operative note  SURGEON:  Surgeon(s) and Role:    * Durene Romans, MD - Primary  PHYSICIAN ASSISTANT: Rosalene Billings, PA-C  ANESTHESIA:   regional and general  EBL:  75 mL   BLOOD ADMINISTERED:none  DRAINS: none   LOCAL MEDICATIONS USED:  MARCAINE     SPECIMEN:  No Specimen  DISPOSITION OF SPECIMEN:  N/A  COUNTS:  YES  TOURNIQUET:   Total Tourniquet Time Documented: Thigh (Right) - 79 minutes Total: Thigh (Right) - 79 minutes   DICTATION: .Other Dictation: Dictation Number [id number]  PLAN OF CARE: Admit to inpatient   PATIENT DISPOSITION:  PACU - hemodynamically stable.   Delay start of Pharmacological VTE agent (>24hrs) due to surgical blood loss or risk of bleeding: no

## 2022-03-16 NOTE — Anesthesia Postprocedure Evaluation (Signed)
Anesthesia Post Note  Patient: Keith Schwartz  Procedure(s) Performed: TOTAL KNEE REVISION (Right: Knee)     Patient location during evaluation: PACU Anesthesia Type: General Level of consciousness: sedated and patient cooperative Pain management: pain level controlled Vital Signs Assessment: post-procedure vital signs reviewed and stable Respiratory status: spontaneous breathing Cardiovascular status: stable Anesthetic complications: no   No notable events documented.  Last Vitals:  Vitals:   03/16/22 2015 03/16/22 2019  BP: 120/75   Pulse: 79   Resp: 16 14  Temp:    SpO2: 93%     Last Pain:  Vitals:   03/16/22 2019  TempSrc:   PainSc: 6                  Lewie Loron

## 2022-03-16 NOTE — Interval H&P Note (Signed)
History and Physical Interval Note:  03/16/2022 1:41 PM  Keith Schwartz  has presented today for surgery, with the diagnosis of FAILED RIGHT TOTAL KNEE ARTHROPLASTY.  The various methods of treatment have been discussed with the patient and family. After consideration of risks, benefits and other options for treatment, the patient has consented to  Procedure(s): TOTAL KNEE REVISION (Right) as a surgical intervention.  The patient's history has been reviewed, patient examined, no change in status, stable for surgery.  I have reviewed the patient's chart and labs.  Questions were answered to the patient's satisfaction.     [name]

## 2022-03-16 NOTE — H&P (Signed)
TOTAL KNEE REVISION ADMISSION H&P  Patient is being admitted for right revision total knee arthroplasty.  Subjective:  Chief Complaint:right knee pain.  HPI: Keith Schwartz, 71 y.o. male, has a history of right primary TKA in 2012 by Dr. Francee Piccolo. He reports He reports gradually worsening pain in the knee over the past 3 years.   Patient Active Problem List   Diagnosis Date Noted   Biventricular ICD (implantable cardioverter-defibrillator) in place 01/21/2022   Chronic systolic heart failure (HCC) 02/05/2021   Coronary artery disease involving native coronary artery with angina pectoris (HCC) 12/22/2020   Gout 05/16/2017   Mixed hyperlipidemia 10/27/2015   Erectile dysfunction 10/27/2015   Testosterone deficiency 10/27/2015   Type 2 or unspecified type diabetes mellitus    Hypertension    Past Medical History:  Diagnosis Date   AICD (automatic cardioverter/defibrillator) present    Arthritis    CHF (congestive heart failure) (HCC)    Coronary artery disease    Diabetes mellitus without complication (HCC) 10/18/2000   History of kidney stones    Hypertension 10/18/2000   ICD (implantable cardioverter-defibrillator) in place    Ischemic cardiomyopathy    Pneumonia    walking   Sleep apnea    uses cpap at times    Past Surgical History:  Procedure Laterality Date   APPENDECTOMY  10/18/1968   BIV ICD INSERTION CRT-D N/A 10/20/2021   Procedure: BIV ICD INSERTION CRT-D;  Surgeon: Marinus Maw, MD;  Location: [facility] INVASIVE CV LAB;  Service: Cardiovascular;  Laterality: N/A;   HERNIA REPAIR     INTRAVASCULAR PRESSURE WIRE/FFR STUDY N/A 12/22/2020   Procedure: INTRAVASCULAR PRESSURE WIRE/FFR STUDY;  Surgeon: Tonny Bollman, MD;  Location: [facility] INVASIVE CV LAB;  Service: Cardiovascular;  Laterality: N/A;   JOINT REPLACEMENT  10/18/2010   rt knee   RIGHT/LEFT HEART CATH AND CORONARY ANGIOGRAPHY N/A 12/22/2020   Procedure: RIGHT/LEFT HEART CATH AND CORONARY ANGIOGRAPHY;   Surgeon: Tonny Bollman, MD;  Location: [facility] INVASIVE CV LAB;  Service: Cardiovascular;  Laterality: N/A;   thumb surgery Right    VASECTOMY  10/18/1984    No current facility-administered medications for this encounter.   Current Outpatient Medications  Medication Sig Dispense Refill Last Dose   acetaminophen (TYLENOL) 500 MG tablet Take 500-1,000 mg by mouth every 6 (six) hours as needed for moderate pain.      Ascorbic Acid (VITAMIN C) 1000 MG tablet Take 1,000 mg by mouth in the morning.      aspirin 81 MG EC tablet Take 81 mg by mouth in the morning.      betamethasone dipropionate (DIPROLENE) 0.05 % ointment Apply 1 application. topically daily as needed.      Cholecalciferol (QC VITAMIN D3) 50 MCG (2000 UT) TABS Take 2,000 Units by mouth in the morning.      empagliflozin (JARDIANCE) 25 MG TABS tablet Take 12.5 mg by mouth daily.      glimepiride (AMARYL) 4 MG tablet Take 4 mg by mouth daily.      hydrOXYzine (ATARAX) 10 MG tablet Take 10 mg by mouth every 8 (eight) hours as needed for itching.      magnesium oxide (MAG-OX) 400 MG tablet Take 400 mg by mouth daily.      melatonin 5 MG TABS Take 15 mg by mouth at bedtime as needed (sleep).      metFORMIN (GLUCOPHAGE) 1000 MG tablet TAKE 1 TABLET TWICE A DAY WITH MEALS 60 tablet 0    metoprolol succinate (TOPROL  XL) 50 MG 24 hr tablet Take 1 tablet (50 mg total) by mouth daily. 90 tablet 3    Multiple Vitamin (MULTIVITAMIN) tablet Take 1 tablet by mouth in the morning. MEN'S ONE A DAY 50+      nitroGLYCERIN (NITROSTAT) 0.4 MG SL tablet Place 1 tablet (0.4 mg total) under the tongue every 5 (five) minutes as needed for chest pain. 25 tablet 3    pramipexole (MIRAPEX) 1 MG tablet Take 1 mg by mouth at bedtime.      sacubitril-valsartan (ENTRESTO) 24-26 MG Take 1 tablet by mouth 2 (two) times daily. 180 tablet 3    Semaglutide,0.25 or 0.5MG /DOS, (OZEMPIC, 0.25 OR 0.5 MG/DOSE,) 2 MG/1.5ML SOPN Inject 1 mg into the skin every Thursday.       spironolactone (ALDACTONE) 25 MG tablet Take 0.5 tablets (12.5 mg total) by mouth daily. 45 tablet 3    vitamin E 1000 UNIT capsule Take 1,000 Units by mouth in the morning.      zinc gluconate 50 MG tablet Take 50 mg by mouth daily.      atorvastatin (LIPITOR) 80 MG tablet Take 1 tablet (80 mg total) by mouth daily. 30 tablet 0 Not Taking   doxycycline (VIBRAMYCIN) 100 MG capsule Take 1 capsule (100 mg total) by mouth 2 (two) times daily. (Patient not taking: Reported on 03/09/2022) 20 capsule 0    Glucose Blood (BLOOD GLUCOSE TEST STRIPS) STRP Please dispense based on patient and insurance preference. Use as directed to monitor FSBS 3x daily. Dx: W2585. 200 strip 2    Allergies  Allergen Reactions   Glipizide Hives and Rash   Penicillins Rash    Add as: Penicillin G/ Childhood    Social History   Tobacco Use   Smoking status: Never   Smokeless tobacco: Never  Substance Use Topics   Alcohol use: Not Currently    Alcohol/week: 1.0 standard drink    Types: 1 Cans of beer per week    Family History  Problem Relation Age of Onset   Arthritis Mother    Asthma Mother    Hearing loss Mother    Vision loss Mother    Heart disease Father    Hyperlipidemia Father    Hypertension Father    Early death Sister       Review of Systems  Constitutional:  Negative for chills and fever.  Respiratory:  Negative for cough and shortness of breath.   Cardiovascular:  Negative for chest pain.  Gastrointestinal:  Negative for nausea and vomiting.  Musculoskeletal:  Positive for arthralgias.     Objective:  Physical Exam Well nourished and well developed. General: Alert and oriented x3, cooperative and pleasant, no acute distress. Head: normocephalic, atraumatic, neck supple. Eyes: EOMI.  Musculoskeletal: Left knee exam: No palpable effusion, warmth erythema Neutral to slight varus Slight flexion contracture Tenderness over the medial and anterior aspect knee Flexion to 120 degrees  with tightness   Calves soft and nontender. Motor function intact in LE. Strength 5/5 LE bilaterally. Neuro: Distal pulses 2+. Sensation to light touch intact in LE.  Vital signs in last 24 hours:    Labs:  Estimated body mass index is 30.16 kg/m as calculated from the following:   Height as of 03/09/22: 6\' 4"  (1.93 m).   Weight as of 03/09/22: 112.4 kg.  Imaging Review Imaging: Standing AP of both knees and standing lateral of the right knee were ordered and reviewed. Review of the radiographs on the left show that  he has advanced medial compartment osteoarthritis with complete loss of joint space periarticular osteophytes and subchondral sclerosis. Focused evaluation of the right knee today reveals that he had what appeared to be relatively stable femoral and tibial components. His tibial component has a small stem. There is evidence of significant lytic lesion of the anterior aspect of the proximal tibia. Please note by the time that I have dictated this note I had a chance to review these radiographs with colleagues and have been told that this is an [employer] implant which has had significant problems with early polyethylene wear and osteolysis to the point that it has been recalled.    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   The patient history, physical examination, clinical judgment of the provider and imaging studies are consistent with aseptic loosening of the right knee(s), previous total knee arthroplasty. Revision total knee arthroplasty is deemed medically necessary. The treatment options including medical management, injection therapy, arthroscopy and revision arthroplasty were discussed at length. The risks and benefits of revision total knee arthroplasty were presented and reviewed. The risks due to aseptic loosening, infection, stiffness, patella tracking problems, thromboembolic complications  and other imponderables were discussed. The patient acknowledged the explanation, agreed to proceed with the plan and consent was signed. Patient is being admitted for inpatient treatment for surgery, pain control, PT, OT, prophylactic antibiotics, VTE prophylaxis, progressive ambulation and ADL's and discharge planning.The patient is planning to be discharged  home.  Therapy Plans: outpatient therapy at [facility] in [place] Disposition: Home with wife Planned DVT Prophylaxis: aspirin 81mg  BID DME needed: walker [email or web address] Cardiologist: Dr. Ladona Ridgelaylor, clearance received TXA: IV Allergies: glyburide - unknown , PCN - childhood rxn Anesthesia Concerns: none BMI: 29.7 Last HgbA1c: 7.9% on 5/12 (was out of meds for 3 weeks), BG has been 80-120 last week (?? Repeat)  Other: - Hx of systolic HF - CKD - Oxycodone, robaxin, tylenol - WANTS HIS IMPLANT PIECES  Rosalene BillingsAshley Nate Perri, PA-C Orthopedic Surgery [facility] [place] [phone]

## 2022-03-16 NOTE — Discharge Instructions (Signed)

## 2022-03-16 NOTE — Op Note (Unsigned)
NAME: Keith Schwartz, Keith Schwartz. MEDICAL RECORD NO: [id number] ACCOUNT NO: [id number] DATE OF BIRTH: 04-04-1951 FACILITY: [facility] LOCATION: [id number] PHYSICIAN: Madlyn Frankel. 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  FINDINGS:  Please see dictated operative note for details of the findings including in particularly related to the polyethylene wear and loss of bone.  PROCEDURE:  Revision right total knee arthroplasty.  COMPONENTS USED:  DePuy Attune revision knee system with a size 7 femur with a 16 x 80 mm cemented stem with a cement restrictor placed.  The tibia side with a size 7 with an 18 x 110 mm press-fit stem with a 69 press-fit tibial sleeve and a 41 patellar  button.  I used a 7 x 16 mm revision tibial insert.  SURGEON:  Madlyn Frankel. Charlann Boxer, MD  ASSISTANT:  Rosalene Billings, PA-C.  Note that Ms. Domenic Schwab was present for the entirety of the case from preoperative positioning, preoperative management of the operative extremity, general facilitation of the case and primary wound closure.  ANESTHESIA:  Regional plus general.  BLOOD LOSS:  Less than 100 mL.  TOURNIQUET:  Up for a total of 79 minutes at 225 mmHg.  DRAINS:  None.  COMPLICATIONS:  None.  INDICATIONS FOR PROCEDURE:  The patient is a 71 year old male seen and evaluated for right knee pain.  He had a history of a right total knee arthroplasty performed with [employer] components approximately 10 or so years ago.  He presented to the office  with pain.  Radiographs had revealed significant osteolytic changes to the proximal tibia and to a lesser extent the distal femur.  Radiographs have indicated increased uptake concerning for loosening correlating with his pain.  In addition, he has   advanced left knee osteoarthritis.  Upon identifying the source of his pain is osteolytic changes related to polyethylene wear associated with a recalled implant, he wished to proceed with a revision surgery.  The primary indication for this was pain and  effect on his quality of life and function.  We reviewed the risk of infection, DVT, component failure, stiffness, and need for future surgeries.  Based on the osteolytic pattern identified, particularly over the anterior aspect of the tibia, there was concerns about adequate bone stock and healing  leading to potential increased risk for component failure, need for future surgery.  After reviewing the risks, he wished to proceed for the improvement of his pain and quality of life.  PROCEDURE IN DETAIL:  The patient was brought to the operative theater.  Once adequate anesthesia, preoperative antibiotics, Ancef administered as well as tranexamic acid and Decadron.  He was positioned supine with a right thigh tourniquet placed.  The  right lower extremity was prepped and draped in sterile fashion.  A timeout was performed identifying the patient, planned procedure, and extremity.  The leg was exsanguinated and tourniquet elevated to 225 mmHg.  His old incision was excised.  Soft  tissue planes created.  I then made a median arthrotomy.  He had old Ethibond sutures, which were removed.  Clear synovial fluid was encountered.  As we exposed the knee, I found significant destruction to the anterior proximal tibia proximal to the  tibial tubercle at the patella insertion at the tibial tubercle, the tibial tubercle was intact.  The cortical  bone at this point was soft and nonstructural in nature.  There was noted to be significant osteolytic area over the anterior aspect of tibia as anticipated radiographically that I was able to  suction out.  I first performed a significant synovectomy over the medial, lateral, and suprapatellar region.  This included around  the patella.  I initially preserved the patella for the procedural portion and addressed the patella revision near the  end.  Following this, extensive synovectomy identifying remaining bone stock, I carefully performed a proximal medial peel not knowing what kind of bone which could be there present based on the anterior aspect of the tibia.  The lateral aspect of tibia  was noted to be significantly affected as well.  I knew this based on the fact it was hard to place a lateral based retractor because there was no structural bone laterally.  Once I had the knee debrided, I first attended to the tibial component.  I used  a regular saw blade and undermined the cut surface medially and laterally.  I then was able to use an osteotome and removed the tibial component.  The remaining cement mantle was removed.  Once this was done, I was able to fully visualize the extent of  the damage to the proximal tibia.  We removed all nonviable tissue, again noting that there was significant loss of bone to the proximal lateral tibia as well as the nonstructural nature of the anterior bone.  The posterior aspect of tibia and medial  aspects were intact.  I then began reaming and reamed up to 18 mm on the tibial side.  I then used a canal brush irrigator and irrigated out the tibial side.  I then broached.  It was not until we get to the largest sleeve broach available, the 69 and we  got any purchase at all.  I then placed this and then placed a trial tray on top of this.  I now then attended to the femur.  Femoral component was removed using a thin ACL saw to undermine the bone cement interface.  This was done without significant  bone loss.  There was significant osteolytic bone damage to the medial femoral condyle, almost an apple core appearance.  The medial epicondyle was attached, but flimsy as it correlated with radiographs.  Once the distal femur was removed and all  debrided, I then opened up the femoral canal  and reamed up again at this point up to 18 mm to allow for cement mantle.  We irrigated the canal with canal brush irrigator.  I then prepared the femoral component per protocol using a size 7.  We then placed  a 7 trial component with a 16 x 80 cemented stem.  With this trial components in placed, I found that a 16-mm insert allowed for full knee extension and flexion.  I was then able to increase the tibial aspect based on the lack of bone and the fact we  had maximized the tibial sleeve.  Once the trial components were placed, we then focused on the patella.  I removed the patellar button without significant bone loss or complication.  I then reamed holes for 41 patellar button.  At this point, all trial  components were removed.  We injected the synovial capsule junction of the knee with 0.25% Marcaine with epinephrine, 1 mL of Toradol and saline.  I then irrigated the knee with normal saline solution.  We placed a cement restrictor into the  distal  femur.  The final components were opened and configured on the back table under direct supervision.  At this point, I elected to use a press-fit tibial side to provide fixation distally.  I did not ream when above the 18 mm to get the best purchase as  possible.  Once the tibial component was made, I did impact this and it had a good firm endpoint and was secure.  I was considering placing bone graft, but the defects of the anterior and lateral aspect of tibia were uncontained.  I also thought about  placing some cement in this area, however, given the uncontained nature, I did not want to have this extruded into the lateral compartment of the leg.  We then mixed cement.  The distal femur was dried and prepared.  I did inject some cement using a  cement gun in the proximal aspect of the tibia, but not anywhere near the soft tissues of the lateral compartment.  I then cemented the femoral component in place and the knee was brought to extension with a 16-mm  insert to allow the cement to cure.  The  patellar button was cemented into place.  Once the cement had fully cured, excessive cement was removed throughout the knee.  I elected to use the 16 mm revision insert based on the nature of his medial epicondyle.  The final insert was placed in the  tibia.  The knee was reduced.  We reirrigated the knee.  At this point, the extensor mechanism was reapproximated using #1 Vicryl and #1 Stratafix suture.  The remaining wound was closed in layers using 2-0 Vicryl and then a Monocryl stitch.  The knee  was clean, dry and dressed sterilely using surgical glue and Aquacel dressing.  He was then brought to the recovery room in stable condition, tolerated the procedure well.  Findings reviewed with his wife.  Postoperatively, I am going to have him be  partial weightbearing for 6-12 weeks to make certain we get adequate stability and healing of the tibial side, particularly.  He will be able to work on range of motion exercises.  We did clean off his components and gave it to him as he wished to have  them due to concerns regarding the implant and ongoing litigation.   NIK D: 03/16/2022 6:09:24 pm T: 03/16/2022 10:37:00 pm  JOB: [id number]

## 2022-03-16 NOTE — Progress Notes (Signed)
Assisted Dr. Germeroth with right, adductor canal, ultrasound guided block. Side rails up, monitors on throughout procedure. See vital signs in flow sheet. Tolerated Procedure well. ? ?

## 2022-03-16 NOTE — Transfer of Care (Signed)
Immediate Anesthesia Transfer of Care Note  Patient: Keith Schwartz  Procedure(s) Performed: TOTAL KNEE REVISION (Right: Knee)  Patient Location: PACU  Anesthesia Type:General  Level of Consciousness: drowsy  Airway & Oxygen Therapy: Patient Spontanous Breathing and Patient connected to face mask oxygen  Post-op Assessment: Report given to RN and Post -op Vital signs reviewed and stable  Post vital signs: Reviewed and stable  Last Vitals:  Vitals Value Taken Time  BP 119/60   Temp    Pulse 66   Resp 12   SpO2 100     Last Pain:  Vitals:   03/16/22 1422  TempSrc:   PainSc: 0-No pain      Patients Stated Pain Goal: 6 (03/16/22 1250)  Complications: No notable events documented.

## 2022-03-16 NOTE — Anesthesia Procedure Notes (Addendum)
Anesthesia Regional Block: Adductor canal block   Pre-Anesthetic Checklist: , timeout performed,  Correct Patient, Correct Site, Correct Laterality,  Correct Procedure, Correct Position, site marked,  Risks and benefits discussed,  Surgical consent,  Pre-op evaluation,  At surgeon's request and post-op pain management  Laterality: Lower and Right  Prep: chloraprep       Needles:  Injection technique: Single-shot  Needle Type: Stimiplex     Needle Length: 9cm  Needle Gauge: 21     Additional Needles:   Procedures:,,,, ultrasound used (permanent image in chart),,    Narrative:  Start time: 03/16/2022 2:05 PM End time: 03/16/2022 2:25 PM Injection made incrementally with aspirations every 5 mL.  Performed by: Personally  Anesthesiologist: Lewie Loron, MD  Additional Notes: BP cuff, EKG monitors applied. Sedation begun. Artery and nerve location verified with ultrasound. Anesthetic injected incrementally (20ml), slowly, and after negative aspirations under direct u/s guidance. Good fascial/perineural spread. Tolerated well.

## 2022-03-17 ENCOUNTER — Inpatient Hospital Stay (HOSPITAL_COMMUNITY): Payer: No Typology Code available for payment source

## 2022-03-17 ENCOUNTER — Encounter (HOSPITAL_COMMUNITY): Payer: Self-pay | Admitting: Orthopedic Surgery

## 2022-03-17 LAB — BASIC METABOLIC PANEL
Anion gap: 10 (ref 5–15)
BUN: 42 mg/dL — ABNORMAL HIGH (ref 8–23)
CO2: 22 mmol/L (ref 22–32)
Calcium: 9 mg/dL (ref 8.9–10.3)
Chloride: 105 mmol/L (ref 98–111)
Creatinine, Ser: 1.54 mg/dL — ABNORMAL HIGH (ref 0.61–1.24)
GFR, Estimated: 48 mL/min — ABNORMAL LOW (ref >60)
Glucose, Bld: 269 mg/dL — ABNORMAL HIGH (ref 70–99)
Potassium: 5.2 mmol/L — ABNORMAL HIGH (ref 3.5–5.1)
Sodium: 137 mmol/L (ref 135–145)

## 2022-03-17 LAB — CBC
HCT: 41.7 % (ref 39.0–52.0)
Hemoglobin: 13.8 g/dL (ref 13.0–17.0)
MCH: 30.3 pg (ref 26.0–34.0)
MCHC: 33.1 g/dL (ref 30.0–36.0)
MCV: 91.6 fL (ref 80.0–100.0)
Platelets: 176 10*3/uL (ref 150–400)
RBC: 4.55 MIL/uL (ref 4.22–5.81)
RDW: 14.2 % (ref 11.5–15.5)
WBC: 13.2 10*3/uL — ABNORMAL HIGH (ref 4.0–10.5)
nRBC: 0 % (ref 0.0–0.2)

## 2022-03-17 LAB — GLUCOSE, CAPILLARY
Glucose-Capillary: 219 mg/dL — ABNORMAL HIGH (ref 70–99)
Glucose-Capillary: 227 mg/dL — ABNORMAL HIGH (ref 70–99)

## 2022-03-17 MED ORDER — OXYCODONE HCL 5 MG PO TABS
5.0000 mg | ORAL_TABLET | ORAL | 0 refills | Status: DC | PRN
Start: 2022-03-17 — End: 2023-04-29

## 2022-03-17 MED ORDER — ASPIRIN 81 MG PO CHEW: 81.0000 mg | CHEWABLE_TABLET | Freq: Two times a day (BID) | ORAL | 0 refills | Status: AC

## 2022-03-17 MED ORDER — POLYETHYLENE GLYCOL 3350 17 G PO PACK: 17.0000 g | PACK | Freq: Every day | ORAL | 0 refills | Status: DC | PRN

## 2022-03-17 MED ORDER — METHOCARBAMOL 500 MG PO TABS: 500.0000 mg | ORAL_TABLET | Freq: Four times a day (QID) | ORAL | 0 refills | Status: DC | PRN

## 2022-03-17 MED ORDER — CEFADROXIL 500 MG PO CAPS: 500.0000 mg | ORAL_CAPSULE | Freq: Two times a day (BID) | ORAL | 0 refills | Status: AC

## 2022-03-17 MED ORDER — DOCUSATE SODIUM 100 MG PO CAPS: 100.0000 mg | ORAL_CAPSULE | Freq: Two times a day (BID) | ORAL | 0 refills | Status: DC

## 2022-03-17 NOTE — TOC Transition Note (Signed)
Transition of Care [facility]) - CM/SW Discharge Note  Patient Details  Name: Keith Schwartz MRN: [id number] Date of Birth: [birth year]  [facility]) CM/SW Contact:  Sherie Don, LCSW Phone Number: 03/17/2022, 10:02 AM  Clinical Narrative: Patient is expected to discharge after working with PT. CSW met with patient and wife to review discharge plan and needs. Patient will discharge home with OPPT at [facility] in [place]. Patient will need a rolling walker, which was delivered to the room by [employer]. TOC signing off.  Final next level of care: OP Rehab Barriers to Discharge: No Barriers Identified  Patient Goals and CMS Choice Patient states their goals for this hospitalization and ongoing recovery are:: Discharge home with OPPT at [email or web address].gov Compare Post Acute Care list provided to:: Patient Choice offered to / list presented to : Patient  Discharge Plan and Services       DME Arranged: Walker rolling DME Agency: [employer] Representative spoke with at DME Agency: Prearranged in orthopedist's office  Readmission Risk Interventions     View : No data to display.

## 2022-03-17 NOTE — Plan of Care (Signed)
Pt ready to DC home with wife. 

## 2022-03-17 NOTE — Evaluation (Signed)
Physical Therapy Evaluation Patient Details Name: Keith Schwartz MRN: [id number] DOB: 09/12/1951 Today's Date: 03/17/2022  History of Present Illness  Pt is a 71 year old male s/p right total knee arthoplasty revision due to recalled hardware on 03/16/22.  Clinical Impression  Pt is s/p TKA revision resulting in the deficits listed below (see PT Problem List).  Pt will benefit from skilled PT to increase their independence and safety with mobility to allow discharge to the venue listed below.  Pt ambulated in hallway and performed LE exercises.  Pt anticipates d/c home after second session.  Spouse present and observed session.  Pt educated on PWB status.      Recommendations for follow up therapy are one component of a multi-disciplinary discharge planning process, led by the attending physician.  Recommendations may be updated based on patient status, additional functional criteria and insurance authorization.  Follow Up Recommendations Follow physician's recommendations for discharge plan and follow up therapies    Assistance Recommended at Discharge    Patient can return home with the following       Equipment Recommendations Rolling walker (2 wheels)  Recommendations for Other Services       Functional Status Assessment Patient has had a recent decline in their functional status and demonstrates the ability to make significant improvements in function in a reasonable and predictable amount of time.     Precautions / Restrictions Precautions Precautions: Knee Restrictions Weight Bearing Restrictions: Yes RLE Weight Bearing: Partial weight bearing RLE Partial Weight Bearing Percentage or Pounds: 50%      Mobility  Bed Mobility Overal bed mobility: Needs Assistance Bed Mobility: Supine to Sit     Supine to sit: Supervision, HOB elevated          Transfers Overall transfer level: Needs assistance Equipment used: Rolling walker (2 wheels) Transfers: Sit to/from  Stand Sit to Stand: Min guard           General transfer comment: verbal cues for safe technique    Ambulation/Gait Ambulation/Gait assistance: Min guard Gait Distance (Feet): 160 Feet Assistive device: Rolling walker (2 wheels) Gait Pattern/deviations: Step-to pattern, Decreased stance time - right, Antalgic       General Gait Details: verbal cues for PWB, sequence, RW positioning  Stairs            Wheelchair Mobility    Modified Rankin (Stroke Patients Only)       Balance                                             Pertinent Vitals/Pain Pain Assessment Pain Assessment: 0-10 Pain Score: 4  Pain Location: right knee Pain Descriptors / Indicators: Aching, Sore Pain Intervention(s): Monitored during session, Repositioned    Home Living Family/patient expects to be discharged to:: Private residence Living Arrangements: Spouse/significant other   Type of Home: House Home Access: Ramped entrance;Stairs to enter   Technical brewer of Steps: 3-4   Home Layout: One level Home Equipment: None      Prior Function Prior Level of Function : Independent/Modified Independent                     Hand Dominance        Extremity/Trunk Assessment        Lower Extremity Assessment Lower Extremity Assessment: RLE deficits/detail RLE Deficits / Details: good quad  contraction, able to perform ankle pumps, right knee AAROM approx 6-80*       Communication   Communication: No difficulties  Cognition Arousal/Alertness: Awake/alert Behavior During Therapy: WFL for tasks assessed/performed Overall Cognitive Status: Within Functional Limits for tasks assessed                                          General Comments      Exercises Total Joint Exercises Ankle Circles/Pumps: PROM, Both, 10 reps Quad Sets: AROM, Right, 10 reps Heel Slides: AAROM, Right, 10 reps Hip ABduction/ADduction: AAROM, Right, 10  reps Straight Leg Raises: AAROM, Right, 10 reps Knee Flexion: AAROM, Seated, Right, 10 reps   Assessment/Plan    PT Assessment Patient needs continued PT services  PT Problem List Decreased mobility;Decreased activity tolerance;Decreased strength;Decreased range of motion;Decreased knowledge of use of DME;Decreased knowledge of precautions       PT Treatment Interventions Gait training;Balance training;Therapeutic exercise;DME instruction;Functional mobility training;Therapeutic activities;Patient/family education    PT Goals (Current goals can be found in the Care Plan section)  Acute Rehab PT Goals PT Goal Formulation: With patient Time For Goal Achievement: 03/20/22 Potential to Achieve Goals: Good    Frequency 7X/week     Co-evaluation               AM-PAC PT "6 Clicks" Mobility  Outcome Measure Help needed turning from your back to your side while in a flat bed without using bedrails?: A Little Help needed moving from lying on your back to sitting on the side of a flat bed without using bedrails?: A Little Help needed moving to and from a bed to a chair (including a wheelchair)?: A Little Help needed standing up from a chair using your arms (e.g., wheelchair or bedside chair)?: A Little Help needed to walk in hospital room?: A Little Help needed climbing 3-5 steps with a railing? : A Little 6 Click Score: 18    End of Session Equipment Utilized During Treatment: Gait belt Activity Tolerance: Patient tolerated treatment well Patient left: in chair;with call bell/phone within reach;with family/visitor present (pt aware to use call bell and call for assist)   PT Visit Diagnosis: Difficulty in walking, not elsewhere classified (R26.2)    Time: [id number] PT Time Calculation (min) (ACUTE ONLY): 23 min   Charges:   PT Evaluation $PT Eval Low Complexity: 1 Low PT Treatments $Therapeutic Exercise: 8-22 mins      Jannette Spanner PT, DPT Acute Rehabilitation  Services Pager: [phone] Office: [place] 03/17/2022, 12:31 PM

## 2022-03-17 NOTE — Progress Notes (Signed)
Physical Therapy Treatment Patient Details Name: Keith Schwartz MRN: [id number] DOB: 1951/09/28 Today's Date: 03/17/2022   History of Present Illness Pt is a 70 year old male s/p right total knee arthoplasty revision due to recalled hardware on 03/16/22.    PT Comments    Pt ambulated in hallway and practiced safe stair technique.  Pt provided with HEP handout and also reviewed PWB and maintaining PWB with mobilizing.  Pt had no further questions and feels ready for d/c home today.    Recommendations for follow up therapy are one component of a multi-disciplinary discharge planning process, led by the attending physician.  Recommendations may be updated based on patient status, additional functional criteria and insurance authorization.  Follow Up Recommendations  Follow physician's recommendations for discharge plan and follow up therapies     Assistance Recommended at Discharge    Patient can return home with the following     Equipment Recommendations  Rolling walker (2 wheels)    Recommendations for Other Services       Precautions / Restrictions Precautions Precautions: Knee Restrictions Weight Bearing Restrictions: Yes RLE Weight Bearing: Partial weight bearing RLE Partial Weight Bearing Percentage or Pounds: 50%     Mobility  Bed Mobility Overal bed mobility: Needs Assistance Bed Mobility: Supine to Sit     Supine to sit: Supervision, HOB elevated     General bed mobility comments: pt in recliner    Transfers Overall transfer level: Needs assistance Equipment used: Rolling walker (2 wheels) Transfers: Sit to/from Stand Sit to Stand: Min guard           General transfer comment: verbal cues for safe technique and having walker prior to standing, PWB status    Ambulation/Gait Ambulation/Gait assistance: Min guard Gait Distance (Feet): 320 Feet Assistive device: Rolling walker (2 wheels) Gait Pattern/deviations: Step-to pattern, Decreased stance  time - right, Antalgic       General Gait Details: verbal cues for PWB, sequence, RW positioning   Stairs Stairs: Yes Stairs assistance: Min guard Stair Management: Step to pattern, Forwards, Two rails Number of Stairs: 4 General stair comments: verbal cues for safety and sequence, cues for PWB status, spouse present and observed   Wheelchair Mobility    Modified Rankin (Stroke Patients Only)       Balance                                            Cognition Arousal/Alertness: Awake/alert Behavior During Therapy: WFL for tasks assessed/performed Overall Cognitive Status: Within Functional Limits for tasks assessed                                          Exercises Total Joint Exercises Ankle Circles/Pumps: PROM, Both, 10 reps Quad Sets: AROM, Right, 10 reps Heel Slides: AAROM, Right, 10 reps Hip ABduction/ADduction: AAROM, Right, 10 reps Straight Leg Raises: AAROM, Right, 10 reps Knee Flexion: AAROM, Seated, Right, 10 reps    General Comments        Pertinent Vitals/Pain Pain Assessment Pain Assessment: 0-10 Pain Score: 5  Pain Location: right knee Pain Descriptors / Indicators: Aching, Sore Pain Intervention(s): Repositioned, Monitored during session    Home Living Family/patient expects to be discharged to:: Private residence Living Arrangements: Spouse/significant other  Type of Home: House Home Access: Ramped entrance;Stairs to enter   Entrance Stairs-Number of Steps: 3-4   Home Layout: One level Home Equipment: None      Prior Function            PT Goals (current goals can now be found in the care plan section) Acute Rehab PT Goals PT Goal Formulation: With patient Time For Goal Achievement: 03/20/22 Potential to Achieve Goals: Good Progress towards PT goals: Progressing toward goals    Frequency    7X/week      PT Plan Current plan remains appropriate    Co-evaluation               AM-PAC PT "6 Clicks" Mobility   Outcome Measure  Help needed turning from your back to your side while in a flat bed without using bedrails?: A Little Help needed moving from lying on your back to sitting on the side of a flat bed without using bedrails?: A Little Help needed moving to and from a bed to a chair (including a wheelchair)?: A Little Help needed standing up from a chair using your arms (e.g., wheelchair or bedside chair)?: A Little Help needed to walk in hospital room?: A Little Help needed climbing 3-5 steps with a railing? : A Little 6 Click Score: 18    End of Session Equipment Utilized During Treatment: Gait belt Activity Tolerance: Patient tolerated treatment well Patient left: in chair;with call bell/phone within reach;with family/visitor present;with nursing/sitter in room Nurse Communication: Mobility status PT Visit Diagnosis: Difficulty in walking, not elsewhere classified (R26.2)     Time: 7510-2585 PT Time Calculation (min) (ACUTE ONLY): 17 min  Charges:  $Gait Training: 8-22 mins $Therapeutic Exercise: 8-22 mins                     Thomasene Mohair PT, DPT Acute Rehabilitation Services Pager: [phone] Office: [phone]    Janan Halter [place] 03/17/2022, 1:36 PM

## 2022-03-17 NOTE — Progress Notes (Signed)
   Subjective: 1 Day Post-Op Procedure(s) (LRB): TOTAL KNEE REVISION (Right) Patient reports pain as mild.   Patient seen in rounds with Dr. Alvan Dame. Patient is well, and has had no acute complaints or problems. No acute events overnight. Voiding without difficulty. Wife at the bedside this morning. Patient is very motivated to get home today.  We will start therapy today.   Objective: Vital signs in last 24 hours: Temp:  [97.2 F (36.2 C)-98.7 F (37.1 C)] 97.8 F (36.6 C) (05/31 0627) Pulse Rate:  [59-80] 80 (05/31 0627) Resp:  [9-20] 17 (05/31 0627) BP: (102-138)/(57-79) 102/57 (05/31 0627) SpO2:  [91 %-100 %] 97 % (05/31 0627) Weight:  [112.4 kg] 112.4 kg (05/30 1250)  Intake/Output from previous day:  Intake/Output Summary (Last 24 hours) at 03/17/2022 0838 Last data filed at 03/17/2022 0300 Gross per 24 hour  Intake 2721.25 ml  Output 625 ml  Net 2096.25 ml     Intake/Output this shift: No intake/output data recorded.  Labs: Recent Labs    03/17/22 0332  HGB 13.8   Recent Labs    03/17/22 0332  WBC 13.2*  RBC 4.55  HCT 41.7  PLT 176   Recent Labs    03/17/22 0332  NA 137  K 5.2*  CL 105  CO2 22  BUN 42*  CREATININE 1.54*  GLUCOSE 269*  CALCIUM 9.0   No results for input(s): LABPT, INR in the last 72 hours.  Exam: General - Patient is Alert and Oriented Extremity - Neurologically intact Sensation intact distally Intact pulses distally Dorsiflexion/Plantar flexion intact Dressing - dressing C/D/I Motor Function - intact, moving foot and toes well on exam.   Past Medical History:  Diagnosis Date   AICD (automatic cardioverter/defibrillator) present    Arthritis    CHF (congestive heart failure) (HCC)    Coronary artery disease    Diabetes mellitus without complication ([place]) [id number]   History of kidney stones    Hypertension 10/18/2000   ICD (implantable cardioverter-defibrillator) in place    Ischemic cardiomyopathy    Pneumonia     walking   Sleep apnea    uses cpap at times    Assessment/Plan: 1 Day Post-Op Procedure(s) (LRB): TOTAL KNEE REVISION (Right) Principal Problem:   S/P revision of total knee, right  Estimated body mass index is 30.16 kg/m as calculated from the following:   Height as of this encounter: 6\' 4"  (1.93 m).   Weight as of this encounter: 112.4 kg. Advance diet Up with therapy D/C IV fluids   DVT Prophylaxis - Aspirin PWB 50% RLE.   Hgb stable at 13.8 this AM. CKD : Cr. 1.54 this AM. Will avoid NSAIDs.  We will obtain a post-op x-ray of the knee for baseline evaluation.   Plan is to go Home after hospital stay. Plan for discharge today following 1-2 sessions of PT as long as they are meeting their goals. Patient is scheduled for OPPT. Follow up in the office in 2 weeks.   Griffith Citron, PA-C Orthopedic Surgery [phone] 03/17/2022, 8:38 AM

## 2022-03-19 DIAGNOSIS — Z96651 Presence of right artificial knee joint: Secondary | ICD-10-CM | POA: Diagnosis not present

## 2022-03-19 DIAGNOSIS — Z471 Aftercare following joint replacement surgery: Secondary | ICD-10-CM | POA: Diagnosis not present

## 2022-03-19 DIAGNOSIS — M6281 Muscle weakness (generalized): Secondary | ICD-10-CM | POA: Diagnosis not present

## 2022-03-19 DIAGNOSIS — M25561 Pain in right knee: Secondary | ICD-10-CM | POA: Diagnosis not present

## 2022-03-22 ENCOUNTER — Ambulatory Visit
Admission: EM | Admit: 2022-03-22 | Discharge: 2022-03-22 | Disposition: A | Discharge status: Home / Self Care | Payer: Medicare Other | Attending: Nurse Practitioner | Admitting: Nurse Practitioner

## 2022-03-22 DIAGNOSIS — J029 Acute pharyngitis, unspecified: Secondary | ICD-10-CM

## 2022-03-22 MED ORDER — CEPHALEXIN 500 MG PO CAPS: 500.0000 mg | ORAL_CAPSULE | Freq: Every day | ORAL | 0 refills | Status: AC

## 2022-03-22 MED ORDER — LIDOCAINE VISCOUS HCL 2 % MT SOLN: 15.0000 mL | OROMUCOSAL | 0 refills | Status: AC | PRN

## 2022-03-22 NOTE — Discharge Instructions (Addendum)
-   The back of your throat (Uvula) looks irritated; since it has been hurting for so long, I would like for you to start the antibiotics.  Please start taking the Keflex 500 mg daily for the throat.  If your symptoms persist or worsen despite treatment, please follow up with your primary care provider or ENT provider.  Contact information is listed below.

## 2022-03-22 NOTE — ED Provider Notes (Signed)
[facility]    CSN: [id number] Arrival date & time: 03/22/22  1334      History   Chief Complaint Chief Complaint  Patient presents with   Sore Throat    HPI Keith Schwartz is a 71 y.o. male.   Patient presents with wife for sore throat that started immediately after knee surgery and removing the breathing tube.  Reports he has tried "everything" at home including salt water gargles, chloraseptic spray, and nothing has been helping.  Denies fever, cough, congestion, ear pain or drainage, chest pain, shortness of breath, runny nose, abdominal pain, nausea/vomiting, rash, fatigue.   Past Medical History:  Diagnosis Date   AICD (automatic cardioverter/defibrillator) present    Arthritis    CHF (congestive heart failure) (HCC)    Coronary artery disease    Diabetes mellitus without complication (HCC) 10/18/2000   History of kidney stones    Hypertension 10/18/2000   ICD (implantable cardioverter-defibrillator) in place    Ischemic cardiomyopathy    Pneumonia    walking   Sleep apnea    uses cpap at times    Patient Active Problem List   Diagnosis Date Noted   S/P revision of total knee, right 03/16/2022   Biventricular ICD (implantable cardioverter-defibrillator) in place 01/21/2022   Chronic systolic heart failure (HCC) 02/05/2021   Coronary artery disease involving native coronary artery with angina pectoris (HCC) 12/22/2020   Gout 05/16/2017   Mixed hyperlipidemia 10/27/2015   Erectile dysfunction 10/27/2015   Testosterone deficiency 10/27/2015   Type 2 or unspecified type diabetes mellitus    Hypertension     Past Surgical History:  Procedure Laterality Date   APPENDECTOMY  10/18/1968   BIV ICD INSERTION CRT-D N/A 10/20/2021   Procedure: BIV ICD INSERTION CRT-D;  Surgeon: Marinus Maw, MD;  Location: [facility] INVASIVE CV LAB;  Service: Cardiovascular;  Laterality: N/A;   HERNIA REPAIR     INTRAVASCULAR PRESSURE WIRE/FFR STUDY N/A 12/22/2020    Procedure: INTRAVASCULAR PRESSURE WIRE/FFR STUDY;  Surgeon: Tonny Bollman, MD;  Location: [facility] INVASIVE CV LAB;  Service: Cardiovascular;  Laterality: N/A;   JOINT REPLACEMENT  10/18/2010   rt knee   RIGHT/LEFT HEART CATH AND CORONARY ANGIOGRAPHY N/A 12/22/2020   Procedure: RIGHT/LEFT HEART CATH AND CORONARY ANGIOGRAPHY;  Surgeon: Tonny Bollman, MD;  Location: [facility] INVASIVE CV LAB;  Service: Cardiovascular;  Laterality: N/A;   thumb surgery Right    TOTAL KNEE REVISION Right 03/16/2022   Procedure: TOTAL KNEE REVISION;  Surgeon: Durene Romans, MD;  Location: [facility] ORS;  Service: Orthopedics;  Laterality: Right;   VASECTOMY  10/18/1984       Home Medications    Prior to Admission medications   Medication Sig Start Date End Date Taking? Authorizing Provider  cephALEXin (KEFLEX) 500 MG capsule Take 1 capsule (500 mg total) by mouth daily for 10 days. 03/22/22 04/01/22 Yes Cathlean Marseilles A, NP  lidocaine (XYLOCAINE) 2 % solution Use as directed 15 mLs in the mouth or throat as needed for mouth pain. 03/22/22  Yes Valentino Nose, NP  acetaminophen (TYLENOL) 500 MG tablet Take 500-1,000 mg by mouth every 6 (six) hours as needed for moderate pain.    [provider]  Ascorbic Acid (VITAMIN C) 1000 MG tablet Take 1,000 mg by mouth in the morning.    [provider]  aspirin 81 MG chewable tablet Chew 1 tablet (81 mg total) by mouth 2 (two) times daily for 28 days. 03/17/22 04/14/22  Rosalene Billings  R, PA-C  atorvastatin (LIPITOR) 80 MG tablet Take 1 tablet (80 mg total) by mouth daily. 11/27/21   Little Ishikawa, MD  betamethasone dipropionate (DIPROLENE) 0.05 % ointment Apply 1 application. topically daily as needed.    [provider]  cefadroxil (DURICEF) 500 MG capsule Take 1 capsule (500 mg total) by mouth 2 (two) times daily for 7 days. 03/17/22 03/24/22  Cassandria Anger, PA-C  Cholecalciferol (QC VITAMIN D3) 50 MCG (2000 UT) TABS Take 2,000 Units by mouth in the  morning.    [provider]  docusate sodium (COLACE) 100 MG capsule Take 1 capsule (100 mg total) by mouth 2 (two) times daily. 03/17/22   Cassandria Anger, PA-C  empagliflozin (JARDIANCE) 25 MG TABS tablet Take 12.5 mg by mouth daily.    [provider]  glimepiride (AMARYL) 4 MG tablet Take 4 mg by mouth daily. 12/28/17   [provider]  Glucose Blood (BLOOD GLUCOSE TEST STRIPS) STRP Please dispense based on patient and insurance preference. Use as directed to monitor FSBS 3x daily. Dx: V2536. 07/17/21   Donita Brooks, MD  hydrOXYzine (ATARAX) 10 MG tablet Take 10 mg by mouth every 8 (eight) hours as needed for itching.    [provider]  magnesium oxide (MAG-OX) 400 MG tablet Take 400 mg by mouth daily.    [provider]  melatonin 5 MG TABS Take 15 mg by mouth at bedtime as needed (sleep).    [provider]  metFORMIN (GLUCOPHAGE) 1000 MG tablet TAKE 1 TABLET TWICE A DAY WITH MEALS 12/06/16   Allayne Butcher B, PA-C  methocarbamol (ROBAXIN) 500 MG tablet Take 1 tablet (500 mg total) by mouth every 6 (six) hours as needed for muscle spasms. 03/17/22   Cassandria Anger, PA-C  metoprolol succinate (TOPROL XL) 50 MG 24 hr tablet Take 1 tablet (50 mg total) by mouth daily. 09/24/21   Little Ishikawa, MD  Multiple Vitamin (MULTIVITAMIN) tablet Take 1 tablet by mouth in the morning. MEN'S ONE A DAY 50+    [provider]  nitroGLYCERIN (NITROSTAT) 0.4 MG SL tablet Place 1 tablet (0.4 mg total) under the tongue every 5 (five) minutes as needed for chest pain. 11/12/20 10/16/24  Little Ishikawa, MD  oxyCODONE (OXY IR/ROXICODONE) 5 MG immediate release tablet Take 1-2 tablets (5-10 mg total) by mouth every 4 (four) hours as needed for moderate pain or severe pain. 03/17/22   Cassandria Anger, PA-C  polyethylene glycol (MIRALAX / GLYCOLAX) 17 g packet Take 17 g by mouth daily as needed for mild constipation. 03/17/22   Cassandria Anger, PA-C  pramipexole (MIRAPEX) 1 MG tablet Take 1 mg by mouth at bedtime. 08/01/20   [provider]  sacubitril-valsartan (ENTRESTO) 24-26 MG Take 1 tablet by mouth 2 (two) times daily. 08/25/21   Little Ishikawa, MD  Semaglutide,0.25 or 0.5MG /DOS, (OZEMPIC, 0.25 OR 0.5 MG/DOSE,) 2 MG/1.5ML SOPN Inject 1 mg into the skin every Thursday.    [provider]  spironolactone (ALDACTONE) 25 MG tablet Take 0.5 tablets (12.5 mg total) by mouth daily. 09/24/21 03/03/22  Little Ishikawa, MD  vitamin E 1000 UNIT capsule Take 1,000 Units by mouth in the morning.    [provider]  zinc gluconate 50 MG tablet Take 50 mg by mouth daily.    [provider]    Family History Family History  Problem Relation Age of Onset   Arthritis Mother  Asthma Mother    Hearing loss Mother    Vision loss Mother    Heart disease Father    Hyperlipidemia Father    Hypertension Father    Early death Sister     Social History Social History   Tobacco Use   Smoking status: Never   Smokeless tobacco: Never  Vaping Use   Vaping Use: Never used  Substance Use Topics   Alcohol use: Not Currently    Alcohol/week: 1.0 standard drink    Types: 1 Cans of beer per week   Drug use: Never     Allergies   Glipizide and Penicillins   Review of Systems Review of Systems Per HPI  Physical Exam Triage Vital Signs ED Triage Vitals  Enc Vitals Group     BP 03/22/22 1414 115/61     Pulse Rate 03/22/22 1414 83     Resp 03/22/22 1414 20     Temp 03/22/22 1414 98.5 F (36.9 C)     Temp Source 03/22/22 1414 Oral     SpO2 03/22/22 1414 96 %     Weight --      Height --      Head Circumference --      Peak Flow --      Pain Score 03/22/22 1413 4     Pain Loc --      Pain Edu? --      Excl. in GC? --    No data found.  Updated Vital Signs BP 115/61 (BP Location: Right Arm)   Pulse 83   Temp 98.5 F (36.9 C) (Oral)   Resp 20   SpO2 96%    Visual Acuity Right Eye Distance:   Left Eye Distance:   Bilateral Distance:    Right Eye Near:   Left Eye Near:    Bilateral Near:     Physical Exam Vitals and nursing note reviewed.  Constitutional:      General: He is not in acute distress.    Appearance: He is well-developed. He is not ill-appearing, toxic-appearing or diaphoretic.  HENT:     Head: Normocephalic and atraumatic.     Right Ear: Tympanic membrane and ear canal normal. No drainage, swelling or tenderness. No middle ear effusion. Tympanic membrane is not erythematous.     Left Ear: Tympanic membrane and ear canal normal. No drainage, swelling or tenderness.  No middle ear effusion. Tympanic membrane is not erythematous.     Nose: No congestion or rhinorrhea.     Mouth/Throat:     Mouth: Mucous membranes are dry. Oral lesions (white colored lesion to uvula; no surrounding erythema or uvular edema) present.     Pharynx: Uvula midline. Posterior oropharyngeal erythema present. No uvula swelling.     Tonsils: No tonsillar exudate or tonsillar abscesses. 0 on the right. 0 on the left.  Eyes:     Conjunctiva/sclera: Conjunctivae normal.  Cardiovascular:     Rate and Rhythm: Normal rate and regular rhythm.  Pulmonary:     Effort: Pulmonary effort is normal. No respiratory distress.     Breath sounds: Normal breath sounds. No wheezing, rhonchi or rales.  Musculoskeletal:     Cervical back: Neck supple.  Lymphadenopathy:     Cervical: No cervical adenopathy.  Skin:    General: Skin is warm and dry.     Coloration: Skin is not pale.     Findings: No erythema or rash.  Neurological:     Mental Status: He is  alert and oriented to person, place, and time.     UC Treatments / Results  Labs (all labs ordered are listed, but only abnormal results are displayed) Labs Reviewed - No data to display  EKG   Radiology No results found.  Procedures Procedures (including critical care time)  Medications Ordered in  UC Medications - No data to display  Initial Impression / Assessment and Plan / UC Course  I have reviewed the triage vital signs and the nursing notes.  Pertinent labs & imaging results that were available during my care of the patient were reviewed by me and considered in my medical decision making (see chart for details).    Suspect uvula irritation secondary to recent intubation.  Will cover with cephalexin 500 mg daily given chronic kidney disease, most recent GFR 48.  Can also use lidocaine solution as needed to help with throat pain.  Seek care with ear nose and throat or primary care provider if symptoms persist or worsen despite treatment.  After review of chart, it appeared patient was discharged on Duricef, however upon further questioning, the patient and wife stated he did not start this medicine yet. Final Clinical Impressions(s) / UC Diagnoses   Final diagnoses:  Acute pharyngitis, unspecified etiology     Discharge Instructions      - The back of your throat (Uvula) looks irritated; since it has been hurting for so long, I would like for you to start the antibiotics.  Please start taking the Keflex 500 mg daily for the throat.  If your symptoms persist or worsen despite treatment, please follow up with your primary care provider or ENT provider.  Contact information is listed below.     ED Prescriptions     Medication Sig Dispense Auth. Provider   lidocaine (XYLOCAINE) 2 % solution Use as directed 15 mLs in the mouth or throat as needed for mouth pain. 100 mL Cathlean MarseillesMartinez, Bulah Lurie A, NP   cephALEXin (KEFLEX) 500 MG capsule Take 1 capsule (500 mg total) by mouth daily for 10 days. 10 capsule Valentino NoseMartinez, Pattricia Weiher A, NP      PDMP not reviewed this encounter.   Valentino NoseMartinez, Lenya Sterne A, NP 03/22/22 [phone]

## 2022-03-22 NOTE — ED Triage Notes (Addendum)
Pt reports sore throat x 1 week after he the tube use during knee surgery was removed from his throat. Listerine and salt water give no relief.

## 2022-03-24 DIAGNOSIS — M6281 Muscle weakness (generalized): Secondary | ICD-10-CM | POA: Diagnosis not present

## 2022-03-24 DIAGNOSIS — R262 Difficulty in walking, not elsewhere classified: Secondary | ICD-10-CM | POA: Diagnosis not present

## 2022-03-24 DIAGNOSIS — Z471 Aftercare following joint replacement surgery: Secondary | ICD-10-CM | POA: Diagnosis not present

## 2022-03-24 DIAGNOSIS — Z96651 Presence of right artificial knee joint: Secondary | ICD-10-CM | POA: Diagnosis not present

## 2022-03-26 DIAGNOSIS — M6281 Muscle weakness (generalized): Secondary | ICD-10-CM | POA: Diagnosis not present

## 2022-03-26 DIAGNOSIS — R262 Difficulty in walking, not elsewhere classified: Secondary | ICD-10-CM | POA: Diagnosis not present

## 2022-03-26 DIAGNOSIS — M25561 Pain in right knee: Secondary | ICD-10-CM | POA: Diagnosis not present

## 2022-03-26 DIAGNOSIS — Z96651 Presence of right artificial knee joint: Secondary | ICD-10-CM | POA: Diagnosis not present

## 2022-03-29 DIAGNOSIS — Z471 Aftercare following joint replacement surgery: Secondary | ICD-10-CM | POA: Diagnosis not present

## 2022-03-29 DIAGNOSIS — Z96651 Presence of right artificial knee joint: Secondary | ICD-10-CM | POA: Diagnosis not present

## 2022-03-29 DIAGNOSIS — R262 Difficulty in walking, not elsewhere classified: Secondary | ICD-10-CM | POA: Diagnosis not present

## 2022-03-29 DIAGNOSIS — M6281 Muscle weakness (generalized): Secondary | ICD-10-CM | POA: Diagnosis not present

## 2022-03-29 NOTE — Discharge Summary (Signed)
Physician Discharge Summary   Patient ID: Keith Schwartz MRN: [id number] DOB/AGE: [birth year] 71 y.o.  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  Admission Diagnoses:  Past Medical History:  Diagnosis Date   AICD (automatic cardioverter/defibrillator) present    Arthritis    CHF (congestive heart failure) ([place])    Coronary artery disease    Diabetes mellitus without complication ([place]) [id number]   History of kidney stones    Hypertension 10/18/2000   ICD (implantable cardioverter-defibrillator) in place    Ischemic cardiomyopathy    Pneumonia    walking   Sleep apnea    uses cpap at times   Discharge Diagnoses:   Principal Problem:   S/P revision of total knee, right  Estimated body mass index is 30.16 kg/m as calculated from the following:   Height as of this encounter: 6\' 4"  (1.93 m).   Weight as of this encounter: 112.4 kg.  Procedure:  Procedure(s) (LRB): TOTAL KNEE REVISION (Right)   Consults: None  HPI: The patient is a 71 year old male seen and evaluated for right knee pain.  He had a history of a right total knee arthroplasty performed with [employer] components approximately 10 or so years ago.  He presented to the office  with pain.  Radiographs had revealed significant osteolytic changes to the proximal tibia and to a lesser extent the distal femur.  Radiographs have indicated increased uptake concerning for loosening correlating with his pain.  In addition, he has  advanced left knee osteoarthritis.  Upon identifying the source of his pain is osteolytic changes related to polyethylene wear associated with a recalled implant, he wished to proceed with a revision surgery.  The primary indication for this was pain and  effect on his quality of life and  function.   We reviewed the risk of infection, DVT, component failure, stiffness, and need for future surgeries.  Based on the osteolytic pattern identified, particularly over the anterior aspect of the tibia, there was concerns about adequate bone stock and healing  leading to potential increased risk for component failure, need for future surgery.  After reviewing the risks, he wished to proceed for the improvement of his pain and quality of life.  Laboratory Data: Admission on 03/16/2022, Discharged on 03/17/2022  Component Date Value Ref Range Status   Glucose-Capillary 03/16/2022 123 (H)  70 - 99 mg/dL Final   Glucose reference range applies only to samples taken after fasting for at least 8 hours.   Glucose-Capillary 03/16/2022 182 (H)  70 - 99 mg/dL Final   Glucose reference range applies only to samples taken after fasting for at least 8 hours.   Comment 1 03/16/2022 Document in Chart   Final   WBC 03/17/2022 13.2 (H)  4.0 - 10.5 K/uL Final   RBC 03/17/2022 4.55  4.22 - 5.81 MIL/uL Final   Hemoglobin 03/17/2022 13.8  13.0 - 17.0 g/dL Final   HCT 03/17/2022 41.7  39.0 - 52.0 % Final   MCV 03/17/2022 91.6  80.0 - 100.0 fL Final   MCH 03/17/2022 30.3  26.0 - 34.0 pg Final   MCHC 03/17/2022 33.1  30.0 - 36.0 g/dL Final   RDW 03/17/2022 14.2  11.5 - 15.5 % Final   Platelets 03/17/2022 176  150 - 400 K/uL Final   nRBC 03/17/2022 0.0  0.0 - 0.2 %  Final   Performed at [facility], [place] [address], [place], [place] [address]   Sodium 03/17/2022 137  135 - 145 mmol/L Final   Potassium 03/17/2022 5.2 (H)  3.5 - 5.1 mmol/L Final   Chloride 03/17/2022 105  98 - 111 mmol/L Final   CO2 03/17/2022 22  22 - 32 mmol/L Final   Glucose, Bld 03/17/2022 269 (H)  70 - 99 mg/dL Final   Glucose reference range applies only to samples taken after fasting for at least 8 hours.   BUN 03/17/2022 42 (H)  8 - 23 mg/dL Final   Creatinine, Ser 03/17/2022 1.54 (H)  0.61 - 1.24 mg/dL Final    Calcium 03/17/2022 9.0  8.9 - 10.3 mg/dL Final   GFR, Estimated 03/17/2022 48 (L)  >60 mL/min Final   Comment: (NOTE) Calculated using the CKD-EPI Creatinine Equation (2021)    Anion gap 03/17/2022 10  5 - 15 Final   Performed at [facility], [place] [address], [place], [place] [address]   Glucose-Capillary 03/17/2022 219 (H)  70 - 99 mg/dL Final   Glucose reference range applies only to samples taken after fasting for at least 8 hours.   Glucose-Capillary 03/17/2022 227 (H)  70 - 99 mg/dL Final   Glucose reference range applies only to samples taken after fasting for at least 8 hours.  Hospital Outpatient Visit on 03/10/2022  Component Date Value Ref Range Status   S' Lateral 03/10/2022 4.30  cm Final   Area-P 1/2 03/10/2022 3.31  cm2 Final   Ao pk vel 03/10/2022 1.59  m/s Final   AR max vel 03/10/2022 2.09  cm2 Final   AV Peak grad 03/10/2022 10.1  mmHg Final   AV Area VTI 03/10/2022 1.83  cm2 Final   AV Area mean vel 03/10/2022 2.05  cm2 Final   AV Mean grad 03/10/2022 6.0  mmHg Final   Single Plane A2C EF 03/10/2022 56.0  % Final   Single Plane A4C EF 03/10/2022 38.2  % Final   Calc EF 03/10/2022 48.9  % Final  Hospital Outpatient Visit on 03/08/2022  Component Date Value Ref Range Status   MRSA, PCR 03/08/2022 NEGATIVE  NEGATIVE Final   Staphylococcus aureus 03/08/2022 NEGATIVE  NEGATIVE Final   Comment: (NOTE) The Xpert SA Assay (FDA approved for NASAL specimens in patients 62 years of age and older), is one component of a comprehensive surveillance program. It is not intended to diagnose infection nor to guide or monitor treatment. Performed at [facility], [place] [address], [place], [place] [address]    Hgb A1c MFr Bld 03/08/2022 7.6 (H)  4.8 - 5.6 % Final   Comment: (NOTE) Pre diabetes:          5.7%-6.4%  Diabetes:              >6.4%  Glycemic control for   <7.0% adults with diabetes    Mean Plasma Glucose 03/08/2022 171.42   mg/dL Final   Performed at [facility], [place] [address] [place], [place] [address]   ABO/RH(D) 03/08/2022 O NEG   Final   Antibody Screen 03/08/2022 NEG   Final   Sample Expiration 03/08/2022 03/19/2022,2359   Final   Extend sample reason 03/08/2022    Final                   Value:NO TRANSFUSIONS OR PREGNANCY IN THE PAST 3 MONTHS Performed at [facility], [address] [place], [place] [address]    Sodium 03/08/2022 137  135 - 145 mmol/L Final   Potassium 03/08/2022 4.3  3.5 - 5.1 mmol/L Final   Chloride 03/08/2022 103  98 - 111 mmol/L Final   CO2 03/08/2022 25  22 - 32 mmol/L Final   Glucose, Bld 03/08/2022 84  70 - 99 mg/dL Final   Glucose reference range applies only to samples taken after fasting for at least 8 hours.   BUN 03/08/2022 34 (H)  8 - 23 mg/dL Final   Creatinine, Ser 03/08/2022 1.41 (H)  0.61 - 1.24 mg/dL Final   Calcium 03/08/2022 9.9  8.9 - 10.3 mg/dL Final   GFR, Estimated 03/08/2022 53 (L)  >60 mL/min Final   Comment: (NOTE) Calculated using the CKD-EPI Creatinine Equation (2021)    Anion gap 03/08/2022 9  5 - 15 Final   Performed at [facility], [place] [address], [place], [place] [address]   WBC 03/08/2022 7.2  4.0 - 10.5 K/uL Final   RBC 03/08/2022 5.09  4.22 - 5.81 MIL/uL Final   Hemoglobin 03/08/2022 15.4  13.0 - 17.0 g/dL Final   HCT 03/08/2022 46.0  39.0 - 52.0 % Final   MCV 03/08/2022 90.4  80.0 - 100.0 fL Final   MCH 03/08/2022 30.3  26.0 - 34.0 pg Final   MCHC 03/08/2022 33.5  30.0 - 36.0 g/dL Final   RDW 03/08/2022 14.0  11.5 - 15.5 % Final   Platelets 03/08/2022 182  150 - 400 K/uL Final   nRBC 03/08/2022 0.0  0.0 - 0.2 % Final   Performed at [facility], [place] [address] [place], [place] [address]   Glucose-Capillary 03/08/2022 99  70 - 99 mg/dL Final   Glucose reference range applies only to samples taken after fasting for at least 8 hours.  Office Visit on 01/21/2022  Component  Date Value Ref Range Status   Date Time Interrogation Session 01/21/2022 [id number]   Final   Pulse Generator Manufacturer 01/21/2022 BOST   Final   Pulse Gen Model 01/21/2022 G125 MOMENTUM CRT-D   Final   Pulse Gen Serial Number 01/21/2022 [id number]   Final   Clinic Name 01/21/2022 [facility]   Final   Implantable Pulse Generator Type 01/21/2022 Cardiac Resynch Therapy Defibulator   Final   Implantable Pulse Generator Implan* 01/21/2022 [id number]   Final   Implantable Lead Manufacturer 01/21/2022 BOST   Final   Implantable Lead Model 01/21/2022 [id number] Ingevity + MRI   Final   Implantable Lead Serial Number 01/21/2022 [id number]   Final   Implantable Lead Implant Date 01/21/2022 [id number]   Final   Implantable Lead Location Detail 1 01/21/2022 UNKNOWN   Final   Implantable Lead Location 01/21/2022 [id number]   Final   Implantable Lead Manufacturer 01/21/2022 [facility]   Final   Implantable Lead Model 01/21/2022 0138 Endotak Reliance S   Final   Implantable Lead Serial Number 01/21/2022 [id number]   Final   Implantable Lead Implant Date 01/21/2022 [id number]   Final   Implantable Lead Location Detail 1 01/21/2022 UNKNOWN   Final   Implantable Lead Location 01/21/2022 [id number]   Final   Implantable Lead Manufacturer 01/21/2022 [facility]   Final   Implantable Lead Model 01/21/2022 3830 SelectSecure MRI SureScan   Final   Implantable Lead Serial Number 01/21/2022 [id number]   Final   Implantable Lead Implant Date 01/21/2022 [id number]   Final   Implantable Lead Location Detail 1 01/21/2022 UNKNOWN  Final   Implantable Lead Location 01/21/2022 [id number]   Final   Lead Channel Setting Sensing Sensi* 01/21/2022 0.6  mV Final   Lead Channel Setting Sensing Adapt* 01/21/2022 Adaptive Sensing   Final   Lead Channel Setting Sensing Sensi* 01/21/2022 1.0  mV Final   Lead Channel Setting Sensing Adapt* 01/21/2022 Adaptive Sensing   Final   Lead Channel Setting Pacing Amplit* 01/21/2022 2.0  V Final   Lead Channel Setting  Pacing Pulse * 01/21/2022 0.4  ms Final   Lead Channel Setting Pacing Amplit* 01/21/2022 2.5  V Final   Lead Channel Setting Pacing Pulse * 01/21/2022 0.7  ms Final   Lead Channel Setting Pacing Amplit* 01/21/2022 2.5  V Final   Lead Channel Setting Pacing Captur* 01/21/2022 Fixed Pacing   Final   Lead Channel Impedance Value 01/21/2022 670.0  ohm Final   Lead Channel Sensing Intrinsic Amp* 01/21/2022 5.6  mV Final   Lead Channel Pacing Threshold Ampl* 01/21/2022 1.3000  V Final   Lead Channel Pacing Threshold Puls* 01/21/2022 0.7  ms Final   Lead Channel Impedance Value 01/21/2022 717.0  ohm Final   Lead Channel Sensing Intrinsic Amp* 01/21/2022 3.4  mV Final   Lead Channel Pacing Threshold Ampl* 01/21/2022 0.7000  V Final   Lead Channel Pacing Threshold Puls* 01/21/2022 0.4  ms Final   Lead Channel Impedance Value 01/21/2022 500.0  ohm Final   Lead Channel Sensing Intrinsic Amp* 01/21/2022 22.1  mV Final   Lead Channel Pacing Threshold Ampl* 01/21/2022 1.1000  V Final   Lead Channel Pacing Threshold Puls* 01/21/2022 0.4  ms Final   HighPow Impedance 01/21/2022 66.0  ohm Final   Battery Status 01/21/2022 BOS   Final   Eval Rhythm 01/21/2022 SR   Final  Clinical Support on 01/21/2022  Component Date Value Ref Range Status   Date Time Interrogation Session 01/21/2022 [id number]   Final   Pulse Generator Manufacturer 01/21/2022 BOST   Final   Pulse Gen Model 01/21/2022 G125 MOMENTUM CRT-D   Final   Pulse Gen Serial Number 01/21/2022 [id number]   Final   Clinic Name 01/21/2022 [facility]   Final   Implantable Pulse Generator Type 01/21/2022 Cardiac Resynch Therapy Defibulator   Final   Implantable Pulse Generator Implan* 01/21/2022 [id number]   Final   Implantable Lead Manufacturer 01/21/2022 BOST   Final   Implantable Lead Model 01/21/2022 7841 Ingevity + MRI   Final   Implantable Lead Serial Number 01/21/2022 [id number]   Final   Implantable Lead Implant Date 01/21/2022 [id number]   Final    Implantable Lead Location Detail 1 01/21/2022 UNKNOWN   Final   Implantable Lead Location 01/21/2022 [id number]   Final   Implantable Lead Manufacturer 01/21/2022 GUIC   Final   Implantable Lead Model 01/21/2022 0138 Endotak Reliance S   Final   Implantable Lead Serial Number 01/21/2022 [id number]   Final   Implantable Lead Implant Date 01/21/2022 [id number]   Final   Implantable Lead Location Detail 1 01/21/2022 UNKNOWN   Final   Implantable Lead Location 01/21/2022 [id number]   Final   Implantable Lead Manufacturer 01/21/2022 MERM   Final   Implantable Lead Model 01/21/2022 3830 SelectSecure MRI SureScan   Final   Implantable Lead Serial Number 01/21/2022 [id number]   Final   Implantable Lead Implant Date 01/21/2022 [id number]   Final   Implantable Lead Location Detail 1 01/21/2022 UNKNOWN   Final   Implantable Lead Location 01/21/2022 [id number]   Final  Lead Channel Setting Sensing Sensi* 01/21/2022 0.6  mV Final   Lead Channel Setting Sensing Adapt* 01/21/2022 Adaptive Sensing   Final   Lead Channel Setting Sensing Sensi* 01/21/2022 1.0  mV Final   Lead Channel Setting Sensing Adapt* 01/21/2022 Adaptive Sensing   Final   Lead Channel Setting Pacing Amplit* 01/21/2022 3.5  V Final   Lead Channel Setting Pacing Pulse * 01/21/2022 0.4  ms Final   Lead Channel Setting Pacing Amplit* 01/21/2022 3.5  V Final   Lead Channel Setting Pacing Pulse * 01/21/2022 0.4  ms Final   Lead Channel Setting Pacing Amplit* 01/21/2022 3.5  V Final   Lead Channel Setting Pacing Captur* 01/21/2022 Fixed Pacing   Final   Lead Channel Impedance Value 01/21/2022 659  ohm Final   Lead Channel Impedance Value 01/21/2022 725  ohm Final   Lead Channel Impedance Value 01/21/2022 479  ohm Final   HighPow Impedance 01/21/2022 63  ohm Final   Battery Status 01/21/2022 BOS   Final   Battery Remaining Longevity 01/21/2022 102  mo Final   Battery Remaining Percentage 01/21/2022 100  % Final   Brady Statistic RA Percent Paced  01/21/2022 1  % Final   Brady Statistic RV Percent Paced 01/21/2022 99  % Final     X-Rays:DG Knee Right Port  Result Date: 03/17/2022 CLINICAL DATA:  Postoperative total right knee arthroplasty revision. EXAM: PORTABLE RIGHT KNEE - 1-2 VIEW COMPARISON:  None Available. FINDINGS: There is diffuse decreased bone mineralization. Status post total right knee arthroplasty with moderate long tibial greater than femoral stems. Cement surrounds the prosthesis within the proximal tibial metadiaphysis and cement surrounds the proximal femoral stem. T Here is lucency and fragmentation of the anterior and medial bone within the proximal approximate 7 cm of the proximal tibia. Additional lucency within the bone of the remaining medial aspect of the medial femoral condyle. This may be secondary to prior hardware loosening from the prior prosthesis. Expected postoperative changes including moderate soft tissue swelling and scattered soft tissue air. Moderate joint effusion. Vascular calcifications are noted. IMPRESSION: Status post right knee arthroplasty revision with expected moderate tibial greater than femoral long stems. Lucency adjacent to the distal medial femoral and proximal anteromedial tibial hardware may reflect the sequela of loosening of the prior arthroplasty hardware that has been removed. Electronically Signed   By: Yvonne Kendall M.D.   On: 03/17/2022 08:58   ECHOCARDIOGRAM COMPLETE  Result Date: 03/10/2022    ECHOCARDIOGRAM REPORT   Patient Name:   RONDALD JANOFF [facility] Date of Exam: 03/10/2022 Medical Rec #:  [id number]         Height:       76.0 in Accession #:    [id number]        Weight:       247.8 lb Date of Birth:  12/27/1950         BSA:          2.426 m Patient Age:    49 years          BP:           127/74 mmHg Patient Gender: M                 HR:           73 bpm. Exam Location:  [place] Procedure: 2D Echo, Cardiac Doppler, Color Doppler and Intracardiac            Opacification Agent  Indications:  Pre-op evaluation/ knee replacement  History:        Patient has prior history of Echocardiogram examinations. CHF,                 CAD, Pacemaker and Defibrillator; Risk Factors:Diabetes,                 Dyslipidemia and Hypertension.  Sonographer:    Merrie Roof RDCS Referring Phys: [id number] [place]  1. Left ventricular ejection fraction, by estimation, is 45 to 50%. The left ventricle has mildly decreased function. The left ventricle demonstrates regional wall motion abnormalities (see scoring diagram/findings for description). There is mild left ventricular hypertrophy of the basal-septal segment. Left ventricular diastolic parameters are consistent with Grade I diastolic dysfunction (impaired relaxation). There is mild hypokinesis of the left ventricular, mid-apical anteroseptal wall.  2. Right ventricular systolic function is normal. The right ventricular size is normal. There is normal pulmonary artery systolic pressure.  3. The mitral valve is normal in structure. No evidence of mitral valve regurgitation. No evidence of mitral stenosis.  4. The aortic valve is normal in structure. There is mild calcification of the aortic valve. There is mild thickening of the aortic valve. Aortic valve regurgitation is not visualized. No aortic stenosis is present.  5. The inferior vena cava is normal in size with greater than 50% respiratory variability, suggesting right atrial pressure of 3 mmHg. FINDINGS  Left Ventricle: Left ventricular ejection fraction, by estimation, is 45 to 50%. The left ventricle has mildly decreased function. The left ventricle demonstrates regional wall motion abnormalities. Mild hypokinesis of the left ventricular, mid-apical anteroseptal wall. Definity contrast agent was given IV to delineate the left ventricular endocardial borders. The left ventricular internal cavity size was normal in size. There is mild left ventricular hypertrophy of the  basal-septal segment. Abnormal (paradoxical) septal motion, consistent with left bundle branch block. Left ventricular diastolic parameters are consistent with Grade I diastolic dysfunction (impaired relaxation). Right Ventricle: The right ventricular size is normal. No increase in right ventricular wall thickness. Right ventricular systolic function is normal. There is normal pulmonary artery systolic pressure. The tricuspid regurgitant velocity is 2.59 m/s, and  with an assumed right atrial pressure of 3 mmHg, the estimated right ventricular systolic pressure is [id number] mmHg. Left Atrium: Left atrial size was normal in size. Right Atrium: Right atrial size was normal in size. Pericardium: There is no evidence of pericardial effusion. Mitral Valve: The mitral valve is normal in structure. No evidence of mitral valve regurgitation. No evidence of mitral valve stenosis. Tricuspid Valve: The tricuspid valve is normal in structure. Tricuspid valve regurgitation is mild . No evidence of tricuspid stenosis. Aortic Valve: The aortic valve is normal in structure. There is mild calcification of the aortic valve. There is mild thickening of the aortic valve. Aortic valve regurgitation is not visualized. No aortic stenosis is present. Aortic valve mean gradient measures 6.0 mmHg. Aortic valve peak gradient measures 10.1 mmHg. Aortic valve area, by VTI measures 1.83 cm. Pulmonic Valve: The pulmonic valve was normal in structure. Pulmonic valve regurgitation is not visualized. No evidence of pulmonic stenosis. Aorta: The aortic root is normal in size and structure. Venous: The inferior vena cava is normal in size with greater than 50% respiratory variability, suggesting right atrial pressure of 3 mmHg. IAS/Shunts: No atrial level shunt detected by color flow Doppler. Additional Comments: A device lead is visualized.  LEFT VENTRICLE PLAX 2D LVIDd:  5.40 cm      Diastology LVIDs:         4.30 cm      LV e' medial:    5.00  cm/s LV PW:         1.10 cm      LV E/e' medial:  11.5 LV IVS:        1.30 cm      LV e' lateral:   8.70 cm/s LVOT diam:     2.10 cm      LV E/e' lateral: 6.6 LV SV:         64 LV SV Index:   26 LVOT Area:     3.46 cm  LV Volumes (MOD) LV vol d, MOD A2C: 131.0 ml LV vol d, MOD A4C: 114.0 ml LV vol s, MOD A2C: 57.6 ml LV vol s, MOD A4C: 70.4 ml LV SV MOD A2C:     73.4 ml LV SV MOD A4C:     114.0 ml LV SV MOD BP:      60.8 ml RIGHT VENTRICLE RV Basal diam:  3.70 cm RV S prime:     11.20 cm/s TAPSE (M-mode): 2.8 cm LEFT ATRIUM             Index        RIGHT ATRIUM           Index LA diam:        4.40 cm 1.81 cm/m   RA Area:     18.80 cm LA Vol (A2C):   56.1 ml 23.12 ml/m  RA Volume:   53.30 ml  21.97 ml/m LA Vol (A4C):   40.4 ml 16.65 ml/m LA Biplane Vol: 49.9 ml 20.56 ml/m  AORTIC VALVE AV Area (Vmax):    2.09 cm AV Area (Vmean):   2.05 cm AV Area (VTI):     1.83 cm AV Vmax:           159.00 cm/s AV Vmean:          117.000 cm/s AV VTI:            0.350 m AV Peak Grad:      10.1 mmHg AV Mean Grad:      6.0 mmHg LVOT Vmax:         96.00 cm/s LVOT Vmean:        69.400 cm/s LVOT VTI:          0.185 m LVOT/AV VTI ratio: 0.53  AORTA Ao Root diam: 3.70 cm Ao Asc diam:  3.50 cm MITRAL VALVE               TRICUSPID VALVE MV Area (PHT): 3.31 cm    TR Peak grad:   26.8 mmHg MV Decel Time: 229 msec    TR Vmax:        259.00 cm/s MV E velocity: 57.40 cm/s MV A velocity: 84.40 cm/s  SHUNTS MV E/A ratio:  0.68        Systemic VTI:  0.18 m                            Systemic Diam: 2.10 cm Jenne Campus MD Electronically signed by Jenne Campus MD Signature Date/Time: 03/10/2022/5:19:05 PM    Final     EKG: Orders placed or performed in visit on 01/21/22   EKG 12-Lead     Hospital Course: GRACIANO HOGLAND is a 71 y.o. who was  admitted to [facility]. They were brought to the operating room on 03/16/2022 and underwent Procedure(s): TOTAL KNEE REVISION.  Patient tolerated the procedure well and was later  transferred to the recovery room and then to the orthopaedic floor for postoperative care. They were given PO and IV analgesics for pain control following their surgery. They were given 24 hours of postoperative antibiotics of  Anti-infectives (From admission, onward)    Start     Dose/Rate Route Frequency Ordered Stop   03/17/22 0000  cefadroxil (DURICEF) 500 MG capsule        500 mg Oral 2 times daily 03/17/22 0902 03/24/22 2359   03/16/22 2200  ceFAZolin (ANCEF) IVPB 2g/100 mL premix        2 g 200 mL/hr over 30 Minutes Intravenous Every 6 hours 03/16/22 1947 03/17/22 0405   03/16/22 1230  ceFAZolin (ANCEF) IVPB 2g/100 mL premix        2 g 200 mL/hr over 30 Minutes Intravenous On call to O.R. 03/16/22 1227 03/16/22 1521      and started on DVT prophylaxis in the form of Aspirin.   PT and OT were ordered for total joint protocol. Discharge planning consulted to help with postop disposition and equipment needs.  Patient had a good night on the evening of surgery. They started to get up OOB with therapy on POD #1. Pt was seen during rounds and was ready to go home pending progress with therapy.He worked with therapy on POD #1 and was meeting his goals. Pt was discharged to home later that day in stable condition.  Diet: Regular diet Activity: PWB Follow-up: in 2 weeks Disposition: Home Discharged Condition: good   Discharge Instructions     Call MD / Call 911   Complete by: As directed    If you experience chest pain or shortness of breath, CALL 911 and be transported to the hospital emergency room.  If you develope a fever above 101 F, pus (white drainage) or increased drainage or redness at the wound, or calf pain, call your surgeon's office.   Change dressing   Complete by: As directed    Maintain surgical dressing until follow up in the clinic. If the edges start to pull up, may reinforce with tape. If the dressing is no longer working, may remove and cover with gauze and tape, but  must keep the area dry and clean.  Call with any questions or concerns.   Constipation Prevention   Complete by: As directed    Drink plenty of fluids.  Prune juice may be helpful.  You may use a stool softener, such as Colace (over the counter) 100 mg twice a day.  Use MiraLax (over the counter) for constipation as needed.   Diet - low sodium heart healthy   Complete by: As directed    Increase activity slowly as tolerated   Complete by: As directed    Partial weight bearing with assist device as directed.   Post-operative opioid taper instructions:   Complete by: As directed    POST-OPERATIVE OPIOID TAPER INSTRUCTIONS: It is important to wean off of your opioid medication as soon as possible. If you do not need pain medication after your surgery it is ok to stop day one. Opioids include: Codeine, Hydrocodone(Norco, Vicodin), Oxycodone(Percocet, oxycontin) and hydromorphone amongst others.  Long term and even short term use of opiods can cause: Increased pain response Dependence Constipation Depression Respiratory depression And more.  Withdrawal symptoms can include Flu  like symptoms Nausea, vomiting And more Techniques to manage these symptoms Hydrate well Eat regular healthy meals Stay active Use relaxation techniques(deep breathing, meditating, yoga) Do Not substitute Alcohol to help with tapering If you have been on opioids for less than two weeks and do not have pain than it is ok to stop all together.  Plan to wean off of opioids This plan should start within one week post op of your joint replacement. Maintain the same interval or time between taking each dose and first decrease the dose.  Cut the total daily intake of opioids by one tablet each day Next start to increase the time between doses. The last dose that should be eliminated is the evening dose.      TED hose   Complete by: As directed    Use stockings (TED hose) for 2 weeks on both leg(s).  You may  remove them at night for sleeping.      Allergies as of 03/17/2022       Reactions   Glipizide Hives, Rash   Penicillins Rash   Add as: Penicillin G/ Childhood Tolerated Cephalosporin Date: 03/16/22.        Medication List     STOP taking these medications    aspirin EC 81 MG tablet Replaced by: aspirin 81 MG chewable tablet   doxycycline 100 MG capsule Commonly known as: VIBRAMYCIN       TAKE these medications    acetaminophen 500 MG tablet Commonly known as: TYLENOL Take 500-1,000 mg by mouth every 6 (six) hours as needed for moderate pain.   aspirin 81 MG chewable tablet Chew 1 tablet (81 mg total) by mouth 2 (two) times daily for 28 days. Replaces: aspirin EC 81 MG tablet   atorvastatin 80 MG tablet Commonly known as: LIPITOR Take 1 tablet (80 mg total) by mouth daily.   betamethasone dipropionate 0.05 % ointment Commonly known as: DIPROLENE Apply 1 application. topically daily as needed.   BLOOD GLUCOSE TEST STRIPS Strp Please dispense based on patient and insurance preference. Use as directed to monitor FSBS 3x daily. Dx: [id number].   docusate sodium 100 MG capsule Commonly known as: COLACE Take 1 capsule (100 mg total) by mouth 2 (two) times daily.   empagliflozin 25 MG Tabs tablet Commonly known as: JARDIANCE Take 12.5 mg by mouth daily.   Entresto 24-26 MG Generic drug: sacubitril-valsartan Take 1 tablet by mouth 2 (two) times daily.   glimepiride 4 MG tablet Commonly known as: AMARYL Take 4 mg by mouth daily.   hydrOXYzine 10 MG tablet Commonly known as: ATARAX Take 10 mg by mouth every 8 (eight) hours as needed for itching.   magnesium oxide 400 MG tablet Commonly known as: MAG-OX Take 400 mg by mouth daily.   melatonin 5 MG Tabs Take 15 mg by mouth at bedtime as needed (sleep).   metFORMIN 1000 MG tablet Commonly known as: GLUCOPHAGE TAKE 1 TABLET TWICE A DAY WITH MEALS   methocarbamol 500 MG tablet Commonly known as:  ROBAXIN Take 1 tablet (500 mg total) by mouth every 6 (six) hours as needed for muscle spasms.   metoprolol succinate 50 MG 24 hr tablet Commonly known as: Toprol XL Take 1 tablet (50 mg total) by mouth daily.   multivitamin tablet Take 1 tablet by mouth in the morning. MEN'S ONE A DAY 50+   nitroGLYCERIN 0.4 MG SL tablet Commonly known as: NITROSTAT Place 1 tablet (0.4 mg total) under the tongue every 5 (five)  minutes as needed for chest pain.   oxyCODONE 5 MG immediate release tablet Commonly known as: Oxy IR/ROXICODONE Take 1-2 tablets (5-10 mg total) by mouth every 4 (four) hours as needed for moderate pain or severe pain.   Ozempic (0.25 or 0.5 MG/DOSE) 2 MG/1.5ML Sopn Generic drug: Semaglutide(0.25 or 0.5MG /DOS) Inject 1 mg into the skin every Thursday.   polyethylene glycol 17 g packet Commonly known as: MIRALAX / GLYCOLAX Take 17 g by mouth daily as needed for mild constipation.   pramipexole 1 MG tablet Commonly known as: MIRAPEX Take 1 mg by mouth at bedtime.   QC Vitamin D3 50 MCG (2000 UT) Tabs Generic drug: Cholecalciferol Take 2,000 Units by mouth in the morning.   spironolactone 25 MG tablet Commonly known as: ALDACTONE Take 0.5 tablets (12.5 mg total) by mouth daily.   vitamin C 1000 MG tablet Take 1,000 mg by mouth in the morning.   vitamin E 1000 UNIT capsule Take 1,000 Units by mouth in the morning.   zinc gluconate 50 MG tablet Take 50 mg by mouth daily.       ASK your doctor about these medications    cefadroxil 500 MG capsule Commonly known as: DURICEF Take 1 capsule (500 mg total) by mouth 2 (two) times daily for 7 days. Ask about: Should I take this medication?               Discharge Care Instructions  (From admission, onward)           Start     Ordered   03/17/22 0000  Change dressing       Comments: Maintain surgical dressing until follow up in the clinic. If the edges start to pull up, may reinforce with tape. If  the dressing is no longer working, may remove and cover with gauze and tape, but must keep the area dry and clean.  Call with any questions or concerns.   03/17/22 0902            Follow-up Information     Paralee Cancel, MD. Schedule an appointment as soon as possible for a visit in 2 week(s).   Specialty: Orthopedic Surgery Contact information: [address] [place] [address] [phone]                 Signed: Griffith Citron, PA-C Orthopedic Surgery 03/29/2022, 7:57 AM

## 2022-04-01 DIAGNOSIS — Z96651 Presence of right artificial knee joint: Secondary | ICD-10-CM | POA: Diagnosis not present

## 2022-04-01 DIAGNOSIS — M6281 Muscle weakness (generalized): Secondary | ICD-10-CM | POA: Diagnosis not present

## 2022-04-01 DIAGNOSIS — R262 Difficulty in walking, not elsewhere classified: Secondary | ICD-10-CM | POA: Diagnosis not present

## 2022-04-01 DIAGNOSIS — Z471 Aftercare following joint replacement surgery: Secondary | ICD-10-CM | POA: Diagnosis not present

## 2022-04-06 DIAGNOSIS — Z96651 Presence of right artificial knee joint: Secondary | ICD-10-CM | POA: Diagnosis not present

## 2022-04-06 DIAGNOSIS — M6281 Muscle weakness (generalized): Secondary | ICD-10-CM | POA: Diagnosis not present

## 2022-04-06 DIAGNOSIS — Z471 Aftercare following joint replacement surgery: Secondary | ICD-10-CM | POA: Diagnosis not present

## 2022-04-06 DIAGNOSIS — R262 Difficulty in walking, not elsewhere classified: Secondary | ICD-10-CM | POA: Diagnosis not present

## 2022-04-08 DIAGNOSIS — R262 Difficulty in walking, not elsewhere classified: Secondary | ICD-10-CM | POA: Diagnosis not present

## 2022-04-08 DIAGNOSIS — M6281 Muscle weakness (generalized): Secondary | ICD-10-CM | POA: Diagnosis not present

## 2022-04-08 DIAGNOSIS — Z96651 Presence of right artificial knee joint: Secondary | ICD-10-CM | POA: Diagnosis not present

## 2022-04-08 DIAGNOSIS — Z471 Aftercare following joint replacement surgery: Secondary | ICD-10-CM | POA: Diagnosis not present

## 2022-04-13 DIAGNOSIS — Z471 Aftercare following joint replacement surgery: Secondary | ICD-10-CM | POA: Diagnosis not present

## 2022-04-13 DIAGNOSIS — Z96651 Presence of right artificial knee joint: Secondary | ICD-10-CM | POA: Diagnosis not present

## 2022-04-13 DIAGNOSIS — R262 Difficulty in walking, not elsewhere classified: Secondary | ICD-10-CM | POA: Diagnosis not present

## 2022-04-13 DIAGNOSIS — M6281 Muscle weakness (generalized): Secondary | ICD-10-CM | POA: Diagnosis not present

## 2022-04-15 DIAGNOSIS — M6281 Muscle weakness (generalized): Secondary | ICD-10-CM | POA: Diagnosis not present

## 2022-04-15 DIAGNOSIS — Z96651 Presence of right artificial knee joint: Secondary | ICD-10-CM | POA: Diagnosis not present

## 2022-04-15 DIAGNOSIS — M25561 Pain in right knee: Secondary | ICD-10-CM | POA: Diagnosis not present

## 2022-04-15 DIAGNOSIS — Z471 Aftercare following joint replacement surgery: Secondary | ICD-10-CM | POA: Diagnosis not present

## 2022-04-15 DIAGNOSIS — R262 Difficulty in walking, not elsewhere classified: Secondary | ICD-10-CM | POA: Diagnosis not present

## 2022-04-16 ENCOUNTER — Ambulatory Visit (INDEPENDENT_AMBULATORY_CARE_PROVIDER_SITE_OTHER): Payer: Medicare Other | Admitting: *Deleted

## 2022-04-16 DIAGNOSIS — E118 Type 2 diabetes mellitus with unspecified complications: Secondary | ICD-10-CM

## 2022-04-16 DIAGNOSIS — I5022 Chronic systolic (congestive) heart failure: Secondary | ICD-10-CM

## 2022-04-16 NOTE — Chronic Care Management (AMB) (Signed)
Care Management    RN Visit Note  04/16/2022 Name: Keith Schwartz MRN: [id number] DOB: [birth year]  Subjective: Keith Schwartz is a 71 y.o. year old male who is a primary care patient of Pickard, Cammie Mcgee, MD. The care management team was consulted for assistance with disease management and care coordination needs.    Engaged with patient by telephone for follow up visit in response to provider referral for case management and/or care coordination services.   Consent to Services:   Keith Schwartz was given information about Care Management services today including:  Care Management services includes personalized support from designated clinical staff supervised by his physician, including individualized plan of care and coordination with other care providers 24/7 contact phone numbers for assistance for urgent and routine care needs. The patient may stop case management services at any time by phone call to the office staff.  Patient agreed to services and consent obtained.   Assessment: Review of patient past medical history, allergies, medications, health status, including review of consultants reports, laboratory and other test data, was performed as part of comprehensive evaluation and provision of chronic care management services.   SDOH (Social Determinants of Health) assessments and interventions performed:    Care Plan  Allergies  Allergen Reactions   Glipizide Hives and Rash   Penicillins Rash    Add as: Penicillin G/ Childhood Tolerated Cephalosporin Date: 03/16/22.      Outpatient Encounter Medications as of 04/16/2022  Medication Sig   acetaminophen (TYLENOL) 500 MG tablet Take 500-1,000 mg by mouth every 6 (six) hours as needed for moderate pain.   Ascorbic Acid (VITAMIN C) 1000 MG tablet Take 1,000 mg by mouth in the morning.   atorvastatin (LIPITOR) 80 MG tablet Take 1 tablet (80 mg total) by mouth daily.   betamethasone dipropionate (DIPROLENE) 0.05 %  ointment Apply 1 application. topically daily as needed.   Cholecalciferol (QC VITAMIN D3) 50 MCG (2000 UT) TABS Take 2,000 Units by mouth in the morning.   docusate sodium (COLACE) 100 MG capsule Take 1 capsule (100 mg total) by mouth 2 (two) times daily.   empagliflozin (JARDIANCE) 25 MG TABS tablet Take 12.5 mg by mouth daily.   glimepiride (AMARYL) 4 MG tablet Take 4 mg by mouth daily.   Glucose Blood (BLOOD GLUCOSE TEST STRIPS) STRP Please dispense based on patient and insurance preference. Use as directed to monitor FSBS 3x daily. Dx: [id number].   hydrOXYzine (ATARAX) 10 MG tablet Take 10 mg by mouth every 8 (eight) hours as needed for itching.   lidocaine (XYLOCAINE) 2 % solution Use as directed 15 mLs in the mouth or throat as needed for mouth pain.   magnesium oxide (MAG-OX) 400 MG tablet Take 400 mg by mouth daily.   melatonin 5 MG TABS Take 15 mg by mouth at bedtime as needed (sleep).   metFORMIN (GLUCOPHAGE) 1000 MG tablet TAKE 1 TABLET TWICE A DAY WITH MEALS   methocarbamol (ROBAXIN) 500 MG tablet Take 1 tablet (500 mg total) by mouth every 6 (six) hours as needed for muscle spasms.   metoprolol succinate (TOPROL XL) 50 MG 24 hr tablet Take 1 tablet (50 mg total) by mouth daily.   Multiple Vitamin (MULTIVITAMIN) tablet Take 1 tablet by mouth in the morning. MEN'S ONE A DAY 50+   nitroGLYCERIN (NITROSTAT) 0.4 MG SL tablet Place 1 tablet (0.4 mg total) under the tongue every 5 (five) minutes as needed for chest pain.   oxyCODONE (OXY IR/ROXICODONE)  5 MG immediate release tablet Take 1-2 tablets (5-10 mg total) by mouth every 4 (four) hours as needed for moderate pain or severe pain.   polyethylene glycol (MIRALAX / GLYCOLAX) 17 g packet Take 17 g by mouth daily as needed for mild constipation.   pramipexole (MIRAPEX) 1 MG tablet Take 1 mg by mouth at bedtime.   sacubitril-valsartan (ENTRESTO) 24-26 MG Take 1 tablet by mouth 2 (two) times daily.   Semaglutide,0.25 or 0.5MG/DOS, (OZEMPIC,  0.25 OR 0.5 MG/DOSE,) 2 MG/1.5ML SOPN Inject 1 mg into the skin every Thursday.   vitamin E 1000 UNIT capsule Take 1,000 Units by mouth in the morning.   zinc gluconate 50 MG tablet Take 50 mg by mouth daily.   spironolactone (ALDACTONE) 25 MG tablet Take 0.5 tablets (12.5 mg total) by mouth daily.   No facility-administered encounter medications on file as of 04/16/2022.    Patient Active Problem List   Diagnosis Date Noted   S/P revision of total knee, right 03/16/2022   Biventricular ICD (implantable cardioverter-defibrillator) in place 05/31/4817   Chronic systolic heart failure ([place]) 02/05/2021   Coronary artery disease involving native coronary artery with angina pectoris ([place]) 12/22/2020   Gout 05/16/2017   Mixed hyperlipidemia 10/27/2015   Erectile dysfunction 10/27/2015   Testosterone deficiency 10/27/2015   Type 2 or unspecified type diabetes mellitus    Hypertension     Conditions to be addressed/monitored: CHF and DMII  Care Plan : RN Care Manager Plan of Care  Updates made by Kassie Mends, RN since 04/16/2022 12:00 AM     Problem: No plan of care established for management of chronic disease state  (CHF, DM2)   Priority: High     Long-Range Goal: Development of plan of care for chronic disease management  (CHF, DM2)   Start Date: 02/12/2022  Expected End Date: 08/11/2022  Priority: High  Note:   Current Barriers:  Knowledge Deficits related to plan of care for management of CHF and DMII  Patient reports he lives with spouse, is usually independent with all aspects of his care, (now recovering from knee replacement and is using a walker) has all medications including ozempic. Patient reports he checks CBG once daily with fasting ranges 74-102, has had one reading in the 60's range Patient reports he weighs daily with most recent weight 229 pounds Patient had right total knee replacement on 03/16/22 and feels he is recovering well, continues to go to outpatient  physical therapy 2 x per week.  RNCM Clinical Goal(s):  Patient will verbalize understanding of plan for management of CHF and DMII as evidenced by patient report, review of EHR and  through collaboration with RN Care manager, provider, and care team.   Interventions: 1:1 collaboration with primary care provider regarding development and update of comprehensive plan of care as evidenced by provider attestation and co-signature Inter-disciplinary care team collaboration (see longitudinal plan of care) Evaluation of current treatment plan related to  self management and patient's adherence to plan as established by provider   Heart Failure Interventions:  (Status:  Goal on track:  Yes. and Goal Met.) Long Term Goal Provided education on low sodium diet Discussed importance of daily weight and advised patient to weigh and record daily Reinforced heart failure action plan Follow low sodium diet Reviewed plan of care with patient, goals met, patient verbalizes understanding  Diabetes Interventions:  (Status:  Goal on track:  Yes. and Goal Met.) Long Term Goal Assessed patient's understanding of A1c  goal: <7% Reviewed medications with patient and discussed importance of medication adherence Review of patient status, including review of consultants reports, relevant laboratory and other test results, and medications completed Reinforced carbohydrate modified diet and nutritious food choices Reinforced signs/ symptoms hypoglycemia and action to take Pain assessment completed Lab Results  Component Value Date   HGBA1C 6.4 (H) 07/27/2021     Patient Goals/Self-Care Activities: Take medications as prescribed   Attend all scheduled provider appointments Call pharmacy for medication refills 3-7 days in advance of running out of medications Attend church or other social activities Perform all self care activities independently  Perform IADL's (shopping, preparing meals, housekeeping, managing  finances) independently Call provider office for new concerns or questions  call office if I gain more than 2 pounds in one day or 5 pounds in one week keep legs up while sitting use salt in moderation watch for swelling in feet, ankles and legs every day weigh myself daily follow rescue plan if symptoms flare-up eat more whole grains, fruits and vegetables, lean meats and healthy fats track symptoms and what helps feel better or worse check blood sugar at prescribed times: once daily check feet daily for cuts, sores or redness enter blood sugar readings and medication or insulin into daily log take the blood sugar log to all doctor visits take the blood sugar meter to all doctor visits limit fast food meals to no more than 1 per week read food labels for fat, fiber, carbohydrates and portion size wash and dry feet carefully every day wear comfortable, cotton socks wear comfortable, well-fitting shoes Follow low sodium diet- read labels Case closure today Follow RULE OF 15 for low blood sugar management:  How to treat low blood sugars (Blood sugar less than 70 mg/dl  Please follow the RULE OF 15 for the treatment of hypoglycemia treatment (When your blood sugars are less than 70 mg/ dl) STEP  1:  Take 15 grams of carbohydrates when your blood sugar is low, which includes:   3-4 glucose tabs or  3-4 oz of juice or regular soda or  One tube of glucose gel STEP 2:  Recheck blood sugar in 15 minutes STEP 3:  If your blood sugar is still low at the 15 minute recheck ---then, go back to STEP 1 and treat again with another 15 grams of carbohydrates       Plan: No further follow up required: case closure   Jacqlyn Larsen [facility], BSN RN Case Manager [place] [phone]

## 2022-04-16 NOTE — Patient Instructions (Signed)
Visit Information  Thank you for taking time to visit with me today. Please don't hesitate to contact me if I can be of assistance to you before our next scheduled telephone appointment.  Following are the goals we discussed today:  Take medications as prescribed   Attend all scheduled provider appointments Call pharmacy for medication refills 3-7 days in advance of running out of medications Attend church or other social activities Perform all self care activities independently  Perform IADL's (shopping, preparing meals, housekeeping, managing finances) independently Call provider office for new concerns or questions  call office if I gain more than 2 pounds in one day or 5 pounds in one week keep legs up while sitting use salt in moderation watch for swelling in feet, ankles and legs every day weigh myself daily follow rescue plan if symptoms flare-up eat more whole grains, fruits and vegetables, lean meats and healthy fats track symptoms and what helps feel better or worse check blood sugar at prescribed times: once daily check feet daily for cuts, sores or redness enter blood sugar readings and medication or insulin into daily log take the blood sugar log to all doctor visits take the blood sugar meter to all doctor visits limit fast food meals to no more than 1 per week read food labels for fat, fiber, carbohydrates and portion size wash and dry feet carefully every day wear comfortable, cotton socks wear comfortable, well-fitting shoes Follow low sodium diet- read labels Case closure today Follow RULE OF 15 for low blood sugar management:  How to treat low blood sugars (Blood sugar less than 70 mg/dl  Please follow the RULE OF 15 for the treatment of hypoglycemia treatment (When your blood sugars are less than 70 mg/ dl) STEP  1:  Take 15 grams of carbohydrates when your blood sugar is low, which includes:   3-4 glucose tabs or  3-4 oz of juice or regular soda or  One tube  of glucose gel STEP 2:  Recheck blood sugar in 15 minutes STEP 3:  If your blood sugar is still low at the 15 minute recheck ---then, go back to STEP 1 and treat again with another 15 grams of carbohydrates   Please call the care guide team at [phone] if you need to cancel or reschedule your appointment.   If you are experiencing a Mental Health or Behavioral Health Crisis or need someone to talk to, please call the Suicide and Crisis Lifeline: 988 call the [place] National Suicide Prevention Lifeline: 740-324-1621 or TTY: (628) 338-1509 TTY (709) 853-8870) to talk to a trained counselor call 1-800-273-TALK (toll free, 24 hour hotline) go to [facility] [address] [place] [phone]) call 911   Patient verbalizes understanding of instructions and care plan provided today and agrees to view in MyChart. Active MyChart status and patient understanding of how to access instructions and care plan via MyChart confirmed with patient.     No further follow up required: case closure  [facility] Martinez, BSN RN Case Manager [facility] [phone]

## 2022-04-19 DIAGNOSIS — M6281 Muscle weakness (generalized): Secondary | ICD-10-CM | POA: Diagnosis not present

## 2022-04-19 DIAGNOSIS — Z96651 Presence of right artificial knee joint: Secondary | ICD-10-CM | POA: Diagnosis not present

## 2022-04-19 DIAGNOSIS — R262 Difficulty in walking, not elsewhere classified: Secondary | ICD-10-CM | POA: Diagnosis not present

## 2022-04-19 DIAGNOSIS — Z471 Aftercare following joint replacement surgery: Secondary | ICD-10-CM | POA: Diagnosis not present

## 2022-04-22 DIAGNOSIS — M6281 Muscle weakness (generalized): Secondary | ICD-10-CM | POA: Diagnosis not present

## 2022-04-22 DIAGNOSIS — Z471 Aftercare following joint replacement surgery: Secondary | ICD-10-CM | POA: Diagnosis not present

## 2022-04-22 DIAGNOSIS — R262 Difficulty in walking, not elsewhere classified: Secondary | ICD-10-CM | POA: Diagnosis not present

## 2022-04-22 DIAGNOSIS — Z96651 Presence of right artificial knee joint: Secondary | ICD-10-CM | POA: Diagnosis not present

## 2022-04-27 DIAGNOSIS — M25561 Pain in right knee: Secondary | ICD-10-CM | POA: Diagnosis not present

## 2022-04-27 DIAGNOSIS — Z471 Aftercare following joint replacement surgery: Secondary | ICD-10-CM | POA: Diagnosis not present

## 2022-04-27 DIAGNOSIS — M6281 Muscle weakness (generalized): Secondary | ICD-10-CM | POA: Diagnosis not present

## 2022-04-27 DIAGNOSIS — R262 Difficulty in walking, not elsewhere classified: Secondary | ICD-10-CM | POA: Diagnosis not present

## 2022-04-27 DIAGNOSIS — Z96651 Presence of right artificial knee joint: Secondary | ICD-10-CM | POA: Diagnosis not present

## 2022-04-29 DIAGNOSIS — M6281 Muscle weakness (generalized): Secondary | ICD-10-CM | POA: Diagnosis not present

## 2022-04-29 DIAGNOSIS — Z96651 Presence of right artificial knee joint: Secondary | ICD-10-CM | POA: Diagnosis not present

## 2022-04-29 DIAGNOSIS — Z471 Aftercare following joint replacement surgery: Secondary | ICD-10-CM | POA: Diagnosis not present

## 2022-04-29 DIAGNOSIS — M25561 Pain in right knee: Secondary | ICD-10-CM | POA: Diagnosis not present

## 2022-04-29 DIAGNOSIS — R262 Difficulty in walking, not elsewhere classified: Secondary | ICD-10-CM | POA: Diagnosis not present

## 2022-05-03 ENCOUNTER — Ambulatory Visit (INDEPENDENT_AMBULATORY_CARE_PROVIDER_SITE_OTHER): Payer: Medicare Other

## 2022-05-03 DIAGNOSIS — I5042 Chronic combined systolic (congestive) and diastolic (congestive) heart failure: Secondary | ICD-10-CM | POA: Diagnosis not present

## 2022-05-05 DIAGNOSIS — Z471 Aftercare following joint replacement surgery: Secondary | ICD-10-CM | POA: Diagnosis not present

## 2022-05-05 DIAGNOSIS — Z96651 Presence of right artificial knee joint: Secondary | ICD-10-CM | POA: Diagnosis not present

## 2022-05-05 LAB — CUP PACEART REMOTE DEVICE CHECK
Battery Remaining Longevity: 108 mo
Battery Remaining Percentage: 100 %
Brady Statistic RA Percent Paced: 2 %
Brady Statistic RV Percent Paced: 98 %
Date Time Interrogation Session: 20230715121600
HighPow Impedance: 62 Ohm
Implantable Lead Implant Date: 20230103
Implantable Lead Implant Date: 20230103
Implantable Lead Implant Date: 20230103
Implantable Lead Location: 753858
Implantable Lead Location: 753859
Implantable Lead Location: 753860
Implantable Lead Model: 138
Implantable Lead Model: 3830
Implantable Lead Model: 7841
Implantable Lead Serial Number: 1150746
Implantable Lead Serial Number: 302782
Implantable Pulse Generator Implant Date: 20230103
Lead Channel Impedance Value: 496 Ohm
Lead Channel Impedance Value: 573 Ohm
Lead Channel Impedance Value: 692 Ohm
Lead Channel Pacing Threshold Amplitude: 1.3 V
Lead Channel Pacing Threshold Pulse Width: 0.4 ms
Lead Channel Setting Pacing Amplitude: 2 V
Lead Channel Setting Pacing Amplitude: 2.5 V
Lead Channel Setting Pacing Amplitude: 2.5 V
Lead Channel Setting Pacing Pulse Width: 0.4 ms
Lead Channel Setting Pacing Pulse Width: 0.7 ms
Lead Channel Setting Sensing Sensitivity: 0.6 mV
Lead Channel Setting Sensing Sensitivity: 1 mV
Pulse Gen Serial Number: 151244

## 2022-06-02 NOTE — Progress Notes (Signed)
Remote ICD transmission.   

## 2022-06-17 ENCOUNTER — Encounter: Payer: Self-pay | Admitting: Cardiology

## 2022-06-17 ENCOUNTER — Ambulatory Visit: Payer: Medicare Other | Attending: Cardiology | Admitting: Cardiology

## 2022-06-17 VITALS — BP 128/64 | HR 80 | Ht 75.0 in | Wt 233.0 lb

## 2022-06-17 DIAGNOSIS — I10 Essential (primary) hypertension: Secondary | ICD-10-CM | POA: Diagnosis not present

## 2022-06-17 DIAGNOSIS — E785 Hyperlipidemia, unspecified: Secondary | ICD-10-CM | POA: Insufficient documentation

## 2022-06-17 DIAGNOSIS — I2510 Atherosclerotic heart disease of native coronary artery without angina pectoris: Secondary | ICD-10-CM | POA: Diagnosis not present

## 2022-06-17 DIAGNOSIS — I5042 Chronic combined systolic (congestive) and diastolic (congestive) heart failure: Secondary | ICD-10-CM | POA: Insufficient documentation

## 2022-06-17 DIAGNOSIS — M1712 Unilateral primary osteoarthritis, left knee: Secondary | ICD-10-CM | POA: Diagnosis not present

## 2022-06-17 DIAGNOSIS — M79661 Pain in right lower leg: Secondary | ICD-10-CM | POA: Diagnosis not present

## 2022-06-17 NOTE — Progress Notes (Signed)
Cardiology Office Note:    Date:  06/17/2022   ID:  Keith Schwartz, DOB [birth year], MRN [id number]  PCP:  Susy Frizzle, MD  Cardiologist:  Donato Heinz, MD  Electrophysiologist:  None   Referring MD: Susy Frizzle, MD   No chief complaint on file.    History of Present Illness:    Keith Schwartz is a 71 y.o. male with a hx of CAD, chronic combined systolic and also heart failure, T2DM, hypertension who presents for follow-up.  He was referred by Dr. Dennard Schaumann for evaluation of syncope, initially seen on 11/12/2020.  He had an episode of syncope on 10/16/2020.  Reports he had bronchitis at the time and had taken DayQuil that morning, in addition to taking lisinopril.  He was at [facility] and had checked his blood pressure and noted it was low, 90s over 60s.  Subsequently was standing in the checkout line and suddenly passed out.  Denies any prodromal symptoms.  States that he fell to the ground, did not hit his head.  Thinks he was unconscious only for a few seconds.  EMS was called and reportedly BP was down to 70s.  He received IV fluids and BP up to 90s on arrival to ED.  In ED, EKG showed left bundle branch block.  Labs notable for creatinine 1.75.  He has not taken lisinopril since discharge from ED.  He denies any lightheadedness or syncope since this episode, or prior to that.  Denies any palpitations.  Does report he has been having chest pain.  Describes as pressure in center of upper chest that occurs with exertion.  States that he will continue to exert himself and it will subside, typically last for 1 to 2 minutes.  No smoking history.  Family history includes father had MI and died of CHF in 20s.  Echocardiogram on 12/09/2020 showed EF 30%, normal RV function.  Lexiscan Myoview on 12/09/2020 showed large severe fixed defect involving inferior and anterior walls with preservation of lateral wall and LVEF 24%, suggesting extensive infarct.  Cardiac catheterization on  12/22/2020 showed diffusely diseased LAD with positive RFR, nonobstructive LCx stenosis, mild diffuse RCA disease, normal right heart pressures.  Medical therapy recommended.  Zio patch x14 days on 12/15/2020 showed 1 episode of NSVT lasting 4 beats, 3 episodes of SVT longest lasting 10 beats.  Echocardiogram 05/19/21 shows LVEF 30 to 35%, dyssynchrony consistent with LBBB, normal RV function, no significant valvular disease.  Cardiac MRI on 07/13/2021 showed LV EF 34%, RVEF 52%, RV insertion site LGE.  BiV ICD placed by Dr. Lovena Le on 10/20/2021.  Echo 03/10/22 showed improvement in LVEF to 45-50%.   Since last clinic visit, he reports that he is doing well.  Home BP log shows BP 110s 130s over 60s to 70s.  Denies any chest pain, dyspnea, lightheadedness, syncope, or palpitations.  Does report some lower extremity edema since his knee surgery.  Reports he is recovering well.  Completed physical therapy.  He has been on Ozempic, has lost 15 pounds.   Wt Readings from Last 3 Encounters:  06/17/22 233 lb (105.7 kg)  03/16/22 247 lb 12.8 oz (112.4 kg)  03/09/22 247 lb 12.8 oz (112.4 kg)     Past Medical History:  Diagnosis Date   AICD (automatic cardioverter/defibrillator) present    Arthritis    CHF (congestive heart failure) ([place])    Coronary artery disease    Diabetes mellitus without complication ([place]) [id number]   History of  kidney stones    Hypertension 10/18/2000   ICD (implantable cardioverter-defibrillator) in place    Ischemic cardiomyopathy    Pneumonia    walking   Sleep apnea    uses cpap at times    Past Surgical History:  Procedure Laterality Date   APPENDECTOMY  10/18/1968   BIV ICD INSERTION CRT-D N/A 10/20/2021   Procedure: BIV ICD INSERTION CRT-D;  Surgeon: Marinus Maw, MD;  Location: [facility] INVASIVE CV LAB;  Service: Cardiovascular;  Laterality: N/A;   HERNIA REPAIR     INTRAVASCULAR PRESSURE WIRE/FFR STUDY N/A 12/22/2020   Procedure: INTRAVASCULAR PRESSURE WIRE/FFR STUDY;   Surgeon: Tonny Bollman, MD;  Location: [facility] INVASIVE CV LAB;  Service: Cardiovascular;  Laterality: N/A;   JOINT REPLACEMENT  10/18/2010   rt knee   RIGHT/LEFT HEART CATH AND CORONARY ANGIOGRAPHY N/A 12/22/2020   Procedure: RIGHT/LEFT HEART CATH AND CORONARY ANGIOGRAPHY;  Surgeon: Tonny Bollman, MD;  Location: [facility] INVASIVE CV LAB;  Service: Cardiovascular;  Laterality: N/A;   thumb surgery Right    TOTAL KNEE REVISION Right 03/16/2022   Procedure: TOTAL KNEE REVISION;  Surgeon: Durene Romans, MD;  Location: [facility] ORS;  Service: Orthopedics;  Laterality: Right;   VASECTOMY  10/18/1984    Current Medications: Current Meds  Medication Sig   acetaminophen (TYLENOL) 500 MG tablet Take 500-1,000 mg by mouth every 6 (six) hours as needed for moderate pain.   Ascorbic Acid (VITAMIN C) 1000 MG tablet Take 1,000 mg by mouth in the morning.   atorvastatin (LIPITOR) 80 MG tablet Take 1 tablet (80 mg total) by mouth daily.   betamethasone dipropionate (DIPROLENE) 0.05 % ointment Apply 1 application. topically daily as needed.   Cholecalciferol (QC VITAMIN D3) 50 MCG (2000 UT) TABS Take 2,000 Units by mouth in the morning.   docusate sodium (COLACE) 100 MG capsule Take 1 capsule (100 mg total) by mouth 2 (two) times daily.   empagliflozin (JARDIANCE) 25 MG TABS tablet Take 12.5 mg by mouth daily.   glimepiride (AMARYL) 4 MG tablet Take 4 mg by mouth daily.   Glucose Blood (BLOOD GLUCOSE TEST STRIPS) STRP Please dispense based on patient and insurance preference. Use as directed to monitor FSBS 3x daily. Dx: G6269.   hydrOXYzine (ATARAX) 10 MG tablet Take 10 mg by mouth every 8 (eight) hours as needed for itching.   lidocaine (XYLOCAINE) 2 % solution Use as directed 15 mLs in the mouth or throat as needed for mouth pain.   magnesium oxide (MAG-OX) 400 MG tablet Take 400 mg by mouth daily.   metFORMIN (GLUCOPHAGE) 1000 MG tablet TAKE 1 TABLET TWICE A DAY WITH MEALS   methocarbamol (ROBAXIN) 500 MG tablet  Take 1 tablet (500 mg total) by mouth every 6 (six) hours as needed for muscle spasms.   metoprolol succinate (TOPROL XL) 50 MG 24 hr tablet Take 1 tablet (50 mg total) by mouth daily.   Multiple Vitamin (MULTIVITAMIN) tablet Take 1 tablet by mouth in the morning. MEN'S ONE A DAY 50+   nitroGLYCERIN (NITROSTAT) 0.4 MG SL tablet Place 1 tablet (0.4 mg total) under the tongue every 5 (five) minutes as needed for chest pain.   oxyCODONE (OXY IR/ROXICODONE) 5 MG immediate release tablet Take 1-2 tablets (5-10 mg total) by mouth every 4 (four) hours as needed for moderate pain or severe pain.   polyethylene glycol (MIRALAX / GLYCOLAX) 17 g packet Take 17 g by mouth daily as needed for mild constipation.   pramipexole (MIRAPEX) 1 MG  tablet Take 1 mg by mouth at bedtime.   sacubitril-valsartan (ENTRESTO) 24-26 MG Take 1 tablet by mouth 2 (two) times daily.   Semaglutide,0.25 or 0.5MG /DOS, (OZEMPIC, 0.25 OR 0.5 MG/DOSE,) 2 MG/1.5ML SOPN Inject 2 mg into the skin every Thursday.   spironolactone (ALDACTONE) 25 MG tablet Take 0.5 tablets (12.5 mg total) by mouth daily.   vitamin E 1000 UNIT capsule Take 1,000 Units by mouth in the morning.   zinc gluconate 50 MG tablet Take 50 mg by mouth daily.   [DISCONTINUED] melatonin 5 MG TABS Take 15 mg by mouth at bedtime as needed (sleep).     Allergies:   Glipizide and Penicillins   Social History   Socioeconomic History   Marital status: Married    Spouse name: Not on file   Number of children: Not on file   Years of education: Not on file   Highest education level: Not on file  Occupational History   Not on file  Tobacco Use   Smoking status: Never   Smokeless tobacco: Never  Vaping Use   Vaping Use: Never used  Substance and Sexual Activity   Alcohol use: Not Currently    Alcohol/week: 1.0 standard drink of alcohol    Types: 1 Cans of beer per week   Drug use: Never   Sexual activity: Not Currently    Birth control/protection: Surgical  Other  Topics Concern   Not on file  Social History Narrative   Lives in [place] with spouse.   Retired Psychologist, sport and exercise.   Former Microbiologist, Education officer, museum, Oceanographer    Social Determinants of Radio broadcast assistant Strain: Not on file  Food Insecurity: No Food Insecurity (02/12/2022)   Hunger Vital Sign    Worried About Running Out of Food in the Last Year: Never true    Ran Out of Food in the Last Year: Never true  Transportation Needs: No Transportation Needs (02/12/2022)   PRAPARE - Hydrologist (Medical): No    Lack of Transportation (Non-Medical): No  Physical Activity: Not on file  Stress: Not on file  Social Connections: Not on file     Family History: The patient's family history includes Arthritis in his mother; Asthma in his mother; Early death in his sister; Hearing loss in his mother; Heart disease in his father; Hyperlipidemia in his father; Hypertension in his father; Vision loss in his mother.  ROS:   Please see the history of present illness. All other systems reviewed and are negative.  EKGs/Labs/Other Studies Reviewed:    The following studies were reviewed today:  EKG:   12/30/2020: normal sinus rhythm, rate 80, left bundle branch block 02/16/2021: EKG is not ordered today.   Recent Labs: 07/27/2021: ALT 41 08/25/2021: Magnesium 1.9 03/17/2022: BUN 42; Creatinine, Ser 1.54; Hemoglobin 13.8; Platelets 176; Potassium 5.2; Sodium 137  Recent Lipid Panel    Component Value Date/Time   CHOL 109 06/02/2021 1205   TRIG 194 (H) 06/02/2021 1205   HDL 30 (L) 06/02/2021 1205   CHOLHDL 3.6 06/02/2021 1205   LDLCALC 47 06/02/2021 1205    Physical Exam:    VS:  BP 128/64   Pulse 80   Ht 6\' 3"  (1.905 m)   Wt 233 lb (105.7 kg)   SpO2 97%   BMI 29.12 kg/m     Wt Readings from Last 3 Encounters:  06/17/22 233 lb (105.7 kg)  03/16/22 247 lb 12.8 oz (112.4 kg)  03/09/22  247 lb 12.8 oz (112.4 kg)     GEN: Well nourished, well  developed in no acute distress HEENT: Normal NECK: No JVD CARDIAC:RRR, 2 out of 6 systolic murmur RESPIRATORY:  Clear to auscultation without rales, wheezing or rhonchi  ABDOMEN: Soft, non-tender, non-distended MUSCULOSKELETAL:  Trace edema SKIN: Warm and dry NEUROLOGIC:  Alert and oriented x 3 PSYCHIATRIC:  Normal affect   ASSESSMENT:    1. Coronary artery disease involving native coronary artery of native heart without angina pectoris   2. Chronic combined systolic and diastolic heart failure ([place])   3. Essential hypertension   4. Hyperlipidemia, unspecified hyperlipidemia type       PLAN:     CAD: Reported chest pain concerning for angina and EKG with left bundle branch block.  Echocardiogram on 12/09/2020 showed EF 30%, normal RV function.  Lexiscan Myoview on 12/09/2020 showed large severe fixed defect involving inferior and anterior walls with preservation of lateral wall and LVEF 24%, suggesting extensive infarct.  Cardiac catheterization on 12/22/2020 showed diffusely diseased LAD with positive RFR, nonobstructive LCx stenosis, mild diffuse RCA disease, normal right heart pressures.  Medical management recommended for obstructive LAD disease.  Reports occasional exertional chest pain, has not had to use nitroglycerin -Continue aspirin 81 mg daily -Continue atorvastatin 80 mg daily.  LDL 47 on 06/02/2021 -Continue Toprol-XL 25 mg daily -As needed sublingual nitroglycerin.   Chronic combined systolic and diastolic heart failure: EF 30% on echocardiogram 12/09/2020.  Diffuse severe LAD disease as above, medical management recommended.  Echocardiogram 05/19/21 shows LVEF 30 to 35%, dyssynchrony consistent with LBBB.  Cardiac MRI on 07/13/2021 showed LV EF 34%, RVEF 52%, RV insertion site LGE.  EF remained <35% despite GDMT, and considering his LBBB with QRS 162ms, referred to EP and underwent BiV ICD placement on 10/20/2021. Echo 03/10/22 showed improvement in LVEF to 45-50%. -Continue  Entresto 24-26 mg twice daily.  Developed hypotension/hyperkalemia on 49-51 mg BID -Continue Toprol-XL 25 mg daily -Continue Jardiance 10 mg daily -Continue spironolactone 12.5 mg daily -Recent device interrogation shows 98% BiV pacing.     Syncope: Suspect due to hypotension, which may have been due to dehydration in setting of URI and continuing to take lisinopril.  SBP in 70s on EMS arrival.  No prodromal symptoms, arrhythmia remains on differential.  Echocardiogram on 12/09/2020 showed EF 30%, normal RV function.   Zio patch x14 days on 12/15/2020 showed 1 episode of NSVT lasting 4 beats, 3 episodes of SVT longest lasting 10 beats.   Hypertension: Continue Entresto and Toprol-XL as above   Hyperlipidemia: On atorvastatin 80 mg daily.  LDL 47 on 06/02/2021   T2DM: On metformin, glimepiride, Trulicity, Jardiance.  A1c 7.6% on 03/08/22   CKD stage 3a: Creatinine 1.75 on 10/16/2020, improved to creatinine 1.4 on 03/08/22.  Reports has labs coming up with PCP at [facility]   Leg pain: ABIs indicate noncompressible vessels, normal TBI's on 01/08/2021.  Lower extremity duplex shows heavy calcifications, questionable distal occlusion in the left posterior tibial artery.  Obesity: has lost 15 lbs on Ozempic     RTC in 6 months    Medication Adjustments/Labs and Tests Ordered: Current medicines are reviewed at length with the patient today.  Concerns regarding medicines are outlined above.  No orders of the defined types were placed in this encounter.   No orders of the defined types were placed in this encounter.    Patient Instructions  Medication Instructions:  Your physician recommends that you continue on your  current medications as directed. Please refer to the Current Medication list given to you today.  *If you need a refill on your cardiac medications before your next appointment, please call your pharmacy*  Follow-Up: At [facility], you and your health needs are our  priority.  As part of our continuing mission to provide you with exceptional heart care, we have created designated Provider Care Teams.  These Care Teams include your primary Cardiologist (physician) and Advanced Practice Providers (APPs -  Physician Assistants and Nurse Practitioners) who all work together to provide you with the care you need, when you need it.  We recommend signing up for the patient portal called "MyChart".  Sign up information is provided on this After Visit Summary.  MyChart is used to connect with patients for Virtual Visits (Telemedicine).  Patients are able to view lab/test results, encounter notes, upcoming appointments, etc.  Non-urgent messages can be sent to your provider as well.   To learn more about what you can do with MyChart, go to [email or web address].    Your next appointment:   6 month(s)  The format for your next appointment:   In Person  Provider:   Little Ishikawa, MD      Important Information About Sugar           Signed, Little Ishikawa, MD  06/17/2022 3:35 PM    [facility]

## 2022-06-17 NOTE — Patient Instructions (Signed)
Medication Instructions:  Your physician recommends that you continue on your current medications as directed. Please refer to the Current Medication list given to you today.  *If you need a refill on your cardiac medications before your next appointment, please call your pharmacy*  Follow-Up: At [facility], you and your health needs are our priority.  As part of our continuing mission to provide you with exceptional heart care, we have created designated Provider Care Teams.  These Care Teams include your primary Cardiologist (physician) and Advanced Practice Providers (APPs -  Physician Assistants and Nurse Practitioners) who all work together to provide you with the care you need, when you need it.  We recommend signing up for the patient portal called "MyChart".  Sign up information is provided on this After Visit Summary.  MyChart is used to connect with patients for Virtual Visits (Telemedicine).  Patients are able to view lab/test results, encounter notes, upcoming appointments, etc.  Non-urgent messages can be sent to your provider as well.   To learn more about what you can do with MyChart, go to [email or web address].    Your next appointment:   6 month(s)  The format for your next appointment:   In Person  Provider:   Christopher L Schumann, MD       Important Information About Sugar       

## 2022-07-20 ENCOUNTER — Encounter: Payer: Self-pay | Admitting: Family Medicine

## 2022-08-02 ENCOUNTER — Ambulatory Visit (INDEPENDENT_AMBULATORY_CARE_PROVIDER_SITE_OTHER): Payer: Medicare Other

## 2022-08-02 DIAGNOSIS — I5042 Chronic combined systolic (congestive) and diastolic (congestive) heart failure: Secondary | ICD-10-CM

## 2022-08-03 LAB — CUP PACEART REMOTE DEVICE CHECK
Battery Remaining Longevity: 102 mo
Battery Remaining Percentage: 100 %
Brady Statistic RA Percent Paced: 1 %
Brady Statistic RV Percent Paced: 99 %
Date Time Interrogation Session: 20231016053600
HighPow Impedance: 62 Ohm
Implantable Lead Implant Date: 20230103
Implantable Lead Implant Date: 20230103
Implantable Lead Implant Date: 20230103
Implantable Lead Location: 753858
Implantable Lead Location: 753859
Implantable Lead Location: 753860
Implantable Lead Model: 138
Implantable Lead Model: 3830
Implantable Lead Model: 7841
Implantable Lead Serial Number: 1150746
Implantable Lead Serial Number: 302782
Implantable Pulse Generator Implant Date: 20230103
Lead Channel Impedance Value: 508 Ohm
Lead Channel Impedance Value: 576 Ohm
Lead Channel Impedance Value: 687 Ohm
Lead Channel Pacing Threshold Amplitude: 1.5 V
Lead Channel Pacing Threshold Pulse Width: 0.4 ms
Lead Channel Setting Pacing Amplitude: 2 V
Lead Channel Setting Pacing Amplitude: 2.5 V
Lead Channel Setting Pacing Amplitude: 2.5 V
Lead Channel Setting Pacing Pulse Width: 0.4 ms
Lead Channel Setting Pacing Pulse Width: 0.7 ms
Lead Channel Setting Sensing Sensitivity: 0.6 mV
Lead Channel Setting Sensing Sensitivity: 1 mV
Pulse Gen Serial Number: 151244

## 2022-08-26 NOTE — Progress Notes (Signed)
Remote ICD transmission.   

## 2022-09-10 ENCOUNTER — Other Ambulatory Visit (HOSPITAL_COMMUNITY): Payer: Self-pay | Admitting: Internal Medicine

## 2022-09-10 DIAGNOSIS — R0989 Other specified symptoms and signs involving the circulatory and respiratory systems: Secondary | ICD-10-CM

## 2022-09-13 ENCOUNTER — Encounter: Payer: Self-pay | Admitting: Cardiology

## 2022-09-15 MED ORDER — CARVEDILOL 6.25 MG PO TABS: 6.2500 mg | ORAL_TABLET | Freq: Two times a day (BID) | ORAL | 3 refills | Status: DC

## 2022-09-15 NOTE — Telephone Encounter (Signed)
BP mildly elevated.  Previously developed hypotension when we increased entresto.  Let's switch from toprol XL to coreg 6.25 mg BID for better BP control.  Would check BP/HR once daily for next 2 weeks and send [place] results

## 2022-09-23 ENCOUNTER — Ambulatory Visit (HOSPITAL_COMMUNITY): Payer: TRICARE For Life (TFL)

## 2022-09-23 ENCOUNTER — Ambulatory Visit (HOSPITAL_COMMUNITY)
Admission: RE | Admit: 2022-09-23 | Discharge: 2022-09-23 | Disposition: A | Discharge status: Home / Self Care | Payer: No Typology Code available for payment source | Source: Ambulatory Visit | Attending: Internal Medicine | Admitting: Internal Medicine

## 2022-09-23 DIAGNOSIS — R0989 Other specified symptoms and signs involving the circulatory and respiratory systems: Secondary | ICD-10-CM | POA: Insufficient documentation

## 2022-11-01 ENCOUNTER — Ambulatory Visit (INDEPENDENT_AMBULATORY_CARE_PROVIDER_SITE_OTHER): Payer: Medicare Other

## 2022-11-01 DIAGNOSIS — I5042 Chronic combined systolic (congestive) and diastolic (congestive) heart failure: Secondary | ICD-10-CM

## 2022-11-01 DIAGNOSIS — I255 Ischemic cardiomyopathy: Secondary | ICD-10-CM

## 2022-11-03 LAB — CUP PACEART REMOTE DEVICE CHECK
Battery Remaining Longevity: 102 mo
Battery Remaining Percentage: 100 %
Brady Statistic RA Percent Paced: 1 %
Brady Statistic RV Percent Paced: 99 %
Date Time Interrogation Session: 20240115045800
HighPow Impedance: 70 Ohm
Implantable Lead Connection Status: 753985
Implantable Lead Connection Status: 753985
Implantable Lead Connection Status: 753985
Implantable Lead Implant Date: 20230103
Implantable Lead Implant Date: 20230103
Implantable Lead Implant Date: 20230103
Implantable Lead Location: 753858
Implantable Lead Location: 753859
Implantable Lead Location: 753860
Implantable Lead Model: 138
Implantable Lead Model: 3830
Implantable Lead Model: 7841
Implantable Lead Serial Number: 1150746
Implantable Lead Serial Number: 302782
Implantable Pulse Generator Implant Date: 20230103
Lead Channel Impedance Value: 493 Ohm
Lead Channel Impedance Value: 578 Ohm
Lead Channel Impedance Value: 671 Ohm
Lead Channel Pacing Threshold Amplitude: 1.2 V
Lead Channel Pacing Threshold Pulse Width: 0.4 ms
Lead Channel Setting Pacing Amplitude: 2 V
Lead Channel Setting Pacing Amplitude: 2.5 V
Lead Channel Setting Pacing Amplitude: 2.5 V
Lead Channel Setting Pacing Pulse Width: 0.4 ms
Lead Channel Setting Pacing Pulse Width: 0.7 ms
Lead Channel Setting Sensing Sensitivity: 0.6 mV
Lead Channel Setting Sensing Sensitivity: 1 mV
Pulse Gen Serial Number: 151244
Zone Setting Status: 755011

## 2022-11-14 ENCOUNTER — Encounter: Payer: Self-pay | Admitting: Cardiology

## 2022-11-15 MED ORDER — ENTRESTO 24-26 MG PO TABS: 1.0000 | ORAL_TABLET | Freq: Two times a day (BID) | ORAL | 3 refills | Status: AC

## 2022-11-28 ENCOUNTER — Other Ambulatory Visit: Payer: Self-pay | Admitting: Cardiology

## 2022-11-29 ENCOUNTER — Other Ambulatory Visit: Payer: Self-pay | Admitting: Cardiology

## 2022-12-09 ENCOUNTER — Telehealth: Payer: Self-pay | Admitting: *Deleted

## 2022-12-09 NOTE — Telephone Encounter (Signed)
   Pre-operative Risk Assessment    Patient Name: Keith Schwartz  DOB: 10-26-1950 MRN: [id number]      Request for Surgical Clearance    Procedure:   LEFT TOTAL KNEE ARTHROPLASTY  Date of Surgery:  Clearance 01/18/23                                 Surgeon:  DR. MATTHEW OLIN Surgeon's Group or Practice Name:  [facility] Phone number:  [phone] Fax number:  [phone]   Type of Clearance Requested:   - Medical    Type of Anesthesia:  Spinal   Additional requests/questions:    Signed, Jeanann Lewandowsky   12/09/2022, 1:26 PM

## 2022-12-09 NOTE — Telephone Encounter (Signed)
   Name: Keith Schwartz  DOB: 25-Jan-1951  MRN: [id number]  Primary Cardiologist: Donato Heinz, MD  Chart reviewed as part of pre-operative protocol coverage. The patient has an upcoming visit scheduled with Almyra Deforest, PA on 2/272024 at which time clearance can be addressed in case there are any issues that would impact surgical recommendations.  L total knee arthroplasty is not scheduled until 01/18/2023 as below. I added preop FYI to appointment note so that provider is aware to address at time of outpatient visit.  Per office protocol the cardiology provider should forward their finalized clearance decision and recommendations regarding antiplatelet therapy to the requesting party below.     I will route this message as FYI to requesting party and remove this message from the preop box as separate preop APP input not needed at this time.   Please call with any questions.  [name], NP  12/09/2022, 2:11 PM

## 2022-12-13 NOTE — Progress Notes (Signed)
Cardiology Office Note:    Date:  12/14/2022   ID:  Keith Schwartz, DOB 15-May-1951, MRN [id number]  PCP:  Susy Frizzle, MD   [facility] Cardiologist:  Donato Heinz, MD {   Referring MD: Susy Frizzle, MD   Chief Complaint  Patient presents with   Pre-op Exam   Follow-up    CAD, CHF    History of Present Illness:    Keith Schwartz is a 72 y.o. male with a hx of CAD, chronic combined systolic and diastolic heart failure, type 2 diabetes, hypertension, LBBB, s/p ICD.  Patient is followed by Dr. Gardiner Rhyme and presents today for follow up of CHF and evaluation prior to knee surgery   Per chart review, patient establish care with Dr. Gardiner Rhyme in 10/2020 for evaluation of syncope.  Echocardiogram on 12/09/2020 showed EF 30% with regional wall motion abnormalities, abnormal septal motion due to conduction system delay, normal RV systolic function, trivial mitral valve regurgitation.  Nuclear stress test on 12/09/2020 showed a large sized, severe severity fixed perfusion defect suggestive of extensive infarct, LVEF 24%.  Long-term monitor in 11/2020 showed one 4 beat run of NSVT, 3 runs of SVT longest lasting 10 beats.  Right/left heart catheterization on 12/22/2020 showed essentially normal right heart pressures with preserved cardiac output, normal pulmonary artery pressure and normal wedge pressure.  Also showed a patent left main with mild nonobstructive irregularities, diffusely diseased LAD with positive RFR, nonobstructive left circumflex stenosis, mild diffuse RCA plaque without stenosis.  Considered treating diffuse LAD stenosis with PCI, however it would require a very long length of stenting which would be at high risk for restenosis given patient's diabetes.  He was managed with medical therapy.  Later, patient underwent a cardiac MRI on 07/13/2021 that showed normal LV size with moderate systolic dysfunction (EF 123XX123), paradoxical global septal  motion consistent with LBBB, asymmetric LVH measuring up to 14 mm and basal septum, not meeting criteria for hypertrophic cardiomyopathy.  Patient had a BiV ICD CRT device placed on 10/20/2021.  Follow Up echocardiogram on 03/10/2022 showed EF improved to 45-50% with regional wall motion abnormalities, mild LVH, grade 1 diastolic dysfunction, normal RV systolic function.  Patient was last seen by cardiology on 06/17/2022.  At that time, patient reported that he had been doing well.  He is blood pressure log at home showed BP 110s-130s/60s-70s.  He had knee surgery on 03/16/2022, and he was recovering well.  He had been on Ozempic and lost 15 pounds.  Today, he reports that he is doing very well from a cardiac perspective.  He is able to work in his yard/the woods around his house without developing chest pain or shortness of breath.  Yesterday worked for about 2 hours before his knee pain made him take a break.  He denies dizziness, syncope, near syncope, palpitations, ankle edema.  He did have R knee surgery in 02/2022, and he did not have any cardiac issues perioperatively.  He denies shortness of breath, orthopnea.  Reports that at the [place], he recently had ultrasounds of his legs to check for circulation which reportedly were normal.  Past Medical History:  Diagnosis Date   AICD (automatic cardioverter/defibrillator) present    Arthritis    CHF (congestive heart failure) ([place])    Coronary artery disease    Diabetes mellitus without complication ([place]) [id number]   History of kidney stones    Hypertension 10/18/2000   ICD (implantable cardioverter-defibrillator) in place  Ischemic cardiomyopathy    Pneumonia    walking   Sleep apnea    uses cpap at times    Past Surgical History:  Procedure Laterality Date   APPENDECTOMY  10/18/1968   BIV ICD INSERTION CRT-D N/A 10/20/2021   Procedure: BIV ICD INSERTION CRT-D;  Surgeon: Evans Lance, MD;  Location: [facility];  Service:  Cardiovascular;  Laterality: N/A;   HERNIA REPAIR     INTRAVASCULAR PRESSURE WIRE/FFR STUDY N/A 12/22/2020   Procedure: INTRAVASCULAR PRESSURE WIRE/FFR STUDY;  Surgeon: Sherren Mocha, MD;  Location: [facility];  Service: Cardiovascular;  Laterality: N/A;   JOINT REPLACEMENT  10/18/2010   rt knee   RIGHT/LEFT HEART CATH AND CORONARY ANGIOGRAPHY N/A 12/22/2020   Procedure: RIGHT/LEFT HEART CATH AND CORONARY ANGIOGRAPHY;  Surgeon: Sherren Mocha, MD;  Location: [facility];  Service: Cardiovascular;  Laterality: N/A;   thumb surgery Right    TOTAL KNEE REVISION Right 03/16/2022   Procedure: TOTAL KNEE REVISION;  Surgeon: Paralee Cancel, MD;  Location: [facility];  Service: Orthopedics;  Laterality: Right;   VASECTOMY  10/18/1984    Current Medications: Current Meds  Medication Sig   acetaminophen (TYLENOL) 500 MG tablet Take 500-1,000 mg by mouth every 6 (six) hours as needed for moderate pain.   Ascorbic Acid (VITAMIN C) 1000 MG tablet Take 1,000 mg by mouth in the morning.   aspirin EC 81 MG tablet Take 81 mg by mouth daily.   atorvastatin (LIPITOR) 80 MG tablet Take 1 tablet (80 mg total) by mouth daily.   betamethasone dipropionate (DIPROLENE) 0.05 % ointment Apply 1 application. topically daily as needed.   carvedilol (COREG) 6.25 MG tablet Take 1 tablet (6.25 mg total) by mouth 2 (two) times daily.   Cholecalciferol (QC VITAMIN D3) 50 MCG (2000 UT) TABS Take 2,000 Units by mouth in the morning.   diclofenac Sodium (VOLTAREN) 1 % GEL Apply topically 4 (four) times daily.   docusate sodium (COLACE) 100 MG capsule Take 1 capsule (100 mg total) by mouth 2 (two) times daily.   empagliflozin (JARDIANCE) 25 MG TABS tablet Take 12.5 mg by mouth daily.   glimepiride (AMARYL) 4 MG tablet Take 4 mg by mouth daily.   Glucose Blood (BLOOD GLUCOSE TEST STRIPS) STRP Please dispense based on patient and insurance preference. Use as directed to monitor FSBS 3x daily. [id number].   magnesium  oxide (MAG-OX) 400 MG tablet Take 400 mg by mouth daily.   metFORMIN (GLUCOPHAGE) 1000 MG tablet TAKE 1 TABLET TWICE A DAY WITH MEALS   methocarbamol (ROBAXIN) 500 MG tablet Take 1 tablet (500 mg total) by mouth every 6 (six) hours as needed for muscle spasms.   Multiple Vitamin (MULTIVITAMIN) tablet Take 1 tablet by mouth in the morning. MEN'S ONE A DAY 50+   oxyCODONE (OXY IR/ROXICODONE) 5 MG immediate release tablet Take 1-2 tablets (5-10 mg total) by mouth every 4 (four) hours as needed for moderate pain or severe pain.   polyethylene glycol (MIRALAX / GLYCOLAX) 17 g packet Take 17 g by mouth daily as needed for mild constipation.   pramipexole (MIRAPEX) 1 MG tablet Take 1 mg by mouth at bedtime.   sacubitril-valsartan (ENTRESTO) 24-26 MG Take 1 tablet by mouth 2 (two) times daily.   Semaglutide,0.25 or 0.'5MG'$ /DOS, (OZEMPIC, 0.25 OR 0.5 MG/DOSE,) 2 MG/1.5ML SOPN Inject 2 mg into the skin every Thursday.   spironolactone (ALDACTONE) 25 MG tablet TAKE 1/2 TABLET(12.5 MG) BY MOUTH DAILY  vitamin E 1000 UNIT capsule Take 1,000 Units by mouth in the morning.   zinc gluconate 50 MG tablet Take 50 mg by mouth daily.   [DISCONTINUED] nitroGLYCERIN (NITROSTAT) 0.4 MG SL tablet Place 1 tablet (0.4 mg total) under the tongue every 5 (five) minutes as needed for chest pain.     Allergies:   Glipizide and Penicillins   Social History   Socioeconomic History   Marital status: Married    Spouse name: Not on file   Number of children: Not on file   Years of education: Not on file   Highest education level: Not on file  Occupational History   Not on file  Tobacco Use   Smoking status: Never   Smokeless tobacco: Never  Vaping Use   Vaping Use: Never used  Substance and Sexual Activity   Alcohol use: Not Currently    Alcohol/week: 1.0 standard drink of alcohol    Types: 1 Cans of beer per week   Drug use: Never   Sexual activity: Not Currently    Birth control/protection: Surgical  Other  Topics Concern   Not on file  Social History Narrative   Lives in [place] with spouse.   Retired Psychologist, sport and exercise.   Former Microbiologist, Education officer, museum, Oceanographer    Social Determinants of Radio broadcast assistant Strain: Not on file  Food Insecurity: No Food Insecurity (02/12/2022)   Hunger Vital Sign    Worried About Running Out of Food in the Last Year: Never true    Ran Out of Food in the Last Year: Never true  Transportation Needs: No Transportation Needs (02/12/2022)   PRAPARE - Hydrologist (Medical): No    Lack of Transportation (Non-Medical): No  Physical Activity: Not on file  Stress: Not on file  Social Connections: Not on file     Family History: The patient's family history includes Arthritis in his mother; Asthma in his mother; Early death in his sister; Hearing loss in his mother; Heart disease in his father; Hyperlipidemia in his father; Hypertension in his father; Vision loss in his mother.  ROS:   Please see the history of present illness.     All other systems reviewed and are negative.  EKGs/Labs/Other Studies Reviewed:    The following studies were reviewed today:  Echocardiogram 12/09/20  1. Left ventricular ejection fraction, by estimation, is 30%. The left  ventricle has severely decreased function. The left ventricle demonstrates  regional wall motion abnormalities (see scoring diagram/findings for  description). Abnormal septal motion  is also noted due to conduction system delay. There is mild left  ventricular hypertrophy. Indeterminate diastolic filling due to E-A  fusion.   2. Right ventricular systolic function is normal. The right ventricular  size is normal. Tricuspid regurgitation signal is inadequate for assessing  PA pressure.   3. The mitral valve is normal in structure. Trivial mitral valve  regurgitation. No evidence of mitral stenosis.   4. The aortic valve is grossly normal. There is mild calcification of the   aortic valve. Aortic valve regurgitation is not visualized. No aortic  stenosis is present.   5. The inferior vena cava is normal in size with greater than 50%  respiratory variability, suggesting right atrial pressure of 3 mmHg.   R/L Heart Catheterization  1.  Essentially normal right heart pressures with preserved cardiac output, normal pulmonary artery pressure, and normal wedge pressure. 2.  Patent left main with mild nonobstructive irregularity  3.  Diffusely diseased LAD from the level of the first diagonal all the way to the apex with positive RFR 4.  Nonobstructive left circumflex stenosis 5.  Mild diffuse RCA plaquing without any high-grade stenosis   Recommend initial medical therapy for cardiomyopathy and coronary artery disease with angina.  The diffuse LAD stenosis could be treated with PCI, but would involve a very long length of stenting which comes with higher risk of restenosis in this diabetic gentleman.  He is not an ideal candidate for CABG as his mammary artery would have to be tied to the apical portion of the LAD.  Favor medical therapy.  Cardiac MRI 07/13/21 IMPRESSION: 1. Normal LV size with moderate systolic dysfunction (EF 123XX123). Paradoxical septal motion consistent with left bundle branch block   2. Asymmetric LV hypertrophy measuring up to 44m in basal septum (959min posterior wall), not meeting criteria for hypertrophic cardiomyopathy (<15106m  3. RV insertion site LGE, which is a nonspecific finding often associated with elevated pulmonary pressures   4.  Normal RV size and systolic function (EF [id number] 03/10/22  1. Left ventricular ejection fraction, by estimation, is 45 to 50%. The  left ventricle has mildly decreased function. The left ventricle  demonstrates regional wall motion abnormalities (see scoring  diagram/findings for description). There is mild left  ventricular hypertrophy of the basal-septal segment. Left ventricular   diastolic parameters are consistent with Grade I diastolic dysfunction  (impaired relaxation). There is mild hypokinesis of the left ventricular,  mid-apical anteroseptal wall.   2. Right ventricular systolic function is normal. The right ventricular  size is normal. There is normal pulmonary artery systolic pressure.   3. The mitral valve is normal in structure. No evidence of mitral valve  regurgitation. No evidence of mitral stenosis.   4. The aortic valve is normal in structure. There is mild calcification  of the aortic valve. There is mild thickening of the aortic valve. Aortic  valve regurgitation is not visualized. No aortic stenosis is present.   5. The inferior vena cava is normal in size with greater than 50%  respiratory variability, suggesting right atrial pressure of 3 mmHg.    EKG:  EKG is ordered today.  The ekg ordered today demonstrates NSR, ventricular paced rhythm, HR 83 BPM   Recent Labs: 03/17/2022: BUN 42; Creatinine, Ser 1.54; Hemoglobin 13.8; Platelets 176; Potassium 5.2; Sodium 137  Recent Lipid Panel    Component Value Date/Time   CHOL 109 06/02/2021 1205   TRIG 194 (H) 06/02/2021 1205   HDL 30 (L) 06/02/2021 1205   CHOLHDL 3.6 06/02/2021 1205   LDLCALC 47 06/02/2021 1205     Risk Assessment/Calculations:                Physical Exam:    VS:  BP 108/62 (BP Location: Left Arm, Patient Position: Sitting, Cuff Size: Large)   Pulse 83   Ht '6\' 3"'$  (1.905 m)   Wt 238 lb 12.8 oz (108.3 kg)   SpO2 98%   BMI 29.85 kg/m     Wt Readings from Last 3 Encounters:  12/14/22 238 lb 12.8 oz (108.3 kg)  06/17/22 233 lb (105.7 kg)  03/16/22 247 lb 12.8 oz (112.4 kg)     GEN: Well nourished, well developed in no acute distress. Sitting comfortably on the exam table  HEENT: Normal NECK: No JVD; No carotid bruits CARDIAC: RRR, no murmurs, rubs, gallops. Radial pulses 2+ bilaterally  RESPIRATORY:  Clear to  auscultation without rales, wheezing or rhonchi.  Normal WOB on room air  ABDOMEN: Soft, non-tender, non-distended MUSCULOSKELETAL:  No edema in BLE; No deformity  SKIN: Warm and dry NEUROLOGIC:  Alert and oriented x 3 PSYCHIATRIC:  Normal affect   ASSESSMENT:    1. Preop examination   2. Coronary artery disease involving native coronary artery of native heart without angina pectoris   3. Systolic heart failure, chronic ([place])   4. Chronic combined systolic and diastolic heart failure ([place])   5. Biventricular ICD (implantable cardioverter-defibrillator) in place   6. Primary hypertension   7. Hyperlipidemia, unspecified hyperlipidemia type    PLAN:    In order of problems listed above:  Preoperative evaluation - Patient is scheduled for L knee surgery on 01/18/23. Note, he recently underwent R knee surgery on 03/16/22 and did not have any cardiac issues perioperatively  - RCRI score 2 given patient's history of CAD, CHF. This means he has a 10.1% 30-day risk of death, MI, or cardiac arrest - While patient is a moderate risk for surgery given his risk factors, he is well compensated. Functional capacity is greater than 4 mets. Patient remains physically active and is able to do a few hours of yard work without chest pain or SOB  - Patient understands that he is at an increased risk for surgery, but knee pain severely affects his quality of life.  - No further cardiac workup prior to surgery needed - Patient may hold aspirin for 5-7 days pre-operatively. Aspirin should be resumed post-operatively once adequate hemostasis is achieved    Coronary artery disease - Cardiac catheterization in right/left heart catheterization from 12/2020 showed a diffusely diseased LAD, mild nonobstructive disease in left main, left circumflex, RCA.  Considered PCI of the LAD, but would be a long length of stenting which would come in a high risk of restenosis given patient's diabetes.  Instead, he was managed medically - Patient denies chest pain or shortness of  breath on exertion  - Continue ASA 81 mg daily - Continue lipitor 80 mg daily  - Continue carvedilol 6.25 mg BID   Combined chronic systolic and diastolic heart failure Combined ischemic, nonischemic cardiomyopathy LBBB S/p ICD -Echocardiogram in 11/2020 showed EF 30% with regional wall motion abnormalities.  Right/left heart catheterization showed a diffusely diseased LAD, mild nonobstructive disease in left main, left circumflex, RCA  -Patient had BiV ICD implanted on 10/20/2021 -Most recent echocardiogram from 02/2022 showed EF 45-50%, mild LVH, grade 1 diastolic dysfunction - Patient is euvolemic on exam. Denies shortness of breath, ankle edema, orthopnea.  - Continue carvedilol 6.25 mg twice daily, Jardiance 12.5 mg daily, Entresto 24-26 mg twice daily, spironolactone 12.5 mg daily  - Ordered BMP to assess renal function and electrolytes on GDMT  Hypertension - BP well controlled on current regiment   Hyperlipidemia - Lipid panel from 05/2021 showed LDL 47, HDL 30, triglycerides 194, total cholesterol 109  - Patient reports that his lipid panel is checked at the [place]. Asked that he makes sure his lipid panel is faxed to our office after his appointment in April 2024 - Continue lipitor 80 mg daily    Medication Adjustments/Labs and Tests Ordered: Current medicines are reviewed at length with the patient today.  Concerns regarding medicines are outlined above.  Orders Placed This Encounter  Procedures   Basic metabolic panel   CBC   EKG 12-Lead   Meds ordered this encounter  Medications   nitroGLYCERIN (NITROSTAT) 0.4 MG SL tablet  Sig: Place 1 tablet (0.4 mg total) under the tongue every 5 (five) minutes as needed for chest pain.    Dispense:  25 tablet    Refill:  2    Patient Instructions  Medication Instructions:   Your physician recommends that you continue on your current medications as directed. Please refer to the Current Medication list given to you today.  *If  you need a refill on your cardiac medications before your next appointment, please call your pharmacy*  Lab Work: Your physician recommends that you return for lab work TODAY:  BMP CBC  If you have labs (blood work) drawn today and your tests are completely normal, you will receive your results only by: [facility] (if you have MyChart) OR A paper copy in the mail If you have any lab test that is abnormal or we need to change your treatment, we will call you to review the results.  Testing/Procedures: NONE ordered at this time of appointment   Follow-Up: At [facility], you and your health needs are our priority.  As part of our continuing mission to provide you with exceptional heart care, we have created designated Provider Care Teams.  These Care Teams include your primary Cardiologist (physician) and Advanced Practice Providers (APPs -  Physician Assistants and Nurse Practitioners) who all work together to provide you with the care you need, when you need it.    Your next appointment:   6 month(s)  Provider:   Donato Heinz, MD     Other Instructions     Signed, Margie Billet, PA-C  12/14/2022 12:33 PM    [facility]

## 2022-12-14 ENCOUNTER — Encounter: Payer: Self-pay | Admitting: Cardiology

## 2022-12-14 ENCOUNTER — Ambulatory Visit: Payer: Medicare Other | Attending: Physician Assistant | Admitting: Cardiology

## 2022-12-14 VITALS — BP 108/62 | HR 83 | Ht 75.0 in | Wt 238.8 lb

## 2022-12-14 DIAGNOSIS — I10 Essential (primary) hypertension: Secondary | ICD-10-CM | POA: Insufficient documentation

## 2022-12-14 DIAGNOSIS — I5022 Chronic systolic (congestive) heart failure: Secondary | ICD-10-CM | POA: Diagnosis not present

## 2022-12-14 DIAGNOSIS — I2510 Atherosclerotic heart disease of native coronary artery without angina pectoris: Secondary | ICD-10-CM | POA: Insufficient documentation

## 2022-12-14 DIAGNOSIS — Z95810 Presence of automatic (implantable) cardiac defibrillator: Secondary | ICD-10-CM | POA: Insufficient documentation

## 2022-12-14 DIAGNOSIS — Z01818 Encounter for other preprocedural examination: Secondary | ICD-10-CM | POA: Diagnosis not present

## 2022-12-14 DIAGNOSIS — E785 Hyperlipidemia, unspecified: Secondary | ICD-10-CM | POA: Diagnosis not present

## 2022-12-14 DIAGNOSIS — I5042 Chronic combined systolic (congestive) and diastolic (congestive) heart failure: Secondary | ICD-10-CM | POA: Diagnosis not present

## 2022-12-14 MED ORDER — NITROGLYCERIN 0.4 MG SL SUBL: 0.4000 mg | SUBLINGUAL_TABLET | SUBLINGUAL | 2 refills | Status: DC | PRN

## 2022-12-14 NOTE — Patient Instructions (Signed)
Medication Instructions:   Your physician recommends that you continue on your current medications as directed. Please refer to the Current Medication list given to you today.  *If you need a refill on your cardiac medications before your next appointment, please call your pharmacy*  Lab Work: Your physician recommends that you return for lab work TODAY:  BMP CBC  If you have labs (blood work) drawn today and your tests are completely normal, you will receive your results only by: [facility] (if you have MyChart) OR A paper copy in the mail If you have any lab test that is abnormal or we need to change your treatment, we will call you to review the results.  Testing/Procedures: NONE ordered at this time of appointment   Follow-Up: At [facility], you and your health needs are our priority.  As part of our continuing mission to provide you with exceptional heart care, we have created designated Provider Care Teams.  These Care Teams include your primary Cardiologist (physician) and Advanced Practice Providers (APPs -  Physician Assistants and Nurse Practitioners) who all work together to provide you with the care you need, when you need it.    Your next appointment:   6 month(s)  Provider:   Donato Heinz, MD     Other Instructions

## 2022-12-15 LAB — BASIC METABOLIC PANEL
BUN/Creatinine Ratio: 21 (ref 10–24)
BUN: 26 mg/dL (ref 8–27)
CO2: 20 mmol/L (ref 20–29)
Calcium: 10 mg/dL (ref 8.6–10.2)
Chloride: 99 mmol/L (ref 96–106)
Creatinine, Ser: 1.23 mg/dL (ref 0.76–1.27)
Glucose: 232 mg/dL — ABNORMAL HIGH (ref 70–99)
Potassium: 4.9 mmol/L (ref 3.5–5.2)
Sodium: 137 mmol/L (ref 134–144)
eGFR: 63 mL/min/{1.73_m2} (ref >59)

## 2022-12-15 LAB — CBC
Hematocrit: 47.4 % (ref 37.5–51.0)
Hemoglobin: 15.6 g/dL (ref 13.0–17.7)
MCH: 30.5 pg (ref 26.6–33.0)
MCHC: 32.9 g/dL (ref 31.5–35.7)
MCV: 93 fL (ref 79–97)
Platelets: 196 10*3/uL (ref 150–450)
RBC: 5.12 x10E6/uL (ref 4.14–5.80)
RDW: 12.6 % (ref 11.6–15.4)
WBC: 7.2 10*3/uL (ref 3.4–10.8)

## 2022-12-20 NOTE — Progress Notes (Signed)
Remote ICD transmission.   

## 2022-12-31 ENCOUNTER — Telehealth: Payer: Self-pay | Admitting: *Deleted

## 2022-12-31 NOTE — Telephone Encounter (Signed)
This the result from [facility]  released to [facility] - patient view result on mychart 12/31/22 Good Morning Keith Schwartz!   Your labs from yesterday showed that your kidney function is normal. Electrolytes are all within normal limits. CBC showed that you are not anemic. Your blood glucose is elevated. This could be because you had eaten prior to having labs drawn, but make sure you are taking your medications for diabetes and following up with your primary care as needed. Continue your current medications, and I hope your surgery goes well!   Thanks, [employer]

## 2022-12-31 NOTE — Telephone Encounter (Signed)
Spoke to wife Mrs Mound. Per request , patient and wife from previous report when they were in the office.  Mrs Yoke and patient wanted to know what patient's HgbA1c level was at last lab draw on  12/14/22  RN informed her that this lab was not obtained at that time only CBC , BMP. Wife tanked Rn . Verbalized understanding.

## 2022-12-31 NOTE — Telephone Encounter (Signed)
Patient came by office to ask question - aware RN will call patient later today  for assistance.

## 2023-01-06 ENCOUNTER — Encounter: Payer: Self-pay | Admitting: Internal Medicine

## 2023-01-06 NOTE — Patient Instructions (Addendum)
DUE TO COVID-19 ONLY TWO VISITORS  (aged 72 and older)  ARE ALLOWED TO COME WITH YOU AND STAY IN THE WAITING ROOM ONLY DURING PRE OP AND PROCEDURE.   **NO VISITORS ARE ALLOWED IN THE SHORT STAY AREA OR RECOVERY ROOM!!**  IF YOU WILL BE ADMITTED INTO THE HOSPITAL YOU ARE ALLOWED ONLY FOUR SUPPORT PEOPLE DURING VISITATION HOURS ONLY (7 AM -8PM)   The support person(s) must pass our screening, gel in and out, and wear a mask at all times, including in the patient's room. Patients must also wear a mask when staff or their support person are in the room. Visitors GUEST BADGE MUST BE WORN VISIBLY  One adult visitor may remain with you overnight and MUST be in the room by 8 P.M.     Your procedure is scheduled on: 01/18/23   Report to [facility] Main Entrance    Report to admitting at  5:15 AM   Call this number if you have problems the morning of surgery [phone]   Do not eat food :After Midnight.   After Midnight you may have the following liquids until _4:15_AM/ DAY OF SURGERY  Water Black Coffee (sugar ok, NO MILK/CREAM OR CREAMERS)  Tea (sugar ok, NO MILK/CREAM OR CREAMERS) regular and decaf                             Plain Jell-O (NO RED)                                           Fruit ices (not with fruit pulp, NO RED)                                     Popsicles (NO RED)                                                                  Juice: apple, WHITE grape, WHITE cranberry Sports drinks like Gatorade (NO RED)                   The day of surgery:  Drink ONE (1) G2 at 4:00  AM the morning of surgery. Drink in one sitting. Do not sip.  This drink was given to you during your hospital  pre-op appointment visit. Nothing else to drink after completing the   G2. At 4:15 AM          If you have questions, please contact your surgeon's office.      Oral Hygiene is also important to reduce your risk of infection.                                    Remember -  BRUSH YOUR TEETH THE MORNING OF SURGERY WITH YOUR REGULAR TOOTHPASTE  DENTURES WILL BE REMOVED PRIOR TO SURGERY PLEASE DO NOT APPLY "Poly grip" OR ADHESIVES!!!    Take these medicines the morning of surgery with A SIP OF  WATER: Atorvastatin                                                                                                                           Pramipexole                                                                                                                           Carvedilol                                                                                                                           Entresto                                                                                                                              Before surgery.Stop taking _ASA 81 MG 7 days prior to day of surgery. Last dose on 2/25/24_____as instructed by _____________.  Stop taking ____________as directed by your Surgeon/Cardiologist.  How to Manage Your Diabetes Before and After Surgery  Why is it important to control my blood sugar before and after surgery? Improving blood sugar levels before and after surgery helps healing and can limit problems. A way of improving blood sugar control is eating a healthy diet by:  Eating less sugar and carbohydrates  Increasing activity/exercise  Talking with your doctor about reaching your blood sugar goals High blood sugars (greater than  180 mg/dL) can raise your risk of infections and slow your recovery, so you will need to focus on controlling your diabetes during the weeks before surgery. Make sure that the doctor who takes care of your diabetes knows about your planned surgery including the date and location.  How do I manage my blood sugar before surgery? Check your blood sugar at least 4 times a day, starting 2 days before surgery, to make sure that the level is not too high or low. Check your blood sugar the morning of your surgery  when you wake up and every 2 hours until you get to the Short Stay unit. If your blood sugar is less than 70 mg/dL, you will need to treat for low blood sugar: Do not take insulin. Treat a low blood sugar (less than 70 mg/dL) with  cup of clear juice (cranberry or apple), 4 glucose tablets, OR glucose gel. Recheck blood sugar in 15 minutes after treatment (to make sure it is greater than 70 mg/dL). If your blood sugar is not greater than 70 mg/dL on recheck, call [phone] for further instructions. Report your blood sugar to the short stay nurse when you get to Short Stay.  If you are admitted to the hospital after surgery: Your blood sugar will be checked by the staff and you will probably be given insulin after surgery (instead of oral diabetes medicines) to make sure you have good blood sugar levels. The goal for blood sugar control after surgery is 80-180 mg/dL.   WHAT DO I DO ABOUT MY DIABETES MEDICATION?  Do not take oral diabetes medicines (pills) the morning of surgery.(Glimepiride and metformin)  Hold Jardiance for 3 days. Last dose 3/29 . Hold Ozempic for 7 days. Last dose was 01/06/23    Bring CPAP mask and tubing day of surgery.                              You may not have any metal on your body including  jewelry, and body piercing             Do not wear  lotions, powders, cologne, or deodorant                Men may shave face and neck.   Do not bring valuables to the hospital. [place].   Contacts, glasses, or bridgework may not be worn into surgery.   Bring small overnight bag day of surgery.   DO NOT [place]. PHARMACY WILL DISPENSE MEDICATIONS LISTED ON YOUR MEDICATION LIST TO YOU DURING YOUR ADMISSION [place]!       Special Instructions: Bring a copy of your healthcare power of attorney and living will documents  the day of surgery if you haven't scanned  them before.              Please read over the following fact sheets you were given: IF YOU HAVE QUESTIONS ABOUT YOUR PRE-OP INSTRUCTIONS PLEASE CALL [phone]    [facility] - Preparing for Surgery Before surgery, you can play an important role.  Because skin is not sterile, your skin needs to be as free of germs as possible.  You can reduce the number of germs on your skin by washing with CHG (chlorahexidine gluconate) soap before surgery.  CHG is an antiseptic cleaner which kills germs and bonds with the skin to continue killing germs even after washing. Please DO NOT use if you have an allergy to CHG or antibacterial soaps.  If your skin becomes reddened/irritated stop using the CHG and inform your nurse when you arrive at Short Stay..  You may shave your face/neck. Please follow these instructions carefully:  1.  Shower with CHG Soap the night before surgery and the  morning of Surgery.  2.  If you choose to wash your hair, wash your hair first as usual with your  normal  shampoo.  3.  After you shampoo, rinse your hair and body thoroughly to remove the  shampoo.                            4.  Use CHG as you would any other liquid soap.  You can apply chg directly  to the skin and wash                       Gently with a scrungie or clean washcloth.  5.  Apply the CHG Soap to your body ONLY FROM THE NECK DOWN.   Do not use on face/ open                           Wound or open sores. Avoid contact with eyes, ears mouth and genitals (private parts).                       Wash face,  Genitals (private parts) with your normal soap.             6.  Wash thoroughly, paying special attention to the area where your surgery  will be performed.  7.  Thoroughly rinse your body with warm water from the neck down.  8.  DO NOT shower/wash with your normal soap after using and rinsing off  the CHG Soap.             9.  Pat yourself dry with a clean towel.            10.  Wear clean pajamas.             11.  Place clean sheets on your bed the night of your first shower and do not  sleep with pets. Day of Surgery : Do not apply any lotions/deodorants the morning of surgery.  Please wear clean clothes to the hospital/surgery center.  FAILURE TO FOLLOW THESE INSTRUCTIONS MAY RESULT IN THE CANCELLATION OF YOUR SURGERY  PATIENT SIGNATURE_________________________________   ________________________________________________________________________  Keith Schwartz  An incentive spirometer is a tool that can help keep your lungs clear and active. This tool measures how well you are filling your lungs with each breath. Taking long deep breaths may help reverse or decrease the chance of developing breathing (pulmonary) problems (especially infection) following: A long period of time when you are unable to move or be active. BEFORE THE PROCEDURE  If the spirometer includes an indicator to show your best effort, your nurse or respiratory therapist will set it to a desired goal. If possible, sit up straight or lean slightly forward. Try not to slouch. Hold the incentive spirometer in an upright position. INSTRUCTIONS FOR USE  Sit on the edge of your bed if possible, or sit up as far  as you can in bed or on a chair. Hold the incentive spirometer in an upright position. Breathe out normally. Place the mouthpiece in your mouth and seal your lips tightly around it. Breathe in slowly and as deeply as possible, raising the piston or the ball toward the top of the column. Hold your breath for 3-5 seconds or for as long as possible. Allow the piston or ball to fall to the bottom of the column. Remove the mouthpiece from your mouth and breathe out normally. Rest for a few seconds and repeat Steps 1 through 7 at least 10 times every 1-2 hours when you are awake. Take your time and take a few normal breaths between deep breaths. The spirometer may include an indicator to show your best effort. Use the  indicator as a goal to work toward during each repetition. After each set of 10 deep breaths, practice coughing to be sure your lungs are clear. If you have an incision (the cut made at the time of surgery), support your incision when coughing by placing a pillow or rolled up towels firmly against it. Once you are able to get out of bed, walk around indoors and cough well. You may stop using the incentive spirometer when instructed by your caregiver.  RISKS AND COMPLICATIONS Take your time so you do not get dizzy or light-headed. If you are in pain, you may need to take or ask for pain medication before doing incentive spirometry. It is harder to take a deep breath if you are having pain. AFTER USE Rest and breathe slowly and easily. It can be helpful to keep track of a log of your progress. Your caregiver can provide you with a simple table to help with this. If you are using the spirometer at home, follow these instructions: [place] IF:  You are having difficultly using the spirometer. You have trouble using the spirometer as often as instructed. Your pain medication is not giving enough relief while using the spirometer. You develop fever of 100.5 F (38.1 C) or higher. SEEK IMMEDIATE MEDICAL CARE IF:  You cough up bloody sputum that had not been present before. You develop fever of 102 F (38.9 C) or greater. You develop worsening pain at or near the incision site. MAKE SURE YOU:  Understand these instructions. Will watch your condition. Will get help right away if you are not doing well or get worse. Document Released: 02/14/2007 Document Revised: 12/27/2011 Document Reviewed: 04/17/2007 [facility] Patient Information 2014 [place], [place].   ________________________________________________________________________

## 2023-01-06 NOTE — Progress Notes (Signed)
[facility] DEVICE PROGRAMMING  Patient Information: Name:  PRIYAM WOODARDS  DOB:  09/25/1951  MRN:  [id number]  Planned Procedure: left total knee arthroplasty  Surgeon:  Dr. Paralee Cancel  Date of Procedure:  01/18/23  Cautery will be used.  Position during surgery:     Device Information:  Clinic EP Physician:  Cristopher Peru, MD   Device Type:  Defibrillator Manufacturer and Phone #:  [employer]: [phone] Pacemaker Dependent?:  No. Date of Last Device Check:  11/01/2022 Normal Device Function?:  Yes.    Electrophysiologist's Recommendations:  Have magnet available. Provide continuous ECG monitoring when magnet is used or reprogramming is to be performed.  Procedure should not interfere with device function.  No device programming or magnet placement needed.  Per Device Clinic Standing Orders, Damian Leavell, RN  4:36 PM 01/06/2023

## 2023-01-10 ENCOUNTER — Encounter (HOSPITAL_COMMUNITY)
Admission: RE | Admit: 2023-01-10 | Discharge: 2023-01-10 | Disposition: A | Discharge status: Home / Self Care | Payer: No Typology Code available for payment source | Source: Ambulatory Visit | Attending: Orthopedic Surgery | Admitting: Orthopedic Surgery

## 2023-01-10 ENCOUNTER — Encounter (HOSPITAL_COMMUNITY): Payer: Self-pay

## 2023-01-10 ENCOUNTER — Other Ambulatory Visit: Payer: Self-pay

## 2023-01-10 DIAGNOSIS — G4730 Sleep apnea, unspecified: Secondary | ICD-10-CM | POA: Diagnosis not present

## 2023-01-10 DIAGNOSIS — Z7982 Long term (current) use of aspirin: Secondary | ICD-10-CM | POA: Insufficient documentation

## 2023-01-10 DIAGNOSIS — E119 Type 2 diabetes mellitus without complications: Secondary | ICD-10-CM | POA: Insufficient documentation

## 2023-01-10 DIAGNOSIS — I110 Hypertensive heart disease with heart failure: Secondary | ICD-10-CM | POA: Diagnosis not present

## 2023-01-10 DIAGNOSIS — Z7984 Long term (current) use of oral hypoglycemic drugs: Secondary | ICD-10-CM | POA: Insufficient documentation

## 2023-01-10 DIAGNOSIS — I252 Old myocardial infarction: Secondary | ICD-10-CM | POA: Insufficient documentation

## 2023-01-10 DIAGNOSIS — Z87442 Personal history of urinary calculi: Secondary | ICD-10-CM | POA: Diagnosis not present

## 2023-01-10 DIAGNOSIS — Z01818 Encounter for other preprocedural examination: Secondary | ICD-10-CM

## 2023-01-10 DIAGNOSIS — I2510 Atherosclerotic heart disease of native coronary artery without angina pectoris: Secondary | ICD-10-CM | POA: Diagnosis not present

## 2023-01-10 DIAGNOSIS — Z7985 Long-term (current) use of injectable non-insulin antidiabetic drugs: Secondary | ICD-10-CM | POA: Diagnosis not present

## 2023-01-10 DIAGNOSIS — Z01812 Encounter for preprocedural laboratory examination: Secondary | ICD-10-CM | POA: Insufficient documentation

## 2023-01-10 DIAGNOSIS — I509 Heart failure, unspecified: Secondary | ICD-10-CM | POA: Insufficient documentation

## 2023-01-10 DIAGNOSIS — Z79899 Other long term (current) drug therapy: Secondary | ICD-10-CM | POA: Insufficient documentation

## 2023-01-10 DIAGNOSIS — R7303 Prediabetes: Secondary | ICD-10-CM

## 2023-01-10 DIAGNOSIS — Z95810 Presence of automatic (implantable) cardiac defibrillator: Secondary | ICD-10-CM | POA: Insufficient documentation

## 2023-01-10 DIAGNOSIS — M1712 Unilateral primary osteoarthritis, left knee: Secondary | ICD-10-CM | POA: Diagnosis not present

## 2023-01-10 HISTORY — DX: Acute myocardial infarction, unspecified: I21.9

## 2023-01-10 LAB — BASIC METABOLIC PANEL
Anion gap: 10 (ref 5–15)
BUN: 23 mg/dL (ref 8–23)
CO2: 24 mmol/L (ref 22–32)
Calcium: 9.6 mg/dL (ref 8.9–10.3)
Chloride: 104 mmol/L (ref 98–111)
Creatinine, Ser: 1.23 mg/dL (ref 0.61–1.24)
GFR, Estimated: >60 mL/min (ref >60)
Glucose, Bld: 179 mg/dL — ABNORMAL HIGH (ref 70–99)
Potassium: 4.6 mmol/L (ref 3.5–5.1)
Sodium: 138 mmol/L (ref 135–145)

## 2023-01-10 LAB — CBC
HCT: 47.4 % (ref 39.0–52.0)
Hemoglobin: 15.6 g/dL (ref 13.0–17.0)
MCH: 30.6 pg (ref 26.0–34.0)
MCHC: 32.9 g/dL (ref 30.0–36.0)
MCV: 92.9 fL (ref 80.0–100.0)
Platelets: 172 10*3/uL (ref 150–400)
RBC: 5.1 MIL/uL (ref 4.22–5.81)
RDW: 13.4 % (ref 11.5–15.5)
WBC: 6.8 10*3/uL (ref 4.0–10.5)
nRBC: 0 % (ref 0.0–0.2)

## 2023-01-10 LAB — SURGICAL PCR SCREEN
MRSA, PCR: NEGATIVE
Staphylococcus aureus: NEGATIVE

## 2023-01-10 LAB — GLUCOSE, CAPILLARY: Glucose-Capillary: 194 mg/dL — ABNORMAL HIGH (ref 70–99)

## 2023-01-10 NOTE — Progress Notes (Addendum)
Anesthesia note:   PCP - Dr. Barbaraann Rondo Cardiologist -Dr. Levada Dy Other-   Chest x-ray - 10/20/21-epic EKG - 12/14/22-epic Stress Test - no ECHO - 05/19/21-epic Cardiac Cath - 2022 CABG-no Pacemaker/ICD device last checked:11/01/22  Sleep Study - yes CPAP - no he uses a dental device   CBG at PAT visit-194 Fasting Blood Sugar at home-115-140 Checks Blood Sugar _2-3 times a week_ Jardiance and Ozempic are held___  Blood Thinner:ASA 81 mg Blood Thinner Instructions: Aspirin Instructions:stop 5 days before surgery Last Dose:01/10/23  Anesthesia review: Yes / reason:  Patient denies shortness of breath, fever, cough and chest pain at PAT appointment. Pt reports no SOB with activities   Patient verbalized understanding of instructions that were given to them at the PAT appointment. Patient was also instructed that they will need to review over the PAT instructions again at home before surgery.yes his wife was with him

## 2023-01-11 ENCOUNTER — Encounter (HOSPITAL_COMMUNITY): Payer: Self-pay | Admitting: Physician Assistant

## 2023-01-11 LAB — HEMOGLOBIN A1C
Hgb A1c MFr Bld: 8.3 % — ABNORMAL HIGH (ref 4.8–5.6)
Mean Plasma Glucose: 192 mg/dL

## 2023-01-11 NOTE — Progress Notes (Signed)
Anesthesia Chart Review   Case: [id number] Date/Time: 01/18/23 0700   Procedure: TOTAL KNEE ARTHROPLASTY (Left: Knee)   Anesthesia type: Spinal   Pre-op diagnosis: Left knee osteoarthritis   Location: [facility] ROOM 09 / [facility]   Surgeons: Paralee Cancel, MD       DISCUSSION:71 y.o. never smoker with h/o HTN, sleep apnea, DM II, CAD, CHF, LBBB, AICD in place (device orders in 01/06/2023 progress note), left knee OA scheduled for above procedure 01/18/23 with Dr. Paralee Cancel.   A1C 8.3, forwarded to surgeon office.   Pt last seen by cardiology 12/14/2022. Per OV note, "- Patient is scheduled for L knee surgery on 01/18/23. Note, he recently underwent R knee surgery on 03/16/22 and did not have any cardiac issues perioperatively  - RCRI score 2 given patient's history of CAD, CHF. This means he has a 10.1% 30-day risk of death, MI, or cardiac arrest - While patient is a moderate risk for surgery given his risk factors, he is well compensated. Functional capacity is greater than 4 mets. Patient remains physically active and is able to do a few hours of yard work without chest pain or SOB  - Patient understands that he is at an increased risk for surgery, but knee pain severely affects his quality of life.  - No further cardiac workup prior to surgery needed - Patient may hold aspirin for 5-7 days pre-operatively. Aspirin should be resumed post-operatively once adequate hemostasis is achieved"   VS: BP 127/75   Pulse 77   Temp 36.5 C (Oral)   Resp 18   Ht 6\' 3"  (1.905 m)   Wt 108 kg   SpO2 97%   BMI 29.75 kg/m   PROVIDERS: Lurline Hare, MD is PCP   Cardiologist:  Donato Heinz, MD {  LABS:  forwarded to surgeon (all labs ordered are listed, but only abnormal results are displayed)  Labs Reviewed  HEMOGLOBIN A1C - Abnormal; Notable for the following components:      Result Value   Hgb A1c MFr Bld 8.3 (*)    All other components within normal limits  BASIC METABOLIC PANEL -  Abnormal; Notable for the following components:   Glucose, Bld 179 (*)    All other components within normal limits  GLUCOSE, CAPILLARY - Abnormal; Notable for the following components:   Glucose-Capillary 194 (*)    All other components within normal limits  SURGICAL PCR SCREEN  CBC     IMAGES:   EKG:   CV: Echo 03/10/2022 1. Left ventricular ejection fraction, by estimation, is 45 to 50%. The  left ventricle has mildly decreased function. The left ventricle  demonstrates regional wall motion abnormalities (see scoring  diagram/findings for description). There is mild left  ventricular hypertrophy of the basal-septal segment. Left ventricular  diastolic parameters are consistent with Grade I diastolic dysfunction  (impaired relaxation). There is mild hypokinesis of the left ventricular,  mid-apical anteroseptal wall.   2. Right ventricular systolic function is normal. The right ventricular  size is normal. There is normal pulmonary artery systolic pressure.   3. The mitral valve is normal in structure. No evidence of mitral valve  regurgitation. No evidence of mitral stenosis.   4. The aortic valve is normal in structure. There is mild calcification  of the aortic valve. There is mild thickening of the aortic valve. Aortic  valve regurgitation is not visualized. No aortic stenosis is present.   5. The inferior vena cava is normal in  size with greater than 50%  respiratory variability, suggesting right atrial pressure of 3 mmHg.   Past Medical History:  Diagnosis Date   AICD (automatic cardioverter/defibrillator) present    Arthritis    CHF (congestive heart failure) (HCC)    Coronary artery disease    Diabetes mellitus without complication ([place]) [id number]   History of kidney stones    Hypertension 10/18/2000   ICD (implantable cardioverter-defibrillator) in place    Ischemic cardiomyopathy    Myocardial infarction [facility])    Sleep apnea    uses cpap at times     Past Surgical History:  Procedure Laterality Date   APPENDECTOMY  10/18/1968   BIV ICD INSERTION CRT-D N/A 10/20/2021   Procedure: BIV ICD INSERTION CRT-D;  Surgeon: Evans Lance, MD;  Location: [place] [facility] LAB;  Service: Cardiovascular;  Laterality: N/A;   CYSTOSCOPY W/ STONE MANIPULATION Left 1993   HERNIA REPAIR Bilateral 1976   groin and 1979   INTRAVASCULAR PRESSURE WIRE/FFR STUDY N/A 12/22/2020   Procedure: INTRAVASCULAR PRESSURE WIRE/FFR STUDY;  Surgeon: Sherren Mocha, MD;  Location: [place] [facility] LAB;  Service: Cardiovascular;  Laterality: N/A;   JOINT REPLACEMENT  10/18/2010   rt knee   NASAL SEPTUM SURGERY  1984   RIGHT/LEFT HEART CATH AND CORONARY ANGIOGRAPHY N/A 12/22/2020   Procedure: RIGHT/LEFT HEART CATH AND CORONARY ANGIOGRAPHY;  Surgeon: Sherren Mocha, MD;  Location: [place] [facility] LAB;  Service: Cardiovascular;  Laterality: N/A;   thumb surgery Right 2019   TOTAL KNEE REVISION Right 03/16/2022   Procedure: TOTAL KNEE REVISION;  Surgeon: Paralee Cancel, MD;  Location[facility];  Service: Orthopedics;  Laterality: Right;   VASECTOMY  10/18/1985    MEDICATIONS:  acetaminophen (TYLENOL) 500 MG tablet   Ascorbic Acid (VITAMIN C) 1000 MG tablet   aspirin EC 81 MG tablet   atorvastatin (LIPITOR) 80 MG tablet   carvedilol (COREG) 6.25 MG tablet   Cholecalciferol (QC VITAMIN D3) 50 MCG (2000 UT) TABS   diclofenac Sodium (VOLTAREN) 1 % GEL   docusate sodium (COLACE) 100 MG capsule   EDEX 40 MCG injection   empagliflozin (JARDIANCE) 25 MG TABS tablet   glimepiride (AMARYL) 4 MG tablet   Glucose Blood (BLOOD GLUCOSE TEST STRIPS) STRP   hydrOXYzine (ATARAX) 10 MG tablet   lidocaine (XYLOCAINE) 2 % solution   magnesium oxide (MAG-OX) 400 MG tablet   metFORMIN (GLUCOPHAGE) 1000 MG tablet   methocarbamol (ROBAXIN) 500 MG tablet   Multiple Vitamin (MULTIVITAMIN) tablet   nitroGLYCERIN (NITROSTAT) 0.4 MG SL tablet   nystatin cream (MYCOSTATIN)   oxyCODONE (OXY  IR/ROXICODONE) 5 MG immediate release tablet   polyethylene glycol (MIRALAX / GLYCOLAX) 17 g packet   pramipexole (MIRAPEX) 1 MG tablet   sacubitril-valsartan (ENTRESTO) 24-26 MG   Semaglutide, 2 MG/DOSE, (OZEMPIC, 2 MG/DOSE,) 8 MG/3ML SOPN   spironolactone (ALDACTONE) 25 MG tablet   vitamin E 1000 UNIT capsule   zinc gluconate 50 MG tablet   No current facility-administered medications for this encounter.     Konrad Felix Ward, PA-C [facility] [phone]

## 2023-01-18 ENCOUNTER — Encounter (HOSPITAL_COMMUNITY): Admission: RE | Payer: Self-pay | Source: Home / Self Care

## 2023-01-18 ENCOUNTER — Ambulatory Visit (HOSPITAL_COMMUNITY)
Admission: RE | Admit: 2023-01-18 | Payer: No Typology Code available for payment source | Source: Home / Self Care | Admitting: Orthopedic Surgery

## 2023-01-18 DIAGNOSIS — Z01818 Encounter for other preprocedural examination: Secondary | ICD-10-CM

## 2023-01-18 DIAGNOSIS — R7303 Prediabetes: Secondary | ICD-10-CM

## 2023-01-18 SURGERY — ARTHROPLASTY, KNEE, TOTAL
Anesthesia: Spinal | Site: Knee | Laterality: Left

## 2023-01-31 ENCOUNTER — Ambulatory Visit (INDEPENDENT_AMBULATORY_CARE_PROVIDER_SITE_OTHER): Payer: Medicare Other

## 2023-01-31 DIAGNOSIS — I5022 Chronic systolic (congestive) heart failure: Secondary | ICD-10-CM | POA: Diagnosis not present

## 2023-01-31 DIAGNOSIS — Z95810 Presence of automatic (implantable) cardiac defibrillator: Secondary | ICD-10-CM

## 2023-02-01 LAB — CUP PACEART REMOTE DEVICE CHECK
Battery Remaining Longevity: 96 mo
Battery Remaining Percentage: 100 %
Brady Statistic RA Percent Paced: 0 %
Brady Statistic RV Percent Paced: 99 %
Date Time Interrogation Session: 20240415043300
HighPow Impedance: 69 Ohm
Implantable Lead Connection Status: 753985
Implantable Lead Connection Status: 753985
Implantable Lead Connection Status: 753985
Implantable Lead Implant Date: 20230103
Implantable Lead Implant Date: 20230103
Implantable Lead Implant Date: 20230103
Implantable Lead Location: 753858
Implantable Lead Location: 753859
Implantable Lead Location: 753860
Implantable Lead Model: 138
Implantable Lead Model: 3830
Implantable Lead Model: 7841
Implantable Lead Serial Number: 1150746
Implantable Lead Serial Number: 302782
Implantable Pulse Generator Implant Date: 20230103
Lead Channel Impedance Value: 522 Ohm
Lead Channel Impedance Value: 571 Ohm
Lead Channel Impedance Value: 576 Ohm
Lead Channel Pacing Threshold Amplitude: 1.1 V
Lead Channel Pacing Threshold Pulse Width: 0.4 ms
Lead Channel Setting Pacing Amplitude: 2 V
Lead Channel Setting Pacing Amplitude: 2.5 V
Lead Channel Setting Pacing Amplitude: 2.5 V
Lead Channel Setting Pacing Pulse Width: 0.4 ms
Lead Channel Setting Pacing Pulse Width: 0.7 ms
Lead Channel Setting Sensing Sensitivity: 0.6 mV
Lead Channel Setting Sensing Sensitivity: 1 mV
Pulse Gen Serial Number: 151244
Zone Setting Status: 755011

## 2023-02-16 ENCOUNTER — Ambulatory Visit: Payer: TRICARE For Life (TFL) | Admitting: Student

## 2023-03-09 NOTE — Progress Notes (Signed)
Remote ICD transmission.   

## 2023-03-16 NOTE — Progress Notes (Signed)
  Electrophysiology Office Note:   Date:  03/17/2023  ID:  Keith Schwartz, DOB 27-Apr-1951, MRN [id number]  Primary Cardiologist: Little Ishikawa, MD Electrophysiologist: Lewayne Bunting, MD   History of Present Illness:   Keith Schwartz is a 72 y.o. male with h/o CHF, LBBB, and CKD seen today for routine electrophysiology followup. Since last being seen in our clinic the patient reports doing very well.  he denies chest pain, palpitations, dyspnea, PND, orthopnea, nausea, vomiting, dizziness, syncope, edema, weight gain, or early satiety.   Review of systems complete and found to be negative unless listed in HPI.   Device History: [employer] BiV ICD implanted 10/2021 for CHF  Studies Reviewed:    ICD Interrogation-  reviewed in detail today,  See PACEART report.  EKG is not ordered today. EKG from 12/14/2022 reviewed which showed NSR w V pacing at 83 bpm , QRS 164 ms  Physical Exam:   VS:  BP (!) 144/72   Pulse 76   Ht 6\' 3"  (1.905 m)   Wt 232 lb (105.2 kg)   SpO2 96%   BMI 29.00 kg/m    Wt Readings from Last 3 Encounters:  03/17/23 232 lb (105.2 kg)  01/10/23 238 lb (108 kg)  12/14/22 238 lb 12.8 oz (108.3 kg)     GEN: Well nourished, well developed in no acute distress NECK: No JVD; No carotid bruits CARDIAC: Regular rate and rhythm, no murmurs, rubs, gallops RESPIRATORY:  Clear to auscultation without rales, wheezing or rhonchi  ABDOMEN: Soft, non-tender, non-distended EXTREMITIES:  No edema; No deformity   ASSESSMENT AND PLAN:    Chronic systolic dysfunction s/p [employer] CRT-D  Echo 02/2022 LVEF 45-50% euvolemic today Stable on an appropriate medical regimen Normal ICD function See Pace Art report No changes today  CAD Denies s/s ischemia   Disposition:   Follow up with Dr. Ladona Ridgel in 12 months   Signed, Graciella Freer, PA-C

## 2023-03-17 ENCOUNTER — Ambulatory Visit: Payer: Medicare Other | Attending: Student | Admitting: Student

## 2023-03-17 ENCOUNTER — Encounter: Payer: Self-pay | Admitting: Student

## 2023-03-17 VITALS — BP 144/72 | HR 76 | Ht 75.0 in | Wt 232.0 lb

## 2023-03-17 DIAGNOSIS — I255 Ischemic cardiomyopathy: Secondary | ICD-10-CM | POA: Diagnosis not present

## 2023-03-17 DIAGNOSIS — Z95810 Presence of automatic (implantable) cardiac defibrillator: Secondary | ICD-10-CM | POA: Diagnosis not present

## 2023-03-17 DIAGNOSIS — I2510 Atherosclerotic heart disease of native coronary artery without angina pectoris: Secondary | ICD-10-CM

## 2023-03-17 DIAGNOSIS — I5022 Chronic systolic (congestive) heart failure: Secondary | ICD-10-CM

## 2023-03-17 LAB — CUP PACEART INCLINIC DEVICE CHECK
Date Time Interrogation Session: 20240530125640
HighPow Impedance: 67 Ohm
Implantable Lead Connection Status: 753985
Implantable Lead Connection Status: 753985
Implantable Lead Connection Status: 753985
Implantable Lead Implant Date: 20230103
Implantable Lead Implant Date: 20230103
Implantable Lead Implant Date: 20230103
Implantable Lead Location: 753858
Implantable Lead Location: 753859
Implantable Lead Location: 753860
Implantable Lead Model: 138
Implantable Lead Model: 3830
Implantable Lead Model: 7841
Implantable Lead Serial Number: 1150746
Implantable Lead Serial Number: 302782
Implantable Pulse Generator Implant Date: 20230103
Lead Channel Impedance Value: 505 Ohm
Lead Channel Impedance Value: 565 Ohm
Lead Channel Impedance Value: 644 Ohm
Lead Channel Pacing Threshold Amplitude: 0.9 V
Lead Channel Pacing Threshold Amplitude: 1.1 V
Lead Channel Pacing Threshold Amplitude: 1.2 V
Lead Channel Pacing Threshold Pulse Width: 0.4 ms
Lead Channel Pacing Threshold Pulse Width: 0.6 ms
Lead Channel Pacing Threshold Pulse Width: 0.7 ms
Lead Channel Sensing Intrinsic Amplitude: 25 mV
Lead Channel Sensing Intrinsic Amplitude: 3.9 mV
Lead Channel Sensing Intrinsic Amplitude: 6.6 mV
Lead Channel Setting Pacing Amplitude: 2 V
Lead Channel Setting Pacing Amplitude: 2.5 V
Lead Channel Setting Pacing Amplitude: 2.5 V
Lead Channel Setting Pacing Pulse Width: 0.4 ms
Lead Channel Setting Pacing Pulse Width: 0.7 ms
Lead Channel Setting Sensing Sensitivity: 0.6 mV
Lead Channel Setting Sensing Sensitivity: 1 mV
Pulse Gen Serial Number: 151244
Zone Setting Status: 755011

## 2023-03-17 NOTE — Patient Instructions (Signed)
Medication Instructions:  Your physician recommends that you continue on your current medications as directed. Please refer to the Current Medication list given to you today.  *If you need a refill on your cardiac medications before your next appointment, please call your pharmacy*  Lab Work: None ordered If you have labs (blood work) drawn today and your tests are completely normal, you will receive your results only by: MyChart Message (if you have MyChart) OR A paper copy in the mail If you have any lab test that is abnormal or we need to change your treatment, we will call you to review the results.  Follow-Up: At [facility], you and your health needs are our priority.  As part of our continuing mission to provide you with exceptional heart care, we have created designated Provider Care Teams.  These Care Teams include your primary Cardiologist (physician) and Advanced Practice Providers (APPs -  Physician Assistants and Nurse Practitioners) who all work together to provide you with the care you need, when you need it.  Your next appointment:   1 year(s)  Provider:   Gregg Taylor, MD  

## 2023-03-20 ENCOUNTER — Encounter: Payer: Self-pay | Admitting: Cardiology

## 2023-03-22 NOTE — Telephone Encounter (Signed)
Recommend increasing carvedilol to 12.5 mg twice daily.  Would check BP daily for next 2 weeks and let us know results

## 2023-03-25 MED ORDER — CARVEDILOL 12.5 MG PO TABS: 12.5000 mg | ORAL_TABLET | Freq: Two times a day (BID) | ORAL | 3 refills | Status: DC

## 2023-03-30 ENCOUNTER — Telehealth: Payer: Self-pay | Admitting: *Deleted

## 2023-03-30 ENCOUNTER — Encounter: Payer: Self-pay | Admitting: Cardiology

## 2023-03-30 NOTE — Telephone Encounter (Signed)
   Pre-operative Risk Assessment    Patient Name: Keith Schwartz  DOB: 22-Mar-1951 MRN: [id number]      Request for Surgical Clearance    Procedure:   LEFT TOTAL KNEE ARTHROPLASTY  Date of Surgery:  Clearance 05/10/23                                 Surgeon:  DR. MATTHEW OLIN Surgeon's Group or Practice Name:  [facility] Phone number:  [phone] ATTN: Rosalva Ferron Fax number:  [phone]   Type of Clearance Requested:   - Medical ; ASA    Type of Anesthesia:  Spinal   Additional requests/questions:    Elpidio Anis   03/30/2023, 10:14 AM

## 2023-03-30 NOTE — Telephone Encounter (Signed)
Keith Schwartz, patient seen by you on 03/17/23. He was given clearance for knee surgery back in February but the procedure was cancelled due to A1C 8.3. Below is Robet Leu, [place] assessment from 12/14/22. Do you feel comfortable to agree that he is well compensated, or do you recommend we schedule a follow-up virtual visit?  Please route your response to p cv div preop.  Preoperative evaluation - Patient is scheduled for L knee surgery on 01/18/23. Note, he recently underwent R knee surgery on 03/16/22 and did not have any cardiac issues perioperatively  - RCRI score 2 given patient's history of CAD, CHF. This means he has a 10.1% 30-day risk of death, MI, or cardiac arrest - While patient is a moderate risk for surgery given his risk factors, he is well compensated. Functional capacity is greater than 4 mets. Patient remains physically active and is able to do a few hours of yard work without chest pain or SOB  - Patient understands that he is at an increased risk for surgery, but knee pain severely affects his quality of life.  - No further cardiac workup prior to surgery needed - Patient may hold aspirin for 5-7 days pre-operatively. Aspirin should be resumed post-operatively once adequate hemostasis is achieved    Thanks, [employer]

## 2023-03-30 NOTE — Telephone Encounter (Signed)
   Primary Cardiologist: Little Ishikawa, MD  Chart reviewed as part of pre-operative protocol coverage.   Preoperative evaluation - Patient is scheduled for left knee surgery. Note, he recently underwent R knee surgery on 03/16/22 and did not have any cardiac issues perioperatively  - RCRI score 2 given patient's history of CAD, CHF. This means he has a 10.1% 30-day risk of death, MI, or cardiac arrest - While patient is a moderate risk for surgery given his risk factors, he is well compensated. Functional capacity is greater than 4 mets. Patient remains physically active and is able to do a few hours of yard work without chest pain or SOB  - Patient understands that he is at an increased risk for surgery, but knee pain severely affects his quality of life.  - No further cardiac workup prior to surgery needed - Patient may hold aspirin for 5-7 days pre-operatively. Aspirin should be resumed post-operatively once adequate hemostasis is achieved    Given past medical history and time since last visit, based on ACC/AHA guidelines, Keith Schwartz would be at acceptable risk for the planned procedure without further cardiovascular testing.   Patient was advised that if he develops new symptoms prior to surgery to contact our office to arrange a follow-up appointment.  He verbalized understanding.  I will route this recommendation to the requesting party via Epic fax function and remove from pre-op pool.  Please call with questions.  Levi Aland, NP-C  03/30/2023, 4:20 PM [address], [address] Office [phone] Fax [phone]

## 2023-05-02 ENCOUNTER — Ambulatory Visit: Payer: Medicare Other

## 2023-05-02 DIAGNOSIS — Z95810 Presence of automatic (implantable) cardiac defibrillator: Secondary | ICD-10-CM

## 2023-05-02 DIAGNOSIS — I255 Ischemic cardiomyopathy: Secondary | ICD-10-CM

## 2023-05-02 NOTE — Patient Instructions (Signed)
SURGICAL WAITING ROOM VISITATION  Patients having surgery or a procedure may have no more than 2 support people in the waiting area - these visitors may rotate.    Children under the age of 57 must have an adult with them who is not the patient.  Due to an increase in RSV and influenza rates and associated hospitalizations, children ages 61 and under may not visit patients in [facility] hospitals.  If the patient needs to stay at the hospital during part of their recovery, the visitor guidelines for inpatient rooms apply. Pre-op nurse will coordinate an appropriate time for 1 support person to accompany patient in pre-op.  This support person may not rotate.    Please refer to the [facility] website for the visitor guidelines for Inpatients (after your surgery is over and you are in a regular room).       Your procedure is scheduled on: 05/10/23   Report to [facility] Main Entrance    Report to admitting at 5:15 AM   Call this number if you have problems the morning of surgery [phone]   Do not eat food :After Midnight.   After Midnight you may have the following liquids until 4:15 AM DAY OF SURGERY  Water Non-Citrus Juices (without pulp, NO RED-Apple, White grape, White cranberry) Black Coffee (NO MILK/CREAM OR CREAMERS, sugar ok)  Clear Tea (NO MILK/CREAM OR CREAMERS, sugar ok) regular and decaf                             Plain Jell-O (NO RED)                                           Fruit ices (not with fruit pulp, NO RED)                                     Popsicles (NO RED)                                                               Sports drinks like Gatorade (NO RED)                  The day of surgery:  Drink ONE (1) Pre-Surgery G2 at 4:15 AM the morning of surgery. Drink in one sitting. Do not sip.  This drink was given to you during your hospital  pre-op appointment visit. Nothing else to drink after completing the  Pre-SurgeryG2.     Oral  Hygiene is also important to reduce your risk of infection.                                    Remember - BRUSH YOUR TEETH THE MORNING OF SURGERY WITH YOUR REGULAR TOOTHPASTE  DENTURES WILL BE REMOVED PRIOR TO SURGERY PLEASE DO NOT APPLY "Poly grip" OR ADHESIVES!!!   Do NOT smoke after Midnight   Take these medicines the morning of surgery: Tylenol if neede, Atorvastatin, Carvedilol,  Spironolactone   DO NOT TAKE ANY ORAL DIABETIC MEDICATIONS DAY OF YOUR SURGERY. Hold Glimepiride and Metformin the day of surgery.  Bring CPAP mask and tubing day of surgery.                              You may not have any metal on your body including hair pins, jewelry, and body piercing             Do not wear make-up, lotions, powders, perfumes/cologne, or deodorant              Men may shave face and neck.   Do not bring valuables to the hospital. [facility] IS NOT             RESPONSIBLE   FOR VALUABLES.   Contacts, glasses, dentures or bridgework may not be worn into surgery.   Bring small overnight bag day of surgery.   DO NOT BRING YOUR HOME MEDICATIONS TO THE HOSPITAL. PHARMACY WILL DISPENSE MEDICATIONS LISTED ON YOUR MEDICATION LIST TO YOU DURING YOUR ADMISSION IN THE HOSPITAL!    Patients discharged on the day of surgery will not be allowed to drive home.  Someone NEEDS to stay with you for the first 24 hours after anesthesia.   Special Instructions: Bring a copy of your healthcare power of attorney and living will documents the day of surgery if you haven't scanned them before.              Please read over the following fact sheets you were given: IF YOU HAVE QUESTIONS ABOUT YOUR PRE-OP INSTRUCTIONS PLEASE CALL [phone]   If you received a COVID test during your pre-op visit  it is requested that you wear a mask when out in public, stay away from anyone that may not be feeling well and notify your surgeon if you develop symptoms. If you test positive for Covid or have been in  contact with anyone that has tested positive in the last 10 days please notify you surgeon.      Pre-operative 5 CHG Bath Instructions   You can play a key role in reducing the risk of infection after surgery. Your skin needs to be as free of germs as possible. You can reduce the number of germs on your skin by washing with CHG (chlorhexidine gluconate) soap before surgery. CHG is an antiseptic soap that kills germs and continues to kill germs even after washing.   DO NOT use if you have an allergy to chlorhexidine/CHG or antibacterial soaps. If your skin becomes reddened or irritated, stop using the CHG and notify one of our RNs at [phone].   Please shower with the CHG soap starting 4 days before surgery using the following schedule:     Please keep in mind the following:  DO NOT shave, including legs and underarms, starting the day of your first shower.   You may shave your face at any point before/day of surgery.  Place clean sheets on your bed the day you start using CHG soap. Use a clean washcloth (not used since being washed) for each shower. DO NOT sleep with pets once you start using the CHG.   CHG Shower Instructions:  If you choose to wash your hair and private area, wash first with your normal shampoo/soap.  After you use shampoo/soap, rinse your hair and body thoroughly to remove shampoo/soap residue.  Turn the water OFF and apply  about 3 tablespoons (45 ml) of CHG soap to a CLEAN washcloth.  Apply CHG soap ONLY FROM YOUR NECK DOWN TO YOUR TOES (washing for 3-5 minutes)  DO NOT use CHG soap on face, private areas, open wounds, or sores.  Pay special attention to the area where your surgery is being performed.  If you are having back surgery, having someone wash your back for you may be helpful. Wait 2 minutes after CHG soap is applied, then you may rinse off the CHG soap.  Pat dry with a clean towel  Put on clean clothes/pajamas   If you choose to wear lotion, please  use ONLY the CHG-compatible lotions on the back of this paper.     Additional instructions for the day of surgery: DO NOT APPLY any lotions, deodorants, cologne, or perfumes.   Put on clean/comfortable clothes.  Brush your teeth.  Ask your nurse before applying any prescription medications to the skin.      CHG Compatible Lotions   Aveeno Moisturizing lotion  Cetaphil Moisturizing Cream  Cetaphil Moisturizing Lotion  Clairol Herbal Essence Moisturizing Lotion, Dry Skin  Clairol Herbal Essence Moisturizing Lotion, Extra Dry Skin  Clairol Herbal Essence Moisturizing Lotion, Normal Skin  Curel Age Defying Therapeutic Moisturizing Lotion with Alpha Hydroxy  Curel Extreme Care Body Lotion  Curel Soothing Hands Moisturizing Hand Lotion  Curel Therapeutic Moisturizing Cream, Fragrance-Free  Curel Therapeutic Moisturizing Lotion, Fragrance-Free  Curel Therapeutic Moisturizing Lotion, Original Formula  Eucerin Daily Replenishing Lotion  Eucerin Dry Skin Therapy Plus Alpha Hydroxy Crme  Eucerin Dry Skin Therapy Plus Alpha Hydroxy Lotion  Eucerin Original Crme  Eucerin Original Lotion  Eucerin Plus Crme Eucerin Plus Lotion  Eucerin TriLipid Replenishing Lotion  Keri Anti-Bacterial Hand Lotion  Keri Deep Conditioning Original Lotion Dry Skin Formula Softly Scented  Keri Deep Conditioning Original Lotion, Fragrance Free Sensitive Skin Formula  Keri Lotion Fast Absorbing Fragrance Free Sensitive Skin Formula  Keri Lotion Fast Absorbing Softly Scented Dry Skin Formula  Keri Original Lotion  Keri Skin Renewal Lotion Keri Silky Smooth Lotion  Keri Silky Smooth Sensitive Skin Lotion  Nivea Body Creamy Conditioning Oil  Nivea Body Extra Enriched Lotion  Nivea Body Original Lotion  Nivea Body Sheer Moisturizing Lotion Nivea Crme  Nivea Skin Firming Lotion  NutraDerm 30 Skin Lotion  NutraDerm Skin Lotion  NutraDerm Therapeutic Skin Cream  NutraDerm Therapeutic Skin Lotion  ProShield  Protective Hand Cream  Provon moisturizing lotion How to Manage Your Diabetes Before and After Surgery  Why is it important to control my blood sugar before and after surgery? Improving blood sugar levels before and after surgery helps healing and can limit problems. A way of improving blood sugar control is eating a healthy diet by:  Eating less sugar and carbohydrates  Increasing activity/exercise  Talking with your doctor about reaching your blood sugar goals High blood sugars (greater than 180 mg/dL) can raise your risk of infections and slow your recovery, so you will need to focus on controlling your diabetes during the weeks before surgery. Make sure that the doctor who takes care of your diabetes knows about your planned surgery including the date and location.  How do I manage my blood sugar before surgery? Check your blood sugar at least 4 times a day, starting 2 days before surgery, to make sure that the level is not too high or low. Check your blood sugar the morning of your surgery when you wake up and every 2 hours until you get  to the Short Stay unit. If your blood sugar is less than 70 mg/dL, you will need to treat for low blood sugar: Do not take insulin. Treat a low blood sugar (less than 70 mg/dL) with  cup of clear juice (cranberry or apple), 4 glucose tablets, OR glucose gel. Recheck blood sugar in 15 minutes after treatment (to make sure it is greater than 70 mg/dL). If your blood sugar is not greater than 70 mg/dL on recheck, call [phone] for further instructions. Report your blood sugar to the short stay nurse when you get to Short Stay.  If you are admitted to the hospital after surgery: Your blood sugar will be checked by the staff and you will probably be given insulin after surgery (instead of oral diabetes medicines) to make sure you have good blood sugar levels. The goal for blood sugar control after surgery is 80-180 mg/dL.   WHAT DO I DO ABOUT MY  DIABETES MEDICATION?  Do not take oral diabetes medicines (pills) the morning of surgery. Hold Metformin and Glimepiride the day of surgery.   DO NOT TAKE THE FOLLOWING 7 DAYS PRIOR TO SURGERY: Ozempic, Wegovy, Rybelsus (Semaglutide), Byetta (exenatide), Bydureon (exenatide ER), Victoza, Saxenda (liraglutide), or Trulicity (dulaglutide) Mounjaro (Tirzepatide) Adlyxin (Lixisenatide), Polyethylene Glycol Loxenatide.  Patient Signature:  Date:   Nurse Signature:  Date:

## 2023-05-02 NOTE — Progress Notes (Addendum)
COVID Vaccine received:  []  No []  Yes Date of any COVID positive Test in last 90 days:  PCP - Rodena Goldmann MD Cardiologist - Epifanio Lesches MD Electrophys Lewayne Bunting MD   Cardiac clearance 03/30/23 Eligha Bridegroom NP  Chest x-ray -  EKG -   Stress Test -  ECHO - 03/10/22 EPIC Cardiac Cath - 12/22/20  EPIC  Bowel Prep - [x]  No  []   Yes ______  Pacemaker / ICD device []  No [x]  Yes  10/20/21 Spinal Cord Stimulator:[]  No []  Yes       History of Sleep Apnea? []  No []  Yes   CPAP used?- []  No []  Yes    Does the patient monitor blood sugar?          []  No [x]  Yes  []  N/A  Patient has: []  NO Hx DM   []  Pre-DM                 []  DM1  [x]   DM2 Does patient have a [employer] or Dexacom? []  No []  Yes   Fasting Blood Sugar Ranges-  Checks Blood Sugar _____ times a day  GLP1 agonist / usual dose - Ozempic GLP1 instructions:  SGLT-2 inhibitors / usual dose - no SGLT-2 instructions:   Blood Thinner / Instructions:no Aspirin Instructions: Hold 81mg  ASA for 5-7 days before surgery per Eligha Bridegroom PA Comments:   Activity level: Patient is able to climb a flight of stairs without difficulty; []  No CP  []  No SOB, but would have ___   Patient can perform ADLs without assistance.   Anesthesia review: DM2, HTN, CAD, Biventricular ICD, L BBB  Patient denies shortness of breath, fever, cough and chest pain at PAT appointment.  Patient verbalized understanding and agreement to the Pre-Surgical Instructions that were given to them at this PAT appointment. Patient was also educated of the need to review these PAT instructions again prior to his/her surgery.I reviewed the appropriate phone numbers to call if they have any and questions or concerns.

## 2023-05-03 LAB — CUP PACEART REMOTE DEVICE CHECK
Battery Remaining Longevity: 96 mo
Battery Remaining Percentage: 100 %
Brady Statistic RA Percent Paced: 0 %
Brady Statistic RV Percent Paced: 99 %
Date Time Interrogation Session: 20240715065400
HighPow Impedance: 61 Ohm
Implantable Lead Connection Status: 753985
Implantable Lead Connection Status: 753985
Implantable Lead Connection Status: 753985
Implantable Lead Implant Date: 20230103
Implantable Lead Implant Date: 20230103
Implantable Lead Implant Date: 20230103
Implantable Lead Location: 753858
Implantable Lead Location: 753859
Implantable Lead Location: 753860
Implantable Lead Model: 138
Implantable Lead Model: 3830
Implantable Lead Model: 7841
Implantable Lead Serial Number: 1150746
Implantable Lead Serial Number: 302782
Implantable Pulse Generator Implant Date: 20230103
Lead Channel Impedance Value: 473 Ohm
Lead Channel Impedance Value: 551 Ohm
Lead Channel Impedance Value: 671 Ohm
Lead Channel Pacing Threshold Amplitude: 1 V
Lead Channel Pacing Threshold Pulse Width: 0.4 ms
Lead Channel Setting Pacing Amplitude: 2 V
Lead Channel Setting Pacing Amplitude: 2.5 V
Lead Channel Setting Pacing Amplitude: 2.5 V
Lead Channel Setting Pacing Pulse Width: 0.4 ms
Lead Channel Setting Pacing Pulse Width: 0.7 ms
Lead Channel Setting Sensing Sensitivity: 0.6 mV
Lead Channel Setting Sensing Sensitivity: 1 mV
Pulse Gen Serial Number: 151244
Zone Setting Status: 755011

## 2023-05-04 ENCOUNTER — Encounter (HOSPITAL_COMMUNITY): Payer: Self-pay

## 2023-05-04 ENCOUNTER — Encounter (HOSPITAL_COMMUNITY)
Admission: RE | Admit: 2023-05-04 | Discharge: 2023-05-04 | Disposition: A | Discharge status: Home / Self Care | Payer: No Typology Code available for payment source | Source: Ambulatory Visit | Attending: Orthopedic Surgery | Admitting: Orthopedic Surgery

## 2023-05-04 ENCOUNTER — Other Ambulatory Visit: Payer: Self-pay

## 2023-05-04 VITALS — BP 126/64 | HR 80 | Temp 97.8°F | Resp 16 | Ht 75.0 in | Wt 230.0 lb

## 2023-05-04 DIAGNOSIS — M1712 Unilateral primary osteoarthritis, left knee: Secondary | ICD-10-CM | POA: Insufficient documentation

## 2023-05-04 DIAGNOSIS — E119 Type 2 diabetes mellitus without complications: Secondary | ICD-10-CM | POA: Diagnosis not present

## 2023-05-04 DIAGNOSIS — Z01818 Encounter for other preprocedural examination: Secondary | ICD-10-CM | POA: Diagnosis present

## 2023-05-04 DIAGNOSIS — I110 Hypertensive heart disease with heart failure: Secondary | ICD-10-CM | POA: Insufficient documentation

## 2023-05-04 DIAGNOSIS — Z95810 Presence of automatic (implantable) cardiac defibrillator: Secondary | ICD-10-CM | POA: Diagnosis not present

## 2023-05-04 DIAGNOSIS — I255 Ischemic cardiomyopathy: Secondary | ICD-10-CM | POA: Insufficient documentation

## 2023-05-04 DIAGNOSIS — G4730 Sleep apnea, unspecified: Secondary | ICD-10-CM | POA: Diagnosis not present

## 2023-05-04 DIAGNOSIS — I252 Old myocardial infarction: Secondary | ICD-10-CM | POA: Diagnosis not present

## 2023-05-04 DIAGNOSIS — I2510 Atherosclerotic heart disease of native coronary artery without angina pectoris: Secondary | ICD-10-CM | POA: Insufficient documentation

## 2023-05-04 DIAGNOSIS — I447 Left bundle-branch block, unspecified: Secondary | ICD-10-CM | POA: Diagnosis not present

## 2023-05-04 DIAGNOSIS — I10 Essential (primary) hypertension: Secondary | ICD-10-CM

## 2023-05-04 LAB — CBC
HCT: 45.4 % (ref 39.0–52.0)
Hemoglobin: 14.8 g/dL (ref 13.0–17.0)
MCH: 30.3 pg (ref 26.0–34.0)
MCHC: 32.6 g/dL (ref 30.0–36.0)
MCV: 92.8 fL (ref 80.0–100.0)
Platelets: 181 10*3/uL (ref 150–400)
RBC: 4.89 MIL/uL (ref 4.22–5.81)
RDW: 13.1 % (ref 11.5–15.5)
WBC: 9 10*3/uL (ref 4.0–10.5)
nRBC: 0 % (ref 0.0–0.2)

## 2023-05-04 LAB — BASIC METABOLIC PANEL
Anion gap: 8 (ref 5–15)
BUN: 37 mg/dL — ABNORMAL HIGH (ref 8–23)
CO2: 24 mmol/L (ref 22–32)
Calcium: 9.5 mg/dL (ref 8.9–10.3)
Chloride: 104 mmol/L (ref 98–111)
Creatinine, Ser: 1.29 mg/dL — ABNORMAL HIGH (ref 0.61–1.24)
GFR, Estimated: 59 mL/min — ABNORMAL LOW (ref >60)
Glucose, Bld: 121 mg/dL — ABNORMAL HIGH (ref 70–99)
Potassium: 4.1 mmol/L (ref 3.5–5.1)
Sodium: 136 mmol/L (ref 135–145)

## 2023-05-04 LAB — SURGICAL PCR SCREEN
MRSA, PCR: NEGATIVE
Staphylococcus aureus: NEGATIVE

## 2023-05-04 LAB — GLUCOSE, CAPILLARY: Glucose-Capillary: 119 mg/dL — ABNORMAL HIGH (ref 70–99)

## 2023-05-05 NOTE — Progress Notes (Signed)
Choose an anesthesia record to view details        DISCUSSION: Keith Schwartz is a 72 y.o. never smoker with h/o HTN, sleep apnea, DM II, CAD, CHF, LBBB, AICD in place (device orders in 01/06/2023 progress note), left knee OA scheduled for left TKA on 05/10/23 with Dr. Durene Romans. Patient was originally scheduled for surgery on 01/18/23 however rescheduled due to HgA1c being 8.3. Repeat HgA1c on 03/22/23 was 6.8.    Patient follows with Cardiology for hx of CAD, CHF. Risk assessment provided on 03/30/23:   "Preoperative evaluation - Patient is scheduled for left knee surgery. Note, he recently underwent R knee surgery on 03/16/22 and did not have any cardiac issues perioperatively  - RCRI score 2 given patient's history of CAD, CHF. This means he has a 10.1% 30-day risk of death, MI, or cardiac arrest - While patient is a moderate risk for surgery given his risk factors, he is well compensated. Functional capacity is greater than 4 mets. Patient remains physically active and is able to do a few hours of yard work without chest pain or SOB  - Patient understands that he is at an increased risk for surgery, but knee pain severely affects his quality of life.  - No further cardiac workup prior to surgery needed - Patient may hold aspirin for 5-7 days pre-operatively. Aspirin should be resumed post-operatively once adequate hemostasis is achieved     Given past medical history and time since last visit, based on ACC/AHA guidelines, Keith Schwartz would be at acceptable risk for the planned procedure without further cardiovascular testing."  Last device check was 05/02/23  VS: BP 126/64   Pulse 80   Temp 36.6 C (Oral)   Resp 16   Ht 6\' 3"  (1.905 m)   Wt 104.3 kg   SpO2 94%   BMI 28.75 kg/m   PROVIDERS: Tenna Delaine, MD   LABS: Labs reviewed: Acceptable for surgery. (all labs ordered are listed, but only abnormal results are displayed)  Labs Reviewed  BASIC METABOLIC PANEL -  Abnormal; Notable for the following components:      Result Value   Glucose, Bld 121 (*)    BUN 37 (*)    Creatinine, Ser 1.29 (*)    GFR, Estimated 59 (*)    All other components within normal limits  GLUCOSE, CAPILLARY - Abnormal; Notable for the following components:   Glucose-Capillary 119 (*)    All other components within normal limits  SURGICAL PCR SCREEN  CBC     IMAGES: n/a   EKG: n/a   CV:  Echo 03/10/2022 1. Left ventricular ejection fraction, by estimation, is 45 to 50%. The  left ventricle has mildly decreased function. The left ventricle  demonstrates regional wall motion abnormalities (see scoring  diagram/findings for description). There is mild left  ventricular hypertrophy of the basal-septal segment. Left ventricular  diastolic parameters are consistent with Grade I diastolic dysfunction  (impaired relaxation). There is mild hypokinesis of the left ventricular,  mid-apical anteroseptal wall.   2. Right ventricular systolic function is normal. The right ventricular  size is normal. There is normal pulmonary artery systolic pressure.   3. The mitral valve is normal in structure. No evidence of mitral valve  regurgitation. No evidence of mitral stenosis.   4. The aortic valve is normal in structure. There is mild calcification  of the aortic valve. There is mild thickening of the aortic valve. Aortic  valve regurgitation is not visualized.  No aortic stenosis is present.   5. The inferior vena cava is normal in size with greater than 50%  respiratory variability, suggesting right atrial pressure of 3 mmHg.  Right/Left Heart Cath 12/22/2020:  1.  Essentially normal right heart pressures with preserved cardiac output, normal pulmonary artery pressure, and normal wedge pressure. 2.  Patent left main with mild nonobstructive irregularity 3.  Diffusely diseased LAD from the level of the first diagonal all the way to the apex with positive RFR 4.  Nonobstructive  left circumflex stenosis 5.  Mild diffuse RCA plaquing without any high-grade stenosis   Recommend initial medical therapy for cardiomyopathy and coronary artery disease with angina.  The diffuse LAD stenosis could be treated with PCI, but would involve a very long length of stenting which comes with higher risk of restenosis in this diabetic gentleman.  He is not an ideal candidate for CABG as his mammary artery would have to be tied to the apical portion of the LAD.  Favor medical therapy.   Past Medical History:  Diagnosis Date   AICD (automatic cardioverter/defibrillator) present    Arthritis    CHF (congestive heart failure) (HCC)    Coronary artery disease    Diabetes mellitus without complication (HCC) 10/18/2000   History of kidney stones    Hypertension 10/18/2000   ICD (implantable cardioverter-defibrillator) in place    Ischemic cardiomyopathy    Myocardial infarction [facility])    Sleep apnea    uses cpap at times    Past Surgical History:  Procedure Laterality Date   APPENDECTOMY  10/18/1968   BIV ICD INSERTION CRT-D N/A 10/20/2021   Procedure: BIV ICD INSERTION CRT-D;  Surgeon: Marinus Maw, MD;  Location[facility] INVASIVE CV LAB;  Service: Cardiovascular;  Laterality: N/A;   CORONARY PRESSURE/FFR STUDY N/A 12/22/2020   Procedure: INTRAVASCULAR PRESSURE WIRE/FFR STUDY;  Surgeon: Tonny Bollman, MD;  Location: [facility] INVASIVE CV LAB;  Service: Cardiovascular;  Laterality: N/A;   CYSTOSCOPY W/ STONE MANIPULATION Left 1993   HERNIA REPAIR Bilateral 1976   groin and 1979   JOINT REPLACEMENT  10/18/2010   rt knee   NASAL SEPTUM SURGERY  1984   RIGHT/LEFT HEART CATH AND CORONARY ANGIOGRAPHY N/A 12/22/2020   Procedure: RIGHT/LEFT HEART CATH AND CORONARY ANGIOGRAPHY;  Surgeon: Tonny Bollman, MD;  Location[facility] INVASIVE CV LAB;  Service: Cardiovascular;  Laterality: N/A;   thumb surgery Right 2019   TOTAL KNEE REVISION Right 03/16/2022   Procedure: TOTAL KNEE REVISION;  Surgeon:  Durene Romans, MD;  Location: [facility] ORS;  Service: Orthopedics;  Laterality: Right;   VASECTOMY  10/18/1985    MEDICATIONS:  acetaminophen (TYLENOL) 500 MG tablet   Ascorbic Acid (VITAMIN C) 1000 MG tablet   aspirin EC 81 MG tablet   atorvastatin (LIPITOR) 80 MG tablet   carvedilol (COREG) 12.5 MG tablet   cholecalciferol (VITAMIN D3) 25 MCG (1000 UNIT) tablet   diclofenac Sodium (VOLTAREN) 1 % GEL   EDEX 40 MCG injection   glimepiride (AMARYL) 4 MG tablet   Glucose Blood (BLOOD GLUCOSE TEST STRIPS) STRP   hydrOXYzine (ATARAX) 10 MG tablet   ketoconazole (NIZORAL) 2 % cream   lidocaine (XYLOCAINE) 2 % solution   loperamide (IMODIUM A-D) 2 MG tablet   Magnesium 500 MG CAPS   metFORMIN (GLUCOPHAGE-XR) 500 MG 24 hr tablet   methocarbamol (ROBAXIN) 500 MG tablet   Multiple Vitamin (MULTIVITAMIN) tablet   nitroGLYCERIN (NITROSTAT) 0.4 MG SL tablet   pramipexole (MIRAPEX) 1 MG tablet  sacubitril-valsartan (ENTRESTO) 24-26 MG   Semaglutide, 2 MG/DOSE, (OZEMPIC, 2 MG/DOSE,) 8 MG/3ML SOPN   senna-docusate (SENOKOT-S) 8.6-50 MG tablet   spironolactone (ALDACTONE) 25 MG tablet   zinc gluconate 50 MG tablet   No current facility-administered medications for this encounter.   Keith Schwartz [facility] Surgical Short Stay/Anesthesiology [facility] Phone [phone] 05/09/2023 12:48 PM

## 2023-05-09 NOTE — H&P (Signed)
TOTAL KNEE ADMISSION H&P  Patient is being admitted for left total knee arthroplasty.  Therapy Plans: outpatient therapy at [facility] in [place] Disposition: Home with wife Planned DVT Prophylaxis: aspirin 81mg  BID DME needed: none PCP: Dr. Jamey Reas - clearance recieved Cardio: Dr. Darryl Nestle - (will ask for clearance) TXA: IV Allergies: PCN - childhood, glipizide/glyburide - hives/rash Anesthesia Concerns: none BMI: 29.5 Last HgbA1c: 6.8% on 03/22/23   Other: - last ozempic dose was 04/28/23 - Hx of CAD, CHF - CKD - No hx of VTE or cancer - oxycodone, robaxin, tylenol  Subjective:  Chief Complaint:left knee pain.  HPI: Keith Schwartz, 72 y.o. male, has a history of pain and functional disability in the left knee due to arthritis and has failed non-surgical conservative treatments for greater than 12 weeks to includeNSAID's and/or analgesics, corticosteriod injections, and activity modification.  Onset of symptoms was gradual, starting 2 years ago with gradually worsening course since that time. The patient noted no past surgery on the left knee(s).  Patient currently rates pain in the left knee(s) at 8 out of 10 with activity. Patient has worsening of pain with activity and weight bearing, pain that interferes with activities of daily living, and pain with passive range of motion.  Patient has evidence of joint space narrowing by imaging studies. There is no active infection.  Patient Active Problem List   Diagnosis Date Noted   S/P revision of total knee, right 03/16/2022   Biventricular ICD (implantable cardioverter-defibrillator) in place 01/21/2022   Chronic systolic heart failure (HCC) 02/05/2021   Coronary artery disease involving native coronary artery with angina pectoris (HCC) 12/22/2020   Gout 05/16/2017   Mixed hyperlipidemia 10/27/2015   Erectile dysfunction 10/27/2015   Testosterone deficiency 10/27/2015   Type 2 or unspecified type diabetes mellitus    Hypertension     Past Medical History:  Diagnosis Date   AICD (automatic cardioverter/defibrillator) present    Arthritis    CHF (congestive heart failure) (HCC)    Coronary artery disease    Diabetes mellitus without complication (HCC) 10/18/2000   History of kidney stones    Hypertension 10/18/2000   ICD (implantable cardioverter-defibrillator) in place    Ischemic cardiomyopathy    Myocardial infarction [facility])    Sleep apnea    uses cpap at times    Past Surgical History:  Procedure Laterality Date   APPENDECTOMY  10/18/1968   BIV ICD INSERTION CRT-D N/A 10/20/2021   Procedure: BIV ICD INSERTION CRT-D;  Surgeon: Marinus Maw, MD;  Location: [facility] INVASIVE CV LAB;  Service: Cardiovascular;  Laterality: N/A;   CORONARY PRESSURE/FFR STUDY N/A 12/22/2020   Procedure: INTRAVASCULAR PRESSURE WIRE/FFR STUDY;  Surgeon: Tonny Bollman, MD;  Location: [facility] INVASIVE CV LAB;  Service: Cardiovascular;  Laterality: N/A;   CYSTOSCOPY W/ STONE MANIPULATION Left 1993   HERNIA REPAIR Bilateral 1976   groin and 1979   JOINT REPLACEMENT  10/18/2010   rt knee   NASAL SEPTUM SURGERY  1984   RIGHT/LEFT HEART CATH AND CORONARY ANGIOGRAPHY N/A 12/22/2020   Procedure: RIGHT/LEFT HEART CATH AND CORONARY ANGIOGRAPHY;  Surgeon: Tonny Bollman, MD;  Location: [facility] INVASIVE CV LAB;  Service: Cardiovascular;  Laterality: N/A;   thumb surgery Right 2019   TOTAL KNEE REVISION Right 03/16/2022   Procedure: TOTAL KNEE REVISION;  Surgeon: Durene Romans, MD;  Location: [facility] ORS;  Service: Orthopedics;  Laterality: Right;   VASECTOMY  10/18/1985    No current facility-administered medications for this encounter.   Current Outpatient  Medications  Medication Sig Dispense Refill Last Dose   acetaminophen (TYLENOL) 500 MG tablet Take 500-1,000 mg by mouth every 6 (six) hours as needed for moderate pain.      Ascorbic Acid (VITAMIN C) 1000 MG tablet Take 1,000 mg by mouth in the morning.      aspirin EC 81 MG tablet Take 81 mg by  mouth daily.      atorvastatin (LIPITOR) 80 MG tablet Take 1 tablet (80 mg total) by mouth daily. 30 tablet 0    carvedilol (COREG) 12.5 MG tablet Take 1 tablet (12.5 mg total) by mouth 2 (two) times daily. 180 tablet 3    cholecalciferol (VITAMIN D3) 25 MCG (1000 UNIT) tablet Take 1,000 Units by mouth daily.      diclofenac Sodium (VOLTAREN) 1 % GEL Apply 2 g topically 4 (four) times daily as needed (pain).      EDEX 40 MCG injection 40 mcg by Intracavitary route as needed for erectile dysfunction.      glimepiride (AMARYL) 4 MG tablet Take 4 mg by mouth daily.      hydrOXYzine (ATARAX) 10 MG tablet Take 10 mg by mouth every 8 (eight) hours as needed for itching.      ketoconazole (NIZORAL) 2 % cream Apply 1 Application topically daily as needed for irritation.      lidocaine (XYLOCAINE) 2 % solution Use as directed 15 mLs in the mouth or throat as needed for mouth pain. (Patient taking differently: Use as directed 15 mLs in the mouth or throat as needed (sore throat).) 100 mL 0    loperamide (IMODIUM A-D) 2 MG tablet Take 2-4 mg by mouth 4 (four) times daily as needed for diarrhea or loose stools.      Magnesium 500 MG CAPS Take 500 mg by mouth daily.      metFORMIN (GLUCOPHAGE-XR) 500 MG 24 hr tablet Take 1,000 mg by mouth 2 (two) times daily.      methocarbamol (ROBAXIN) 500 MG tablet Take 1 tablet (500 mg total) by mouth every 6 (six) hours as needed for muscle spasms. 40 tablet 0    Multiple Vitamin (MULTIVITAMIN) tablet Take 1 tablet by mouth in the morning. MEN'S ONE A DAY 50+      nitroGLYCERIN (NITROSTAT) 0.4 MG SL tablet Place 1 tablet (0.4 mg total) under the tongue every 5 (five) minutes as needed for chest pain. 25 tablet 2    pramipexole (MIRAPEX) 1 MG tablet Take 1 mg by mouth at bedtime.      sacubitril-valsartan (ENTRESTO) 24-26 MG Take 1 tablet by mouth 2 (two) times daily. 180 tablet 3    Semaglutide, 2 MG/DOSE, (OZEMPIC, 2 MG/DOSE,) 8 MG/3ML SOPN Inject 2 mg into the skin every  Thursday.      senna-docusate (SENOKOT-S) 8.6-50 MG tablet Take 1-2 tablets by mouth daily as needed for mild constipation.      spironolactone (ALDACTONE) 25 MG tablet TAKE 1/2 TABLET(12.5 MG) BY MOUTH DAILY 45 tablet 6    zinc gluconate 50 MG tablet Take 50 mg by mouth daily.      Glucose Blood (BLOOD GLUCOSE TEST STRIPS) STRP Please dispense based on patient and insurance preference. Use as directed to monitor FSBS 3x daily. Dx: Z6109. 200 strip 2    Allergies  Allergen Reactions   Glipizide Hives and Rash   Penicillins Rash    Add as: Penicillin G/ Childhood Tolerated Cephalosporin Date: 03/16/22.      Social History   Tobacco Use  Smoking status: Never   Smokeless tobacco: Never  Substance Use Topics   Alcohol use: Not Currently    Alcohol/week: 1.0 standard drink of alcohol    Types: 1 Cans of beer per week    Family History  Problem Relation Age of Onset   Arthritis Mother    Asthma Mother    Hearing loss Mother    Vision loss Mother    Heart disease Father    Hyperlipidemia Father    Hypertension Father    Early death Sister      Review of Systems  Constitutional:  Negative for chills and fever.  Respiratory:  Negative for cough and shortness of breath.   Cardiovascular:  Negative for chest pain.  Gastrointestinal:  Negative for nausea and vomiting.  Musculoskeletal:  Positive for arthralgias.     Objective:  Physical Exam Well nourished and well developed. General: Alert and oriented x3, cooperative and pleasant, no acute distress. Head: normocephalic, atraumatic, neck supple. Eyes: EOMI.  Musculoskeletal: Left knee exam: No palpable effusion, warmth erythema Tenderness medially No significant flexion contracture noted with flexion to 120 degrees with crepitation and tightness anteriorly   Calves soft and nontender. Motor function intact in LE. Strength 5/5 LE bilaterally. Neuro: Distal pulses 2+. Sensation to light touch intact in LE.  Vital  signs in last 24 hours:    Labs:   Estimated body mass index is 28.75 kg/m as calculated from the following:   Height as of 05/04/23: 6\' 3"  (1.905 m).   Weight as of 05/04/23: 104.3 kg.   Imaging Review Plain radiographs demonstrate severe degenerative joint disease of the left knee(s). The overall alignment isneutral. The bone quality appears to be adequate for age and reported activity level.      Assessment/Plan:  End stage arthritis, left knee   The patient history, physical examination, clinical judgment of the provider and imaging studies are consistent with end stage degenerative joint disease of the left knee(s) and total knee arthroplasty is deemed medically necessary. The treatment options including medical management, injection therapy arthroscopy and arthroplasty were discussed at length. The risks and benefits of total knee arthroplasty were presented and reviewed. The risks due to aseptic loosening, infection, stiffness, patella tracking problems, thromboembolic complications and other imponderables were discussed. The patient acknowledged the explanation, agreed to proceed with the plan and consent was signed. Patient is being admitted for inpatient treatment for surgery, pain control, PT, OT, prophylactic antibiotics, VTE prophylaxis, progressive ambulation and ADL's and discharge planning. The patient is planning to be discharged  home.     Patient's anticipated LOS is less than 2 midnights, meeting these requirements: - Younger than 69 - Lives within 1 hour of care - Has a competent adult at home to recover with post-op recover - NO history of  - Chronic pain requiring opiods  - Diabetes  - Coronary Artery Disease  - Heart failure  - Heart attack  - Stroke  - DVT/VTE  - Cardiac arrhythmia  - Respiratory Failure/COPD  - Renal failure  - Anemia  - Advanced Liver disease  Rosalene Billings, PA-C Orthopedic Surgery [facility] [place] [phone]

## 2023-05-09 NOTE — Anesthesia Preprocedure Evaluation (Signed)
Anesthesia Evaluation  Patient identified by MRN, date of birth, ID band Patient awake    Reviewed: Allergy & Precautions, H&P , NPO status , Patient's Chart, lab work & pertinent test results  Airway Mallampati: II  TM Distance: >3 FB Neck ROM: Full    Dental no notable dental hx. (+) Teeth Intact, Dental Advisory Given   Pulmonary neg pulmonary ROS, sleep apnea and Continuous Positive Airway Pressure Ventilation    Pulmonary exam normal breath sounds clear to auscultation       Cardiovascular Exercise Tolerance: Good hypertension, Pt. on medications and Pt. on home beta blockers + CAD, + Past MI and +CHF  + Cardiac Defibrillator  Rhythm:Regular Rate:Normal     Neuro/Psych negative neurological ROS  negative psych ROS   GI/Hepatic negative GI ROS, Neg liver ROS,,,  Endo/Other  diabetes    Renal/GU negative Renal ROS  negative genitourinary   Musculoskeletal  (+) Arthritis , Osteoarthritis,    Abdominal   Peds  Hematology negative hematology ROS (+)   Anesthesia Other Findings   Reproductive/Obstetrics negative OB ROS                             Anesthesia Physical Anesthesia Plan  ASA: 3  Anesthesia Plan: Spinal   Post-op Pain Management: Regional block* and Ofirmev IV (intra-op)*   Induction: Intravenous  PONV Risk Score and Plan: 2 and Ondansetron, Propofol infusion and Dexamethasone  Airway Management Planned: Natural Airway and Simple Face Mask  Additional Equipment:   Intra-op Plan:   Post-operative Plan:   Informed Consent: I have reviewed the patients History and Physical, chart, labs and discussed the procedure including the risks, benefits and alternatives for the proposed anesthesia with the patient or authorized representative who has indicated his/her understanding and acceptance.     Dental advisory given  Plan Discussed with: CRNA  Anesthesia Plan  Comments:        Anesthesia Quick Evaluation

## 2023-05-10 ENCOUNTER — Ambulatory Visit (HOSPITAL_COMMUNITY): Payer: No Typology Code available for payment source | Admitting: Anesthesiology

## 2023-05-10 ENCOUNTER — Other Ambulatory Visit: Payer: Self-pay

## 2023-05-10 ENCOUNTER — Observation Stay (HOSPITAL_COMMUNITY)
Admission: RE | Admit: 2023-05-10 | Discharge: 2023-05-11 | Disposition: A | Discharge status: Home / Self Care | Payer: No Typology Code available for payment source | Source: Ambulatory Visit | Attending: Orthopedic Surgery | Admitting: Orthopedic Surgery

## 2023-05-10 ENCOUNTER — Encounter (HOSPITAL_COMMUNITY): Payer: Self-pay | Admitting: Orthopedic Surgery

## 2023-05-10 ENCOUNTER — Encounter (HOSPITAL_COMMUNITY)
Admission: RE | Disposition: A | Discharge status: Home / Self Care | Payer: Self-pay | Source: Ambulatory Visit | Attending: Orthopedic Surgery

## 2023-05-10 DIAGNOSIS — Z95810 Presence of automatic (implantable) cardiac defibrillator: Secondary | ICD-10-CM | POA: Insufficient documentation

## 2023-05-10 DIAGNOSIS — I25119 Atherosclerotic heart disease of native coronary artery with unspecified angina pectoris: Secondary | ICD-10-CM

## 2023-05-10 DIAGNOSIS — I110 Hypertensive heart disease with heart failure: Secondary | ICD-10-CM | POA: Diagnosis not present

## 2023-05-10 DIAGNOSIS — E1122 Type 2 diabetes mellitus with diabetic chronic kidney disease: Secondary | ICD-10-CM | POA: Diagnosis not present

## 2023-05-10 DIAGNOSIS — Z7982 Long term (current) use of aspirin: Secondary | ICD-10-CM | POA: Insufficient documentation

## 2023-05-10 DIAGNOSIS — I130 Hypertensive heart and chronic kidney disease with heart failure and stage 1 through stage 4 chronic kidney disease, or unspecified chronic kidney disease: Secondary | ICD-10-CM | POA: Insufficient documentation

## 2023-05-10 DIAGNOSIS — Z96652 Presence of left artificial knee joint: Secondary | ICD-10-CM

## 2023-05-10 DIAGNOSIS — Z79899 Other long term (current) drug therapy: Secondary | ICD-10-CM | POA: Diagnosis not present

## 2023-05-10 DIAGNOSIS — I2510 Atherosclerotic heart disease of native coronary artery without angina pectoris: Secondary | ICD-10-CM | POA: Insufficient documentation

## 2023-05-10 DIAGNOSIS — N189 Chronic kidney disease, unspecified: Secondary | ICD-10-CM | POA: Insufficient documentation

## 2023-05-10 DIAGNOSIS — M1712 Unilateral primary osteoarthritis, left knee: Secondary | ICD-10-CM | POA: Diagnosis not present

## 2023-05-10 DIAGNOSIS — I5022 Chronic systolic (congestive) heart failure: Secondary | ICD-10-CM

## 2023-05-10 DIAGNOSIS — Z7984 Long term (current) use of oral hypoglycemic drugs: Secondary | ICD-10-CM | POA: Diagnosis not present

## 2023-05-10 DIAGNOSIS — Z96651 Presence of right artificial knee joint: Secondary | ICD-10-CM | POA: Insufficient documentation

## 2023-05-10 DIAGNOSIS — E119 Type 2 diabetes mellitus without complications: Secondary | ICD-10-CM

## 2023-05-10 HISTORY — PX: TOTAL KNEE ARTHROPLASTY: SHX125

## 2023-05-10 LAB — GLUCOSE, CAPILLARY
Glucose-Capillary: 119 mg/dL — ABNORMAL HIGH (ref 70–99)
Glucose-Capillary: 123 mg/dL — ABNORMAL HIGH (ref 70–99)
Glucose-Capillary: 169 mg/dL — ABNORMAL HIGH (ref 70–99)
Glucose-Capillary: 205 mg/dL — ABNORMAL HIGH (ref 70–99)
Glucose-Capillary: 265 mg/dL — ABNORMAL HIGH (ref 70–99)
Glucose-Capillary: 266 mg/dL — ABNORMAL HIGH (ref 70–99)

## 2023-05-10 SURGERY — ARTHROPLASTY, KNEE, TOTAL
Anesthesia: Spinal | Site: Knee | Laterality: Left

## 2023-05-10 MED ORDER — ONDANSETRON HCL 4 MG/2ML IJ SOLN
INTRAMUSCULAR | Status: AC
  Filled 2023-05-10: qty 2

## 2023-05-10 MED ORDER — CEFAZOLIN SODIUM-DEXTROSE 2-4 GM/100ML-% IV SOLN
2.0000 g | INTRAVENOUS | Status: AC
  Administered 2023-05-10: 2 g via INTRAVENOUS
  Filled 2023-05-10: qty 100

## 2023-05-10 MED ORDER — DEXAMETHASONE SODIUM PHOSPHATE 10 MG/ML IJ SOLN
INTRAMUSCULAR | Status: AC
  Filled 2023-05-10: qty 1

## 2023-05-10 MED ORDER — BUPIVACAINE IN DEXTROSE 0.75-8.25 % IT SOLN
INTRATHECAL | Status: DC | PRN
  Administered 2023-05-10: 1.8 mL via INTRATHECAL

## 2023-05-10 MED ORDER — ONDANSETRON HCL 4 MG/2ML IJ SOLN: 4.0000 mg | Freq: Four times a day (QID) | INTRAMUSCULAR | Status: DC | PRN

## 2023-05-10 MED ORDER — CHLORHEXIDINE GLUCONATE 0.12 % MT SOLN
15.0000 mL | Freq: Once | OROMUCOSAL | Status: AC
  Administered 2023-05-10: 15 mL via OROMUCOSAL

## 2023-05-10 MED ORDER — METFORMIN HCL ER 500 MG PO TB24
1000.0000 mg | ORAL_TABLET | Freq: Two times a day (BID) | ORAL | Status: DC
  Administered 2023-05-11: 1000 mg via ORAL
  Filled 2023-05-10: qty 2

## 2023-05-10 MED ORDER — METHOCARBAMOL 500 MG IVPB - SIMPLE MED: 500.0000 mg | Freq: Four times a day (QID) | INTRAVENOUS | Status: DC | PRN

## 2023-05-10 MED ORDER — METOCLOPRAMIDE HCL 5 MG PO TABS: 5.0000 mg | ORAL_TABLET | Freq: Three times a day (TID) | ORAL | Status: DC | PRN

## 2023-05-10 MED ORDER — HYDROXYZINE HCL 10 MG PO TABS: 10.0000 mg | ORAL_TABLET | Freq: Three times a day (TID) | ORAL | Status: DC | PRN

## 2023-05-10 MED ORDER — HYDROMORPHONE HCL 1 MG/ML IJ SOLN: 0.5000 mg | INTRAMUSCULAR | Status: DC | PRN

## 2023-05-10 MED ORDER — SODIUM CHLORIDE (PF) 0.9 % IJ SOLN
INTRAMUSCULAR | Status: DC | PRN
  Administered 2023-05-10: 30 mL

## 2023-05-10 MED ORDER — OXYCODONE HCL 5 MG PO TABS
5.0000 mg | ORAL_TABLET | ORAL | Status: DC | PRN
  Administered 2023-05-10: 5 mg via ORAL
  Administered 2023-05-11: 10 mg via ORAL

## 2023-05-10 MED ORDER — BISACODYL 10 MG RE SUPP: 10.0000 mg | Freq: Every day | RECTAL | Status: DC | PRN

## 2023-05-10 MED ORDER — SPIRONOLACTONE 12.5 MG HALF TABLET
12.5000 mg | ORAL_TABLET | Freq: Every day | ORAL | Status: DC
  Administered 2023-05-11: 12.5 mg via ORAL
  Filled 2023-05-10: qty 1

## 2023-05-10 MED ORDER — METHOCARBAMOL 500 MG PO TABS
ORAL_TABLET | ORAL | Status: AC
  Filled 2023-05-10: qty 1

## 2023-05-10 MED ORDER — FENTANYL CITRATE (PF) 100 MCG/2ML IJ SOLN
INTRAMUSCULAR | Status: DC | PRN
  Administered 2023-05-10: 50 ug via INTRAVENOUS

## 2023-05-10 MED ORDER — PROPOFOL 500 MG/50ML IV EMUL
INTRAVENOUS | Status: DC | PRN
  Administered 2023-05-10: 40 ug/kg/min via INTRAVENOUS

## 2023-05-10 MED ORDER — PHENYLEPHRINE HCL-NACL 20-0.9 MG/250ML-% IV SOLN
INTRAVENOUS | Status: DC | PRN
  Administered 2023-05-10: 20 ug/min via INTRAVENOUS

## 2023-05-10 MED ORDER — BUPIVACAINE-EPINEPHRINE 0.25% -1:200000 IJ SOLN
INTRAMUSCULAR | Status: AC
  Filled 2023-05-10: qty 1

## 2023-05-10 MED ORDER — PROPOFOL 1000 MG/100ML IV EMUL
INTRAVENOUS | Status: AC
  Filled 2023-05-10: qty 100

## 2023-05-10 MED ORDER — MIDAZOLAM HCL 5 MG/5ML IJ SOLN
INTRAMUSCULAR | Status: DC | PRN
  Administered 2023-05-10: 2 mg via INTRAVENOUS

## 2023-05-10 MED ORDER — DOCUSATE SODIUM 100 MG PO CAPS
100.0000 mg | ORAL_CAPSULE | Freq: Two times a day (BID) | ORAL | Status: DC
  Administered 2023-05-10 – 2023-05-11 (×2): 100 mg via ORAL
  Filled 2023-05-10 (×2): qty 1

## 2023-05-10 MED ORDER — CARVEDILOL 12.5 MG PO TABS
12.5000 mg | ORAL_TABLET | Freq: Two times a day (BID) | ORAL | Status: DC
  Administered 2023-05-10 – 2023-05-11 (×2): 12.5 mg via ORAL
  Filled 2023-05-10 (×2): qty 1

## 2023-05-10 MED ORDER — DEXAMETHASONE SODIUM PHOSPHATE 10 MG/ML IJ SOLN
8.0000 mg | Freq: Once | INTRAMUSCULAR | Status: AC
  Administered 2023-05-10: 8 mg via INTRAVENOUS

## 2023-05-10 MED ORDER — EPHEDRINE SULFATE-NACL 50-0.9 MG/10ML-% IV SOSY
PREFILLED_SYRINGE | INTRAVENOUS | Status: DC | PRN
  Administered 2023-05-10: 5 mg via INTRAVENOUS

## 2023-05-10 MED ORDER — ACETAMINOPHEN 325 MG PO TABS: 325.0000 mg | ORAL_TABLET | Freq: Four times a day (QID) | ORAL | Status: DC | PRN

## 2023-05-10 MED ORDER — POVIDONE-IODINE 10 % EX SWAB: 2.0000 | Freq: Once | CUTANEOUS | Status: DC

## 2023-05-10 MED ORDER — OXYCODONE HCL 5 MG PO TABS
10.0000 mg | ORAL_TABLET | ORAL | Status: DC | PRN
  Administered 2023-05-10 (×2): 15 mg via ORAL
  Filled 2023-05-10 (×4): qty 3

## 2023-05-10 MED ORDER — KETOROLAC TROMETHAMINE 30 MG/ML IJ SOLN
INTRAMUSCULAR | Status: AC
  Filled 2023-05-10: qty 1

## 2023-05-10 MED ORDER — ASPIRIN 81 MG PO CHEW
81.0000 mg | CHEWABLE_TABLET | Freq: Two times a day (BID) | ORAL | Status: DC
  Administered 2023-05-10 – 2023-05-11 (×2): 81 mg via ORAL
  Filled 2023-05-10 (×2): qty 1

## 2023-05-10 MED ORDER — ACETAMINOPHEN 500 MG PO TABS
1000.0000 mg | ORAL_TABLET | Freq: Four times a day (QID) | ORAL | Status: AC
  Administered 2023-05-10 – 2023-05-11 (×4): 1000 mg via ORAL
  Filled 2023-05-10 (×4): qty 2

## 2023-05-10 MED ORDER — CEFAZOLIN SODIUM-DEXTROSE 2-4 GM/100ML-% IV SOLN
2.0000 g | Freq: Four times a day (QID) | INTRAVENOUS | Status: AC
  Administered 2023-05-10 (×2): 2 g via INTRAVENOUS
  Filled 2023-05-10 (×2): qty 100

## 2023-05-10 MED ORDER — ONDANSETRON HCL 4 MG/2ML IJ SOLN
INTRAMUSCULAR | Status: DC | PRN
  Administered 2023-05-10: 4 mg via INTRAVENOUS

## 2023-05-10 MED ORDER — DIPHENHYDRAMINE HCL 12.5 MG/5ML PO ELIX: 12.5000 mg | ORAL_SOLUTION | ORAL | Status: DC | PRN

## 2023-05-10 MED ORDER — DEXAMETHASONE SODIUM PHOSPHATE 10 MG/ML IJ SOLN
10.0000 mg | Freq: Once | INTRAMUSCULAR | Status: AC
  Administered 2023-05-11: 10 mg via INTRAVENOUS
  Filled 2023-05-10: qty 1

## 2023-05-10 MED ORDER — GLIMEPIRIDE 4 MG PO TABS
4.0000 mg | ORAL_TABLET | Freq: Every day | ORAL | Status: DC
  Administered 2023-05-11: 4 mg via ORAL
  Filled 2023-05-10: qty 1

## 2023-05-10 MED ORDER — STERILE WATER FOR IRRIGATION IR SOLN
Status: DC | PRN
  Administered 2023-05-10: 2000 mL

## 2023-05-10 MED ORDER — PHENOL 1.4 % MT LIQD: 1.0000 | OROMUCOSAL | Status: DC | PRN

## 2023-05-10 MED ORDER — ACETAMINOPHEN 10 MG/ML IV SOLN
INTRAVENOUS | Status: AC
  Filled 2023-05-10: qty 100

## 2023-05-10 MED ORDER — METOCLOPRAMIDE HCL 5 MG/ML IJ SOLN: 5.0000 mg | Freq: Three times a day (TID) | INTRAMUSCULAR | Status: DC | PRN

## 2023-05-10 MED ORDER — ONDANSETRON HCL 4 MG PO TABS: 4.0000 mg | ORAL_TABLET | Freq: Four times a day (QID) | ORAL | Status: DC | PRN

## 2023-05-10 MED ORDER — BUPIVACAINE-EPINEPHRINE (PF) 0.25% -1:200000 IJ SOLN
INTRAMUSCULAR | Status: DC | PRN
  Administered 2023-05-10: 30 mL

## 2023-05-10 MED ORDER — MIDAZOLAM HCL 2 MG/2ML IJ SOLN
INTRAMUSCULAR | Status: AC
  Filled 2023-05-10: qty 2

## 2023-05-10 MED ORDER — 0.9 % SODIUM CHLORIDE (POUR BTL) OPTIME
TOPICAL | Status: DC | PRN
  Administered 2023-05-10: 1000 mL

## 2023-05-10 MED ORDER — METHOCARBAMOL 500 MG PO TABS
500.0000 mg | ORAL_TABLET | Freq: Four times a day (QID) | ORAL | Status: DC | PRN
  Administered 2023-05-10: 500 mg via ORAL

## 2023-05-10 MED ORDER — HYDROMORPHONE HCL 1 MG/ML IJ SOLN: 0.2500 mg | INTRAMUSCULAR | Status: DC | PRN

## 2023-05-10 MED ORDER — ORAL CARE MOUTH RINSE: 15.0000 mL | Freq: Once | OROMUCOSAL | Status: AC

## 2023-05-10 MED ORDER — NITROGLYCERIN 0.4 MG SL SUBL: 0.4000 mg | SUBLINGUAL_TABLET | SUBLINGUAL | Status: DC | PRN

## 2023-05-10 MED ORDER — SODIUM CHLORIDE 0.9 % IR SOLN
Status: DC | PRN
  Administered 2023-05-10: 1000 mL

## 2023-05-10 MED ORDER — INSULIN ASPART 100 UNIT/ML IJ SOLN
0.0000 [IU] | Freq: Three times a day (TID) | INTRAMUSCULAR | Status: DC
  Administered 2023-05-10: 8 [IU] via SUBCUTANEOUS
  Administered 2023-05-11: 3 [IU] via SUBCUTANEOUS

## 2023-05-10 MED ORDER — LACTATED RINGERS IV SOLN: INTRAVENOUS | Status: DC

## 2023-05-10 MED ORDER — MAGNESIUM OXIDE -MG SUPPLEMENT 400 (240 MG) MG PO TABS
400.0000 mg | ORAL_TABLET | Freq: Every day | ORAL | Status: DC
  Administered 2023-05-11: 400 mg via ORAL
  Filled 2023-05-10: qty 1

## 2023-05-10 MED ORDER — BUPIVACAINE-EPINEPHRINE (PF) 0.5% -1:200000 IJ SOLN
INTRAMUSCULAR | Status: DC | PRN
  Administered 2023-05-10: 20 mL via PERINEURAL

## 2023-05-10 MED ORDER — KETOROLAC TROMETHAMINE 30 MG/ML IJ SOLN
INTRAMUSCULAR | Status: DC | PRN
  Administered 2023-05-10: 30 mg

## 2023-05-10 MED ORDER — TRANEXAMIC ACID-NACL 1000-0.7 MG/100ML-% IV SOLN
1000.0000 mg | INTRAVENOUS | Status: AC
  Administered 2023-05-10: 1000 mg via INTRAVENOUS
  Filled 2023-05-10: qty 100

## 2023-05-10 MED ORDER — SODIUM CHLORIDE (PF) 0.9 % IJ SOLN
INTRAMUSCULAR | Status: AC
  Filled 2023-05-10: qty 30

## 2023-05-10 MED ORDER — ACETAMINOPHEN 10 MG/ML IV SOLN
INTRAVENOUS | Status: DC | PRN
  Administered 2023-05-10: 1000 mg via INTRAVENOUS

## 2023-05-10 MED ORDER — TRANEXAMIC ACID-NACL 1000-0.7 MG/100ML-% IV SOLN
1000.0000 mg | Freq: Once | INTRAVENOUS | Status: AC
  Administered 2023-05-10: 1000 mg via INTRAVENOUS
  Filled 2023-05-10: qty 100

## 2023-05-10 MED ORDER — POLYETHYLENE GLYCOL 3350 17 G PO PACK
17.0000 g | PACK | Freq: Two times a day (BID) | ORAL | Status: DC
  Administered 2023-05-10 – 2023-05-11 (×2): 17 g via ORAL
  Filled 2023-05-10 (×2): qty 1

## 2023-05-10 MED ORDER — PRAMIPEXOLE DIHYDROCHLORIDE 0.25 MG PO TABS
1.0000 mg | ORAL_TABLET | Freq: Every day | ORAL | Status: DC
  Administered 2023-05-10: 1 mg via ORAL
  Filled 2023-05-10: qty 4

## 2023-05-10 MED ORDER — MENTHOL 3 MG MT LOZG: 1.0000 | LOZENGE | OROMUCOSAL | Status: DC | PRN

## 2023-05-10 MED ORDER — INSULIN ASPART 100 UNIT/ML IJ SOLN: 0.0000 [IU] | INTRAMUSCULAR | Status: DC | PRN

## 2023-05-10 MED ORDER — SODIUM CHLORIDE 0.9 % IV SOLN: INTRAVENOUS | Status: DC

## 2023-05-10 MED ORDER — FENTANYL CITRATE (PF) 100 MCG/2ML IJ SOLN
INTRAMUSCULAR | Status: AC
  Filled 2023-05-10: qty 2

## 2023-05-10 MED ORDER — ATORVASTATIN CALCIUM 40 MG PO TABS
80.0000 mg | ORAL_TABLET | Freq: Every day | ORAL | Status: DC
  Administered 2023-05-11: 80 mg via ORAL
  Filled 2023-05-10: qty 2

## 2023-05-10 MED ORDER — SACUBITRIL-VALSARTAN 24-26 MG PO TABS
1.0000 | ORAL_TABLET | Freq: Two times a day (BID) | ORAL | Status: DC
  Administered 2023-05-11: 1 via ORAL
  Filled 2023-05-10: qty 1

## 2023-05-10 MED ORDER — OXYCODONE HCL 5 MG PO TABS
ORAL_TABLET | ORAL | Status: AC
  Filled 2023-05-10: qty 1

## 2023-05-10 SURGICAL SUPPLY — 58 items
ADH SKN CLS APL DERMABOND .7 (GAUZE/BANDAGES/DRESSINGS) ×1
ATTUNE MED ANAT PAT 41 KNEE (Knees) IMPLANT
BAG COUNTER SPONGE SURGICOUNT (BAG) IMPLANT
BAG SPEC THK2 15X12 ZIP CLS (MISCELLANEOUS)
BAG SPNG CNTER NS LX DISP (BAG)
BAG ZIPLOCK 12X15 (MISCELLANEOUS) IMPLANT
BASEPLATE TIB CMT FB PCKT SZ 8 (Knees) IMPLANT
BLADE SAW SGTL 11.0X1.19X90.0M (BLADE) IMPLANT
BLADE SAW SGTL 13.0X1.19X90.0M (BLADE) ×2 IMPLANT
BNDG CMPR 6 X 5 YARDS HK CLSR (GAUZE/BANDAGES/DRESSINGS) ×1
BNDG CMPR MED 10X6 ELC LF (GAUZE/BANDAGES/DRESSINGS) ×1
BNDG ELASTIC 6INX 5YD STR LF (GAUZE/BANDAGES/DRESSINGS) ×2 IMPLANT
BNDG ELASTIC 6X10 VLCR STRL LF (GAUZE/BANDAGES/DRESSINGS) IMPLANT
BOWL SMART MIX CTS (DISPOSABLE) ×2 IMPLANT
BSPLAT TIB 8 CMNT FXBRNG STRL (Knees) ×1 IMPLANT
CEMENT HV SMART SET (Cement) IMPLANT
COMP FEM CMT ATTUNE KNEE 7 LT (Joint) ×1 IMPLANT
COMPONENT FEM CMT ATTN KN 7 LT (Joint) IMPLANT
COVER SURGICAL LIGHT HANDLE (MISCELLANEOUS) ×2 IMPLANT
CUFF TOURN SGL QUICK 34 (TOURNIQUET CUFF) ×1
CUFF TRNQT CYL 34X4.125X (TOURNIQUET CUFF) ×2 IMPLANT
DERMABOND ADVANCED .7 DNX12 (GAUZE/BANDAGES/DRESSINGS) ×2 IMPLANT
DRAPE U-SHAPE 47X51 STRL (DRAPES) ×2 IMPLANT
DRESSING AQUACEL AG SP 3.5X10 (GAUZE/BANDAGES/DRESSINGS) ×2 IMPLANT
DRSG AQUACEL AG ADV 3.5X10 (GAUZE/BANDAGES/DRESSINGS) IMPLANT
DRSG AQUACEL AG SP 3.5X10 (GAUZE/BANDAGES/DRESSINGS) ×1
DURAPREP 26ML APPLICATOR (WOUND CARE) ×4 IMPLANT
ELECT REM PT RETURN 15FT ADLT (MISCELLANEOUS) ×2 IMPLANT
GLOVE BIO SURGEON STRL SZ 6 (GLOVE) ×2 IMPLANT
GLOVE BIOGEL PI IND STRL 6.5 (GLOVE) ×2 IMPLANT
GLOVE BIOGEL PI IND STRL 7.5 (GLOVE) ×2 IMPLANT
GLOVE ORTHO TXT STRL SZ7.5 (GLOVE) ×4 IMPLANT
GOWN STRL REUS W/ TWL LRG LVL3 (GOWN DISPOSABLE) ×4 IMPLANT
GOWN STRL REUS W/TWL LRG LVL3 (GOWN DISPOSABLE) ×2
HANDPIECE INTERPULSE COAX TIP (DISPOSABLE) ×1
HOLDER FOLEY CATH W/STRAP (MISCELLANEOUS) IMPLANT
INSERT TIB CMT ATTUNE 7 7 LT (Insert) IMPLANT
KIT TURNOVER KIT A (KITS) IMPLANT
MANIFOLD NEPTUNE II (INSTRUMENTS) ×2 IMPLANT
NDL SAFETY ECLIP 18X1.5 (MISCELLANEOUS) IMPLANT
NS IRRIG 1000ML POUR BTL (IV SOLUTION) ×2 IMPLANT
PACK TOTAL KNEE CUSTOM (KITS) ×2 IMPLANT
PIN FIX SIGMA LCS THRD HI (PIN) IMPLANT
PROTECTOR NERVE ULNAR (MISCELLANEOUS) ×2 IMPLANT
SET HNDPC FAN SPRY TIP SCT (DISPOSABLE) ×2 IMPLANT
SET PAD KNEE POSITIONER (MISCELLANEOUS) ×2 IMPLANT
SPIKE FLUID TRANSFER (MISCELLANEOUS) ×4 IMPLANT
SUT MNCRL AB 4-0 PS2 18 (SUTURE) ×2 IMPLANT
SUT STRATAFIX PDS+ 0 24IN (SUTURE) ×2 IMPLANT
SUT VIC AB 1 CT1 36 (SUTURE) ×2 IMPLANT
SUT VIC AB 2-0 CT1 27 (SUTURE) ×2
SUT VIC AB 2-0 CT1 TAPERPNT 27 (SUTURE) ×4 IMPLANT
SYR 3ML LL SCALE MARK (SYRINGE) ×2 IMPLANT
TOWEL GREEN STERILE FF (TOWEL DISPOSABLE) ×2 IMPLANT
TRAY FOLEY MTR SLVR 16FR STAT (SET/KITS/TRAYS/PACK) ×2 IMPLANT
TUBE SUCTION HIGH CAP CLEAR NV (SUCTIONS) ×2 IMPLANT
WATER STERILE IRR 1000ML POUR (IV SOLUTION) ×4 IMPLANT
WRAP KNEE MAXI GEL POST OP (GAUZE/BANDAGES/DRESSINGS) ×2 IMPLANT

## 2023-05-10 NOTE — Anesthesia Procedure Notes (Signed)
Anesthesia Regional Block: Adductor canal block   Pre-Anesthetic Checklist: , timeout performed,  Correct Patient, Correct Site, Correct Laterality,  Correct Procedure, Correct Position, site marked,  Risks and benefits discussed,  Pre-op evaluation,  At surgeon's request and post-op pain management  Laterality: Left  Prep: Maximum Sterile Barrier Precautions used, chloraprep       Needles:  Injection technique: Single-shot  Needle Type: Echogenic Stimulator Needle     Needle Length: 9cm  Needle Gauge: 21     Additional Needles:   Procedures:,,,, ultrasound used (permanent image in chart),,    Narrative:  Start time: 05/10/2023 6:45 AM End time: 05/10/2023 6:55 AM Injection made incrementally with aspirations every 5 mL.  Performed by: Personally  Anesthesiologist: Gaynelle Adu, MD

## 2023-05-10 NOTE — Op Note (Signed)
NAME:  Keith Schwartz                      MEDICAL RECORD NO.:  [id number]                             FACILITY:  [facility]      PHYSICIAN:  Madlyn Frankel. Charlann Boxer, M.D.  DATE OF BIRTH:  August 15, 1951      DATE OF PROCEDURE:  05/10/2023                                     OPERATIVE REPORT         PREOPERATIVE DIAGNOSIS:  Left knee osteoarthritis.      POSTOPERATIVE DIAGNOSIS:  Left knee osteoarthritis.      FINDINGS:  The patient was noted to have complete loss of cartilage and   bone-on-bone arthritis with associated osteophytes in the medial and patellofemoral compartments of   the knee with a significant synovitis and associated effusion.  The patient had failed months of conservative treatment including medications, injection therapy, activity modification.     PROCEDURE:  Left total knee replacement.      COMPONENTS USED:  DePuy Attune FB CR MS knee   system, a size 7 femur, 8 tibia, size 7 mm CR MS AOX insert, and 41 anatomic patellar   button.      SURGEON:  Madlyn Frankel. Charlann Boxer, M.D.      ASSISTANT:  Rosalene Billings, PA-C.      ANESTHESIA:  Regional and Spinal.      SPECIMENS:  None.      COMPLICATION:  None.      DRAINS:  None.  EBL: <200 cc      TOURNIQUET TIME:  31 min @ 225 mmHg     The patient was stable to the recovery room.      INDICATION FOR PROCEDURE:  Keith Schwartz is a 72 y.o. male patient of   mine.  The patient had been seen, evaluated, and treated for months conservatively in the   office with medication, activity modification, and injections.  The patient had   radiographic changes of bone-on-bone arthritis with endplate sclerosis and osteophytes noted.  Based on the radiographic changes and failed conservative measures, the patient   decided to proceed with definitive treatment, total knee replacement.  Risks of infection, DVT, component failure, need for revision surgery, neurovascular injury were reviewed in the office setting.  The postop course was  reviewed stressing the efforts to maximize post-operative satisfaction and function.  Consent was obtained for benefit of pain   relief.      PROCEDURE IN DETAIL:  The patient was brought to the operative theater.   Once adequate anesthesia, preoperative antibiotics, 2 gm of Ancef,1 gm of Tranexamic Acid, and 10 mg of Decadron administered, the patient was positioned supine with a left thigh tourniquet placed.  The  left lower extremity was prepped and draped in sterile fashion.  A time-   out was performed identifying the patient, planned procedure, and the appropriate extremity.      The left lower extremity was placed in the [facility] leg holder.  The leg was   exsanguinated, tourniquet elevated to 225 mmHg.  A midline incision was   made followed by median parapatellar arthrotomy.  Following initial   exposure, attention was  first directed to the patella.  Precut   measurement was noted to be 26 mm.  I resected down to 14 mm and used a   41 anatomic patellar button to restore patellar height as well as cover the cut surface.      The lug holes were drilled and a metal shim was placed to protect the   patella from retractors and saw blade during the procedure.      At this point, attention was now directed to the femur.  The femoral   canal was opened with a drill, irrigated to try to prevent fat emboli.  An   intramedullary rod was passed at 5 degrees valgus, 9 mm of bone was   resected off the distal femur.  Following this resection, the tibia was   subluxated anteriorly.  Using the extramedullary guide, 2 mm of bone was resected off   the proximal medial tibia.  We confirmed the gap would be   stable medially and laterally with a size 5 spacer block as well as confirmed that the tibial cut was perpendicular in the coronal plane, checking with an alignment rod.      Once this was done, I sized the femur to be a size 7 in the anterior-   posterior dimension, chose a standard component  based on medial and   lateral dimension.  The size 7 rotation block was then pinned in   position anterior referenced using the C-clamp to set rotation.  The   anterior, posterior, and  chamfer cuts were made without difficulty nor   notching making certain that I was along the anterior cortex to help   with flexion gap stability.      The final shim cut was made off the lateral aspect of distal femur.      At this point, the tibia was sized to be a size 8.  The size 8 tray was   then pinned in position through the medial third of the tubercle,   drilled, and keel punched.  Trial reduction was now carried with a 7 femur,  8 tibia, a size 7 mm CR MS insert, and the 41 anatomic patella botton.  The knee was brought to full extension with good flexion stability with the patella   tracking through the trochlea without application of pressure.  Given   all these findings the trial components removed.  Final components were   opened and cement was mixed.  The knee was irrigated with normal saline solution and pulse lavage.  The synovial lining was   then injected with 30 cc of 0.25% Marcaine with epinephrine, 1 cc of Toradol and 30 cc of NS for a total of 61 cc.     Final implants were then cemented onto cleaned and dried cut surfaces of bone with the knee brought to extension with a size 7 mm CR MS trial insert.      Once the cement had fully cured, excess cement was removed   throughout the knee.  I confirmed that I was satisfied with the range of   motion and stability, and the final size 7 mm CR MS AOX insert was chosen.  It was   placed into the knee.      The tourniquet had been let down at 31 minutes.  No significant   hemostasis was required.  The extensor mechanism was then reapproximated using #1 Vicryl and #1 Stratafix sutures with the knee   in flexion.  The   remaining wound was closed with 2-0 Vicryl and running 4-0 Monocryl.   The knee was cleaned, dried, dressed sterilely using  Dermabond and   Aquacel dressing.  The patient was then   brought to recovery room in stable condition, tolerating the procedure   well.   Please note that Physician Assistant, Rosalene Billings, PA-C was present for the entirety of the case, and was utilized for pre-operative positioning, peri-operative retractor management, general facilitation of the procedure and for primary wound closure at the end of the case.              Madlyn Frankel Charlann Boxer, M.D.    05/10/2023 7:22 AM

## 2023-05-10 NOTE — Transfer of Care (Signed)
Immediate Anesthesia Transfer of Care Note  Patient: Keith Schwartz  Procedure(s) Performed: TOTAL KNEE ARTHROPLASTY (Left: Knee)  Patient Location: PACU  Anesthesia Type:MAC and Spinal  Level of Consciousness: awake, alert , oriented, and patient cooperative  Airway & Oxygen Therapy: Patient Spontanous Breathing and Patient connected to face mask oxygen  Post-op Assessment: Report given to RN and Post -op Vital signs reviewed and stable  Post vital signs: Reviewed and stable  Last Vitals:  Vitals Value Taken Time  BP 108/62 05/10/23 0903  Temp    Pulse 72 05/10/23 0906  Resp 8 05/10/23 0906  SpO2 100 % 05/10/23 0906  Vitals shown include unfiled device data.  Last Pain:  Vitals:   05/10/23 0557  TempSrc: Oral  PainSc:          Complications: No notable events documented.

## 2023-05-10 NOTE — Anesthesia Procedure Notes (Signed)
Procedure Name: MAC Date/Time: 05/10/2023 7:19 AM  Performed by: Elisabeth Cara, CRNAPre-anesthesia Checklist: Patient identified, Emergency Drugs available, Patient being monitored, Timeout performed and Suction available Patient Re-evaluated:Patient Re-evaluated prior to induction Oxygen Delivery Method: Simple face mask Placement Confirmation: positive ETCO2 Dental Injury: Teeth and Oropharynx as per pre-operative assessment

## 2023-05-10 NOTE — Interval H&P Note (Signed)
History and Physical Interval Note:  05/10/2023 7:01 AM  Keith Schwartz  has presented today for surgery, with the diagnosis of Left knee osteoarthritis.  The various methods of treatment have been discussed with the patient and family. After consideration of risks, benefits and other options for treatment, the patient has consented to  Procedure(s): TOTAL KNEE ARTHROPLASTY (Left) as a surgical intervention.  The patient's history has been reviewed, patient examined, no change in status, stable for surgery.  I have reviewed the patient's chart and labs.  Questions were answered to the patient's satisfaction.     Shelda Pal

## 2023-05-10 NOTE — Discharge Instructions (Signed)

## 2023-05-10 NOTE — Evaluation (Signed)
Physical Therapy Evaluation Patient Details Name: Keith Schwartz MRN: [id number] DOB: 1950-11-15 Today's Date: 05/10/2023  History of Present Illness  72 yo male presents to therapy s/p L TKA on 05/10/2023 due to failure of conservative measures. Pt PMH includes but is not limited to: R TKA revision (2023), AICD in situ, sCHF, angina, CAD, gout, HLD, DM II, HTN, MI, and  OSA on CPAP.  Clinical Impression      Keith Schwartz is a 72 y.o. male POD 0 s/p L TKA. Patient reports IND with mobility at baseline. Patient is now limited by functional impairments (see PT problem list below) and requires S for bed mobility and min guard and cues for transfers. Patient was able to ambulate 60 feet with RW and min guard level of assist. Patient instructed in exercise to facilitate ROM and circulation to manage edema. Patient will benefit from continued skilled PT interventions to address impairments and progress towards PLOF. Acute PT will follow to progress mobility and stair training in preparation for safe discharge home with family support and OPPT services.     Assistance Recommended at Discharge Intermittent Supervision/Assistance  If plan is discharge home, recommend the following:  Can travel by private vehicle  A little help with walking and/or transfers;A little help with bathing/dressing/bathroom;Assistance with cooking/housework;Assist for transportation        Equipment Recommendations None recommended by PT (DME in home setting)  Recommendations for Other Services       Functional Status Assessment Patient has had a recent decline in their functional status and demonstrates the ability to make significant improvements in function in a reasonable and predictable amount of time.     Precautions / Restrictions Precautions Precautions: Knee;Fall Restrictions Weight Bearing Restrictions: No      Mobility  Bed Mobility Overal bed mobility: Needs Assistance Bed Mobility:  Sidelying to Sit   Sidelying to sit: Supervision       General bed mobility comments: min cues and HOB elevated    Transfers Overall transfer level: Needs assistance Equipment used: Rolling walker (2 wheels) Transfers: Sit to/from Stand Sit to Stand: Min guard           General transfer comment: cues for proper UE placement, L hand painful secondary to IV placement and unable to push to stand    Ambulation/Gait Ambulation/Gait assistance: Min guard Gait Distance (Feet): 60 Feet Assistive device: Rolling walker (2 wheels) Gait Pattern/deviations: Step-to pattern, Antalgic Gait velocity: decreased     General Gait Details: cues for safety and proper distance from RW, pt reported episode of dizziness with gait  Stairs            Wheelchair Mobility     Tilt Bed    Modified Rankin (Stroke Patients Only)       Balance Overall balance assessment: Needs assistance Sitting-balance support: Feet supported Sitting balance-Leahy Scale: Good     Standing balance support: Bilateral upper extremity supported, During functional activity, Reliant on assistive device for balance Standing balance-Leahy Scale: Poor                               Pertinent Vitals/Pain Pain Assessment Pain Assessment: 0-10 Pain Score: 7  Pain Location: L knee Pain Descriptors / Indicators: Aching, Constant, Discomfort, Operative site guarding Pain Intervention(s): Limited activity within patient's tolerance, Monitored during session, Premedicated before session, Repositioned, RN gave pain meds during session, Ice applied    Home Living  Family/patient expects to be discharged to:: Private residence Living Arrangements: Spouse/significant other Available Help at Discharge: Family Type of Home: House Home Access: Ramped entrance       Home Layout: One level Home Equipment: Rolling Walker (2 wheels);Grab bars - tub/shower;Grab bars - toilet;Shower seat - built in       Prior Function Prior Level of Function : Independent/Modified Independent             Mobility Comments: IND NO AD all ADLs, self care tasks ADLs Comments: spouse assist with driving and IADLs.     Hand Dominance        Extremity/Trunk Assessment        Lower Extremity Assessment Lower Extremity Assessment: LLE deficits/detail LLE Deficits / Details: ankle DF/PF 5/5 ; SLR <10 degree lag LLE Sensation: WNL    Cervical / Trunk Assessment Cervical / Trunk Assessment:  (wfl)  Communication   Communication: No difficulties  Cognition Arousal/Alertness: Awake/alert Behavior During Therapy: WFL for tasks assessed/performed Overall Cognitive Status: Within Functional Limits for tasks assessed                                          General Comments      Exercises Total Joint Exercises Ankle Circles/Pumps: AROM, Both, 20 reps Quad Sets: AROM, Left, 5 reps Short Arc Quad: AROM, Left, 5 reps Straight Leg Raises: AROM, 5 reps, Left   Assessment/Plan    PT Assessment Patient needs continued PT services  PT Problem List Decreased strength;Decreased range of motion;Decreased activity tolerance;Decreased balance;Decreased mobility;Decreased coordination;Pain       PT Treatment Interventions DME instruction;Gait training;Functional mobility training;Therapeutic activities;Therapeutic exercise;Balance training;Neuromuscular re-education;Patient/family education;Modalities    PT Goals (Current goals can be found in the Care Plan section)  Acute Rehab PT Goals Patient Stated Goal: to be able to go out and cut trees in the back of the house PT Goal Formulation: With patient Time For Goal Achievement: 05/30/23 Potential to Achieve Goals: Good    Frequency 7X/week     Co-evaluation               AM-PAC PT "6 Clicks" Mobility  Outcome Measure Help needed turning from your back to your side while in a flat bed without using bedrails?: None Help  needed moving from lying on your back to sitting on the side of a flat bed without using bedrails?: A Little Help needed moving to and from a bed to a chair (including a wheelchair)?: A Little Help needed standing up from a chair using your arms (e.g., wheelchair or bedside chair)?: A Little Help needed to walk in hospital room?: A Little Help needed climbing 3-5 steps with a railing? : A Lot 6 Click Score: 18    End of Session Equipment Utilized During Treatment: Gait belt Activity Tolerance: Patient tolerated treatment well Patient left: in chair;with call bell/phone within reach;with family/visitor present Nurse Communication: Mobility status PT Visit Diagnosis: Unsteadiness on feet (R26.81);Other abnormalities of gait and mobility (R26.89);Muscle weakness (generalized) (M62.81);Difficulty in walking, not elsewhere classified (R26.2);Pain Pain - Right/Left: Left Pain - part of body: Knee;Leg    Time: 0865-7846 PT Time Calculation (min) (ACUTE ONLY): 40 min   Charges:   PT Evaluation $PT Eval Low Complexity: 1 Low PT Treatments $Gait Training: 8-22 mins $Therapeutic Exercise: 8-22 mins PT General Charges $$ ACUTE PT VISIT: 1 Visit  Johnny Bridge, PT Acute Rehab   Jacqualyn Posey 05/10/2023, 4:06 PM

## 2023-05-10 NOTE — Anesthesia Procedure Notes (Signed)
Spinal  Start time: 05/10/2023 7:22 AM End time: 05/10/2023 7:26 AM Reason for block: surgical anesthesia Staffing Performed: resident/CRNA  Resident/CRNA: Elisabeth Cara, CRNA Performed by: Elisabeth Cara, CRNA Authorized by: Gaynelle Adu, MD   Preanesthetic Checklist Completed: patient identified, IV checked, site marked, risks and benefits discussed, surgical consent, monitors and equipment checked, pre-op evaluation and timeout performed Spinal Block Patient position: sitting Prep: DuraPrep and site prepped and draped Patient monitoring: heart rate, continuous pulse ox, cardiac monitor and blood pressure Approach: midline Location: L3-4 Needle Needle type: Pencan  Needle gauge: 24 G Needle length: 10 cm Assessment Sensory level: T4 Events: CSF return Additional Notes Pt placed in sitting position for spinal placement. Spinal kit expiration date checked and verified. Sterile prep and drape of back. Local applied to injection site. Free flowing clear CSF obtained prior to injecting local anesthetic. - heme. Pt tolerated procedure well. Placed supine after spinal placement

## 2023-05-10 NOTE — Anesthesia Postprocedure Evaluation (Signed)
Anesthesia Post Note  Patient: Keith Schwartz  Procedure(s) Performed: TOTAL KNEE ARTHROPLASTY (Left: Knee)     Patient location during evaluation: PACU Anesthesia Type: Spinal Level of consciousness: oriented and awake and alert Pain management: pain level controlled Vital Signs Assessment: post-procedure vital signs reviewed and stable Respiratory status: spontaneous breathing and respiratory function stable Cardiovascular status: blood pressure returned to baseline and stable Postop Assessment: no headache, no backache, no apparent nausea or vomiting, spinal receding and patient able to bend at knees Anesthetic complications: no  No notable events documented.  Last Vitals:  Vitals:   05/10/23 0945 05/10/23 1004  BP: 110/62 108/71  Pulse: 65 67  Resp: 10 12  Temp: 36.7 C   SpO2: 93% 97%    Last Pain:  Vitals:   05/10/23 1004  TempSrc:   PainSc: 0-No pain                 Dejana Pugsley,W. EDMOND

## 2023-05-11 ENCOUNTER — Encounter (HOSPITAL_COMMUNITY): Payer: Self-pay | Admitting: Orthopedic Surgery

## 2023-05-11 DIAGNOSIS — M1712 Unilateral primary osteoarthritis, left knee: Secondary | ICD-10-CM | POA: Diagnosis not present

## 2023-05-11 LAB — CBC
HCT: 34.7 % — ABNORMAL LOW (ref 39.0–52.0)
Hemoglobin: 11.4 g/dL — ABNORMAL LOW (ref 13.0–17.0)
MCH: 31 pg (ref 26.0–34.0)
MCHC: 32.9 g/dL (ref 30.0–36.0)
MCV: 94.3 fL (ref 80.0–100.0)
Platelets: 155 10*3/uL (ref 150–400)
RBC: 3.68 MIL/uL — ABNORMAL LOW (ref 4.22–5.81)
RDW: 13 % (ref 11.5–15.5)
WBC: 12.9 10*3/uL — ABNORMAL HIGH (ref 4.0–10.5)
nRBC: 0 % (ref 0.0–0.2)

## 2023-05-11 LAB — BASIC METABOLIC PANEL
Anion gap: 8 (ref 5–15)
BUN: 32 mg/dL — ABNORMAL HIGH (ref 8–23)
CO2: 23 mmol/L (ref 22–32)
Calcium: 8.3 mg/dL — ABNORMAL LOW (ref 8.9–10.3)
Chloride: 108 mmol/L (ref 98–111)
Creatinine, Ser: 1.18 mg/dL (ref 0.61–1.24)
GFR, Estimated: >60 mL/min (ref >60)
Glucose, Bld: 236 mg/dL — ABNORMAL HIGH (ref 70–99)
Potassium: 4.2 mmol/L (ref 3.5–5.1)
Sodium: 139 mmol/L (ref 135–145)

## 2023-05-11 LAB — GLUCOSE, CAPILLARY
Glucose-Capillary: 193 mg/dL — ABNORMAL HIGH (ref 70–99)
Glucose-Capillary: 197 mg/dL — ABNORMAL HIGH (ref 70–99)

## 2023-05-11 MED ORDER — ASPIRIN 81 MG PO CHEW: 81.0000 mg | CHEWABLE_TABLET | Freq: Two times a day (BID) | ORAL | 0 refills | Status: AC

## 2023-05-11 MED ORDER — POLYETHYLENE GLYCOL 3350 17 G PO PACK: 17.0000 g | PACK | Freq: Two times a day (BID) | ORAL | 0 refills | Status: DC

## 2023-05-11 MED ORDER — OXYCODONE HCL 5 MG PO TABS: 5.0000 mg | ORAL_TABLET | ORAL | 0 refills | Status: DC | PRN

## 2023-05-11 MED ORDER — METHOCARBAMOL 500 MG PO TABS: 500.0000 mg | ORAL_TABLET | Freq: Four times a day (QID) | ORAL | 2 refills | Status: DC | PRN

## 2023-05-11 NOTE — Plan of Care (Signed)
  Problem: Education: Goal: Knowledge of the prescribed therapeutic regimen will improve Outcome: Progressing Goal: Individualized Educational Video(s) Outcome: Progressing   Problem: Activity: Goal: Ability to avoid complications of mobility impairment will improve Outcome: Progressing Goal: Range of joint motion will improve Outcome: Progressing   Problem: Clinical Measurements: Goal: Postoperative complications will be avoided or minimized Outcome: Progressing   Problem: Pain Management: Goal: Pain level will decrease with appropriate interventions Outcome: Progressing   Problem: Skin Integrity: Goal: Will show signs of wound healing Outcome: Progressing   Problem: Education: Goal: Ability to describe self-care measures that may prevent or decrease complications (Diabetes Survival Skills Education) will improve Outcome: Progressing Goal: Individualized Educational Video(s) Outcome: Progressing   Problem: Coping: Goal: Ability to adjust to condition or change in health will improve Outcome: Progressing   Problem: Fluid Volume: Goal: Ability to maintain a balanced intake and output will improve Outcome: Progressing   Problem: Health Behavior/Discharge Planning: Goal: Ability to identify and utilize available resources and services will improve Outcome: Progressing Goal: Ability to manage health-related needs will improve Outcome: Progressing   Problem: Metabolic: Goal: Ability to maintain appropriate glucose levels will improve Outcome: Progressing   Problem: Nutritional: Goal: Maintenance of adequate nutrition will improve Outcome: Progressing Goal: Progress toward achieving an optimal weight will improve Outcome: Progressing   Problem: Skin Integrity: Goal: Risk for impaired skin integrity will decrease Outcome: Progressing   Problem: Tissue Perfusion: Goal: Adequacy of tissue perfusion will improve Outcome: Progressing   Problem: Education: Goal:  Knowledge of General Education information will improve Description: Including pain rating scale, medication(s)/side effects and non-pharmacologic comfort measures Outcome: Progressing   Problem: Health Behavior/Discharge Planning: Goal: Ability to manage health-related needs will improve Outcome: Progressing   Problem: Clinical Measurements: Goal: Ability to maintain clinical measurements within normal limits will improve Outcome: Progressing Goal: Will remain free from infection Outcome: Progressing Goal: Diagnostic test results will improve Outcome: Progressing Goal: Respiratory complications will improve Outcome: Progressing Goal: Cardiovascular complication will be avoided Outcome: Progressing   Problem: Activity: Goal: Risk for activity intolerance will decrease Outcome: Progressing   Problem: Nutrition: Goal: Adequate nutrition will be maintained Outcome: Progressing   Problem: Coping: Goal: Level of anxiety will decrease Outcome: Progressing   Problem: Elimination: Goal: Will not experience complications related to bowel motility Outcome: Progressing Goal: Will not experience complications related to urinary retention Outcome: Progressing   Problem: Pain Managment: Goal: General experience of comfort will improve Outcome: Progressing   Problem: Safety: Goal: Ability to remain free from injury will improve Outcome: Progressing   Problem: Skin Integrity: Goal: Risk for impaired skin integrity will decrease Outcome: Progressing   

## 2023-05-11 NOTE — TOC Transition Note (Signed)
Transition of Care [facility]) - CM/SW Discharge Note  Patient Details  Name: Keith Schwartz MRN: [id number] Date of Birth: 03/24/1951  [facility] CM/SW Contact:  Ewing Schlein, LCSW Phone Number: 05/11/2023, 10:43 AM  Clinical Narrative: Patient is expected to discharge home after working with PT. CSW met with patient to confirm discharge plan. Patient will go home with OPPT at [facility] in [place]. Patient has a rolling walker and cane at home, so there are no DME needs at this time. TOC signing off.    Final next level of care: OP Rehab Barriers to Discharge: No Barriers Identified  Patient Goals and CMS Choice Choice offered to / list presented to : NA  Discharge Plan and Services Additional resources added to the After Visit Summary for          DME Arranged: N/A DME Agency: NA  Social Determinants of Health (SDOH) Interventions SDOH Screenings   Food Insecurity: No Food Insecurity (05/10/2023)  Housing: Low Risk  (05/10/2023)  Transportation Needs: No Transportation Needs (05/10/2023)  Utilities: Not At Risk (05/10/2023)  Depression (PHQ2-9): Low Risk  (02/12/2022)  Tobacco Use: Low Risk  (05/10/2023)   Readmission Risk Interventions     No data to display

## 2023-05-11 NOTE — Progress Notes (Signed)
Physical Therapy Treatment Patient Details Name: Keith Schwartz MRN: [id number] DOB: 07-11-1951 Today's Date: 05/11/2023   History of Present Illness 72 yo male presents to therapy s/p L TKA on 05/10/2023 due to failure of conservative measures. Pt PMH includes but is not limited to: R TKA revision (2023), AICD in situ, sCHF, angina, CAD, gout, HLD, DM II, HTN, MI, and  OSA on CPAP.    PT Comments  POD # 1 am session Spouse present Pt already OOB in recliner.  Assisted with amb in hallway.  Then returned to room to perform some TE's following HEP handout.  Instructed on proper tech, freq as well as use of ICE.   Addressed all mobility questions, discussed appropriate activity, educated on use of ICE.  Pt ready for D/C to home.     Assistance Recommended at Discharge Intermittent Supervision/Assistance  If plan is discharge home, recommend the following:  Can travel by private vehicle    A little help with walking and/or transfers;A little help with bathing/dressing/bathroom;Assistance with cooking/housework;Assist for transportation      Equipment Recommendations  None recommended by PT    Recommendations for Other Services       Precautions / Restrictions Precautions Precautions: Knee;Fall Precaution Comments: no pillow under knee Restrictions Weight Bearing Restrictions: No Other Position/Activity Restrictions: WBAT     Mobility  Bed Mobility               General bed mobility comments: OOB in recliner    Transfers Overall transfer level: Needs assistance Equipment used: Rolling walker (2 wheels) Transfers: Sit to/from Stand Sit to Stand: Supervision           General transfer comment: cues for proper UE placement and safety with truns    Ambulation/Gait Ambulation/Gait assistance: Supervision, Min guard Gait Distance (Feet): 85 Feet Assistive device: Rolling walker (2 wheels) Gait Pattern/deviations: Step-to pattern, Antalgic Gait velocity:  decreased     General Gait Details: cues for safety and proper distance from RW, tolerated an increased distance.   Stairs             Wheelchair Mobility     Tilt Bed    Modified Rankin (Stroke Patients Only)       Balance                                            Cognition Arousal/Alertness: Awake/alert Behavior During Therapy: WFL for tasks assessed/performed Overall Cognitive Status: Within Functional Limits for tasks assessed                                          Exercises  Total Knee Replacement TE's following HEP handout 10 reps B LE ankle pumps 05 reps towel squeezes 05 reps knee presses 05 reps heel slides  05 reps SAQ's 05 reps SLR's 05 reps ABD Educated on use of gait belt to assist with TE's Followed by ICE     General Comments        Pertinent Vitals/Pain Pain Assessment Pain Assessment: 0-10 Pain Score: 4  Pain Location: L knee Pain Descriptors / Indicators: Aching, Constant, Discomfort, Operative site guarding Pain Intervention(s): Monitored during session, Premedicated before session, Repositioned, Ice applied    Home Living  Prior Function            PT Goals (current goals can now be found in the care plan section) Progress towards PT goals: Progressing toward goals    Frequency    7X/week      PT Plan Current plan remains appropriate    Co-evaluation              AM-PAC PT "6 Clicks" Mobility   Outcome Measure  Help needed turning from your back to your side while in a flat bed without using bedrails?: A Little Help needed moving from lying on your back to sitting on the side of a flat bed without using bedrails?: A Little Help needed moving to and from a bed to a chair (including a wheelchair)?: A Little Help needed standing up from a chair using your arms (e.g., wheelchair or bedside chair)?: A Little Help needed to walk in  hospital room?: A Little Help needed climbing 3-5 steps with a railing? : A Little 6 Click Score: 18    End of Session Equipment Utilized During Treatment: Gait belt Activity Tolerance: Patient tolerated treatment well Patient left: in chair;with call bell/phone within reach;with family/visitor present Nurse Communication: Mobility status PT Visit Diagnosis: Unsteadiness on feet (R26.81);Other abnormalities of gait and mobility (R26.89);Muscle weakness (generalized) (M62.81);Difficulty in walking, not elsewhere classified (R26.2);Pain Pain - Right/Left: Left Pain - part of body: Knee;Leg     Time: 5409-8119 PT Time Calculation (min) (ACUTE ONLY): 26 min  Charges:    $Gait Training: 8-22 mins $Therapeutic Exercise: 8-22 mins PT General Charges $$ ACUTE PT VISIT: 1 Visit                    Felecia Shelling  PTA Acute  Rehabilitation Services Office M-F          [phone]

## 2023-05-11 NOTE — Progress Notes (Signed)
Patient ID: Keith Schwartz, male   DOB: November 20, 1950, 72 y.o.   MRN: [id number] Subjective: 1 Day Post-Op Procedure(s) (LRB): TOTAL KNEE ARTHROPLASTY (Left)    Patient reports pain as relatively mild doing well in his room No events over night Ready to get going  Objective:   VITALS:   Vitals:   05/11/23 0143 05/11/23 0559  BP: (!) 111/58 (!) 101/53  Pulse: 73 70  Resp: 17 17  Temp: 97.7 F (36.5 C) 97.9 F (36.6 C)  SpO2: 98% 97%    Neurovascular intact Incision: dressing C/D/I  LABS Recent Labs    05/11/23 0400  HGB 11.4*  HCT 34.7*  WBC 12.9*  PLT 155    Recent Labs    05/11/23 0400  NA 139  K 4.2  BUN 32*  CREATININE 1.18  GLUCOSE 236*    No results for input(s): "LABPT", "INR" in the last 72 hours.   Assessment/Plan: 1 Day Post-Op Procedure(s) (LRB): TOTAL KNEE ARTHROPLASTY (Left)   Advance diet Up with therapy Home today  RTC in 2 weeks Goals reviewed

## 2023-05-17 NOTE — Discharge Summary (Signed)
Patient ID: Keith Schwartz MRN: [id number] DOB/AGE: Jun 19, 1951 72 y.o.  Admit date: 05/10/2023 Discharge date: 05/11/2023  Admission Diagnoses:  Left knee osteoarthritis  Discharge Diagnoses:  Principal Problem:   S/P total knee replacement, left Active Problems:   S/P total knee arthroplasty, left   Past Medical History:  Diagnosis Date   AICD (automatic cardioverter/defibrillator) present    Arthritis    CHF (congestive heart failure) (HCC)    Coronary artery disease    Diabetes mellitus without complication (HCC) 10/18/2000   History of kidney stones    Hypertension 10/18/2000   ICD (implantable cardioverter-defibrillator) in place    Ischemic cardiomyopathy    Myocardial infarction [facility])    Sleep apnea    uses cpap at times    Surgeries: Procedure(s): TOTAL KNEE ARTHROPLASTY on 05/10/2023   Consultants:   Discharged Condition: Improved  Hospital Course: Keith Schwartz is an 72 y.o. male who was admitted 05/10/2023 for operative treatment ofS/P total knee replacement, left. Patient has severe unremitting pain that affects sleep, daily activities, and work/hobbies. After pre-op clearance the patient was taken to the operating room on 05/10/2023 and underwent  Procedure(s): TOTAL KNEE ARTHROPLASTY.    Patient was given perioperative antibiotics:  Anti-infectives (From admission, onward)    Start     Dose/Rate Route Frequency Ordered Stop   05/10/23 1400  ceFAZolin (ANCEF) IVPB 2g/100 mL premix        2 g 200 mL/hr over 30 Minutes Intravenous Every 6 hours 05/10/23 1051 05/10/23 2132   05/10/23 0600  ceFAZolin (ANCEF) IVPB 2g/100 mL premix        2 g 200 mL/hr over 30 Minutes Intravenous On call to O.R. 05/10/23 0534 05/10/23 0757        Patient was given sequential compression devices, early ambulation, and chemoprophylaxis to prevent DVT. Patient worked with PT and was meeting their goals regarding safe ambulation and transfers.  Patient benefited  maximally from hospital stay and there were no complications.    Recent vital signs: No data found.   Recent laboratory studies: No results for input(s): "WBC", "HGB", "HCT", "PLT", "NA", "K", "CL", "CO2", "BUN", "CREATININE", "GLUCOSE", "INR", "CALCIUM" in the last 72 hours.  Invalid input(s): "PT", "2"   Discharge Medications:   Allergies as of 05/11/2023       Reactions   Glipizide Hives, Rash   Penicillins Rash   Add as: Penicillin G/ Childhood Tolerated Cephalosporin Date: 03/16/22.        Medication List     STOP taking these medications    aspirin EC 81 MG tablet Replaced by: aspirin 81 MG chewable tablet       TAKE these medications    acetaminophen 500 MG tablet Commonly known as: TYLENOL Take 500-1,000 mg by mouth every 6 (six) hours as needed for moderate pain.   aspirin 81 MG chewable tablet Chew 1 tablet (81 mg total) by mouth 2 (two) times daily for 28 days. Replaces: aspirin EC 81 MG tablet   atorvastatin 80 MG tablet Commonly known as: LIPITOR Take 1 tablet (80 mg total) by mouth daily.   BLOOD GLUCOSE TEST STRIPS Strp Please dispense based on patient and insurance preference. Use as directed to monitor FSBS 3x daily. Dx: [id number].   carvedilol 12.5 MG tablet Commonly known as: COREG Take 1 tablet (12.5 mg total) by mouth 2 (two) times daily.   cholecalciferol 25 MCG (1000 UNIT) tablet Commonly known as: VITAMIN D3 Take 1,000 Units by mouth daily.  diclofenac Sodium 1 % Gel Commonly known as: VOLTAREN Apply 2 g topically 4 (four) times daily as needed (pain).   Edex 40 MCG injection Generic drug: alprostadil 40 mcg by Intracavitary route as needed for erectile dysfunction.   Entresto 24-26 MG Generic drug: sacubitril-valsartan Take 1 tablet by mouth 2 (two) times daily.   glimepiride 4 MG tablet Commonly known as: AMARYL Take 4 mg by mouth daily.   hydrOXYzine 10 MG tablet Commonly known as: ATARAX Take 10 mg by mouth every 8  (eight) hours as needed for itching.   ketoconazole 2 % cream Commonly known as: NIZORAL Apply 1 Application topically daily as needed for irritation.   lidocaine 2 % solution Commonly known as: XYLOCAINE Use as directed 15 mLs in the mouth or throat as needed for mouth pain. What changed: reasons to take this   loperamide 2 MG tablet Commonly known as: IMODIUM A-D Take 2-4 mg by mouth 4 (four) times daily as needed for diarrhea or loose stools.   Magnesium 500 MG Caps Take 500 mg by mouth daily.   metFORMIN 500 MG 24 hr tablet Commonly known as: GLUCOPHAGE-XR Take 1,000 mg by mouth 2 (two) times daily.   methocarbamol 500 MG tablet Commonly known as: ROBAXIN Take 1 tablet (500 mg total) by mouth every 6 (six) hours as needed for muscle spasms.   multivitamin tablet Take 1 tablet by mouth in the morning. MEN'S ONE A DAY 50+   nitroGLYCERIN 0.4 MG SL tablet Commonly known as: NITROSTAT Place 1 tablet (0.4 mg total) under the tongue every 5 (five) minutes as needed for chest pain.   oxyCODONE 5 MG immediate release tablet Commonly known as: Oxy IR/ROXICODONE Take 1 tablet (5 mg total) by mouth every 4 (four) hours as needed for severe pain.   Ozempic (2 MG/DOSE) 8 MG/3ML Sopn Generic drug: Semaglutide (2 MG/DOSE) Inject 2 mg into the skin every Thursday.   polyethylene glycol 17 g packet Commonly known as: MIRALAX / GLYCOLAX Take 17 g by mouth 2 (two) times daily.   pramipexole 1 MG tablet Commonly known as: MIRAPEX Take 1 mg by mouth at bedtime.   senna-docusate 8.6-50 MG tablet Commonly known as: Senokot-S Take 1-2 tablets by mouth daily as needed for mild constipation.   spironolactone 25 MG tablet Commonly known as: ALDACTONE TAKE 1/2 TABLET(12.5 MG) BY MOUTH DAILY   vitamin C 1000 MG tablet Take 1,000 mg by mouth in the morning.   zinc gluconate 50 MG tablet Take 50 mg by mouth daily.               Discharge Care Instructions  (From  admission, onward)           Start     Ordered   05/11/23 0000  Change dressing       Comments: Maintain surgical dressing until follow up in the clinic. If the edges start to pull up, may reinforce with tape. If the dressing is no longer working, may remove and cover with gauze and tape, but must keep the area dry and clean.  Call with any questions or concerns.   05/11/23 0752            Diagnostic Studies: CUP PACEART REMOTE DEVICE CHECK  Result Date: 05/03/2023 Scheduled remote reviewed. Normal device function.  Next remote 91 days. Hassell Halim, RN, CCDS, [employer]   Disposition: Discharge disposition: 01-Home or Self Care       Discharge Instructions  Call MD / Call 911   Complete by: As directed    If you experience chest pain or shortness of breath, CALL 911 and be transported to the hospital emergency room.  If you develope a fever above 101 F, pus (white drainage) or increased drainage or redness at the wound, or calf pain, call your surgeon's office.   Change dressing   Complete by: As directed    Maintain surgical dressing until follow up in the clinic. If the edges start to pull up, may reinforce with tape. If the dressing is no longer working, may remove and cover with gauze and tape, but must keep the area dry and clean.  Call with any questions or concerns.   Constipation Prevention   Complete by: As directed    Drink plenty of fluids.  Prune juice may be helpful.  You may use a stool softener, such as Colace (over the counter) 100 mg twice a day.  Use MiraLax (over the counter) for constipation as needed.   Diet - low sodium heart healthy   Complete by: As directed    Increase activity slowly as tolerated   Complete by: As directed    Weight bearing as tolerated with assist device (walker, cane, etc) as directed, use it as long as suggested by your surgeon or therapist, typically at least 4-6 weeks.   Post-operative opioid taper instructions:    Complete by: As directed    POST-OPERATIVE OPIOID TAPER INSTRUCTIONS: It is important to wean off of your opioid medication as soon as possible. If you do not need pain medication after your surgery it is ok to stop day one. Opioids include: Codeine, Hydrocodone(Norco, Vicodin), Oxycodone(Percocet, oxycontin) and hydromorphone amongst others.  Long term and even short term use of opiods can cause: Increased pain response Dependence Constipation Depression Respiratory depression And more.  Withdrawal symptoms can include Flu like symptoms Nausea, vomiting And more Techniques to manage these symptoms Hydrate well Eat regular healthy meals Stay active Use relaxation techniques(deep breathing, meditating, yoga) Do Not substitute Alcohol to help with tapering If you have been on opioids for less than two weeks and do not have pain than it is ok to stop all together.  Plan to wean off of opioids This plan should start within one week post op of your joint replacement. Maintain the same interval or time between taking each dose and first decrease the dose.  Cut the total daily intake of opioids by one tablet each day Next start to increase the time between doses. The last dose that should be eliminated is the evening dose.      TED hose   Complete by: As directed    Use stockings (TED hose) for 2 weeks on both leg(s).  You may remove them at night for sleeping.        Follow-up Information     Durene Romans, MD. Schedule an appointment as soon as possible for a visit in 2 week(s).   Specialty: Orthopedic Surgery Contact information: [address] [place] [address] [phone]                  Signed: Cassandria Anger 05/17/2023, 9:00 AM

## 2023-05-17 NOTE — Progress Notes (Signed)
Remote ICD transmission.   

## 2023-06-20 NOTE — Progress Notes (Unsigned)
Cardiology Office Note:    Date:  06/23/2023   ID:  Keith Schwartz, DOB May 22, 1951, MRN [id number]  PCP:  Tenna Delaine, MD  Cardiologist:  Little Ishikawa, MD  Electrophysiologist:  Lewayne Bunting, MD   Referring MD: Donita Brooks, MD   Chief Complaint  Patient presents with   Congestive Heart Failure     History of Present Illness:    Keith Schwartz is a 72 y.o. male with a hx of CAD, chronic combined systolic and also heart failure, T2DM, hypertension who presents for follow-up.  He was referred by Dr. Tanya Nones for evaluation of syncope, initially seen on 11/12/2020.  He had an episode of syncope on 10/16/2020.  Reports he had bronchitis at the time and had taken DayQuil that morning, in addition to taking lisinopril.  He was at [facility] and had checked his blood pressure and noted it was low, 90s over 60s.  Subsequently was standing in the checkout line and suddenly passed out.  Denies any prodromal symptoms.  States that he fell to the ground, did not hit his head.  Thinks he was unconscious only for a few seconds.  EMS was called and reportedly BP was down to 70s.  He received IV fluids and BP up to 90s on arrival to ED.  In ED, EKG showed left bundle branch block.  Labs notable for creatinine 1.75.  He has not taken lisinopril since discharge from ED.  He denies any lightheadedness or syncope since this episode, or prior to that.  Denies any palpitations.  Does report he has been having chest pain.  Describes as pressure in center of upper chest that occurs with exertion.  States that he will continue to exert himself and it will subside, typically last for 1 to 2 minutes.  No smoking history.  Family history includes father had MI and died of CHF in 69s.  Echocardiogram on 12/09/2020 showed EF 30%, normal RV function.  Lexiscan Myoview on 12/09/2020 showed large severe fixed defect involving inferior and anterior walls with preservation of lateral wall and LVEF 24%,  suggesting extensive infarct.  Cardiac catheterization on 12/22/2020 showed diffusely diseased LAD with positive RFR, nonobstructive LCx stenosis, mild diffuse RCA disease, normal right heart pressures.  Medical therapy recommended.  Zio patch x14 days on 12/15/2020 showed 1 episode of NSVT lasting 4 beats, 3 episodes of SVT longest lasting 10 beats.  Echocardiogram 05/19/21 shows LVEF 30 to 35%, dyssynchrony consistent with LBBB, normal RV function, no significant valvular disease.  Cardiac MRI on 07/13/2021 showed LV EF 34%, RVEF 52%, RV insertion site LGE.  BiV ICD placed by Dr. Ladona Ridgel on 10/20/2021.  Echo 03/10/22 showed improvement in LVEF to 45-50%.   Since last clinic visit, he reports he is doing okay.  Recently underwent knee surgery.  Denies any chest pain, dyspnea, lightheadedness, syncope, lower extremity edema, or palpitations.  Wt Readings from Last 3 Encounters:  06/23/23 231 lb 6.4 oz (105 kg)  05/10/23 229 lb 4.5 oz (104 kg)  05/04/23 230 lb (104.3 kg)     Past Medical History:  Diagnosis Date   AICD (automatic cardioverter/defibrillator) present    Arthritis    CHF (congestive heart failure) (HCC)    Coronary artery disease    Diabetes mellitus without complication (HCC) 10/18/2000   History of kidney stones    Hypertension 10/18/2000   ICD (implantable cardioverter-defibrillator) in place    Ischemic cardiomyopathy    Myocardial infarction (HCC)  Sleep apnea    uses cpap at times    Past Surgical History:  Procedure Laterality Date   APPENDECTOMY  10/18/1968   BIV ICD INSERTION CRT-D N/A 10/20/2021   Procedure: BIV ICD INSERTION CRT-D;  Surgeon: Marinus Maw, MD;  Location[facility] INVASIVE CV LAB;  Service: Cardiovascular;  Laterality: N/A;   CORONARY PRESSURE/FFR STUDY N/A 12/22/2020   Procedure: INTRAVASCULAR PRESSURE WIRE/FFR STUDY;  Surgeon: Tonny Bollman, MD;  Location: [facility] INVASIVE CV LAB;  Service: Cardiovascular;  Laterality: N/A;   CYSTOSCOPY W/ STONE  MANIPULATION Left 1993   HERNIA REPAIR Bilateral 1976   groin and 1979   JOINT REPLACEMENT  10/18/2010   rt knee   NASAL SEPTUM SURGERY  1984   RIGHT/LEFT HEART CATH AND CORONARY ANGIOGRAPHY N/A 12/22/2020   Procedure: RIGHT/LEFT HEART CATH AND CORONARY ANGIOGRAPHY;  Surgeon: Tonny Bollman, MD;  Location: [facility] INVASIVE CV LAB;  Service: Cardiovascular;  Laterality: N/A;   thumb surgery Right 2019   TOTAL KNEE ARTHROPLASTY Left 05/10/2023   Procedure: TOTAL KNEE ARTHROPLASTY;  Surgeon: Durene Romans, MD;  Location: [facility] ORS;  Service: Orthopedics;  Laterality: Left;   TOTAL KNEE REVISION Right 03/16/2022   Procedure: TOTAL KNEE REVISION;  Surgeon: Durene Romans, MD;  Location: [facility] ORS;  Service: Orthopedics;  Laterality: Right;   VASECTOMY  10/18/1985    Current Medications: Current Meds  Medication Sig   acetaminophen (TYLENOL) 500 MG tablet Take 500-1,000 mg by mouth every 6 (six) hours as needed for moderate pain.   Ascorbic Acid (VITAMIN C) 1000 MG tablet Take 1,000 mg by mouth in the morning.   atorvastatin (LIPITOR) 80 MG tablet Take 1 tablet (80 mg total) by mouth daily.   carvedilol (COREG) 12.5 MG tablet Take 1 tablet (12.5 mg total) by mouth 2 (two) times daily.   cholecalciferol (VITAMIN D3) 25 MCG (1000 UNIT) tablet Take 1,000 Units by mouth daily.   diclofenac Sodium (VOLTAREN) 1 % GEL Apply 2 g topically 4 (four) times daily as needed (pain).   empagliflozin (JARDIANCE) 10 MG TABS tablet Take 1 tablet (10 mg total) by mouth daily before breakfast.   glimepiride (AMARYL) 4 MG tablet Take 4 mg by mouth daily.   Glucose Blood (BLOOD GLUCOSE TEST STRIPS) STRP Please dispense based on patient and insurance preference. Use as directed to monitor FSBS 3x daily. Dx: W1027.   hydrOXYzine (ATARAX) 10 MG tablet Take 10 mg by mouth every 8 (eight) hours as needed for itching.   ketoconazole (NIZORAL) 2 % cream Apply 1 Application topically daily as needed for irritation.   lidocaine  (XYLOCAINE) 2 % solution Use as directed 15 mLs in the mouth or throat as needed for mouth pain. (Patient taking differently: Use as directed 15 mLs in the mouth or throat as needed (sore throat).)   loperamide (IMODIUM A-D) 2 MG tablet Take 2-4 mg by mouth 4 (four) times daily as needed for diarrhea or loose stools.   Magnesium 500 MG CAPS Take 500 mg by mouth daily.   metFORMIN (GLUCOPHAGE-XR) 500 MG 24 hr tablet Take 1,000 mg by mouth 2 (two) times daily.   methocarbamol (ROBAXIN) 500 MG tablet Take 1 tablet (500 mg total) by mouth every 6 (six) hours as needed for muscle spasms.   Multiple Vitamin (MULTIVITAMIN) tablet Take 1 tablet by mouth in the morning. MEN'S ONE A DAY 50+   oxyCODONE (OXY IR/ROXICODONE) 5 MG immediate release tablet Take 1 tablet (5 mg total) by mouth every 4 (four) hours  as needed for severe pain.   polyethylene glycol (MIRALAX / GLYCOLAX) 17 g packet Take 17 g by mouth 2 (two) times daily.   pramipexole (MIRAPEX) 1 MG tablet Take 1 mg by mouth at bedtime.   sacubitril-valsartan (ENTRESTO) 24-26 MG Take 1 tablet by mouth 2 (two) times daily.   Semaglutide, 2 MG/DOSE, (OZEMPIC, 2 MG/DOSE,) 8 MG/3ML SOPN Inject 2 mg into the skin every Thursday.   senna-docusate (SENOKOT-S) 8.6-50 MG tablet Take 1-2 tablets by mouth daily as needed for mild constipation.   spironolactone (ALDACTONE) 25 MG tablet TAKE 1/2 TABLET(12.5 MG) BY MOUTH DAILY   zinc gluconate 50 MG tablet Take 50 mg by mouth daily.     Allergies:   Glipizide and Penicillins   Social History   Socioeconomic History   Marital status: Married    Spouse name: Not on file   Number of children: Not on file   Years of education: Not on file   Highest education level: Not on file  Occupational History   Not on file  Tobacco Use   Smoking status: Never   Smokeless tobacco: Never  Vaping Use   Vaping status: Never Used  Substance and Sexual Activity   Alcohol use: Not Currently    Alcohol/week: 1.0 standard  drink of alcohol    Types: 1 Cans of beer per week   Drug use: Never   Sexual activity: Not Currently    Birth control/protection: Surgical  Other Topics Concern   Not on file  Social History Narrative   Lives in [place] with spouse.   Retired Visual merchandiser.   Former Chief Technology Officer, Engineer, site, Printmaker    Social Determinants of Corporate investment banker Strain: Not on file  Food Insecurity: No Food Insecurity (05/10/2023)   Hunger Vital Sign    Worried About Running Out of Food in the Last Year: Never true    Ran Out of Food in the Last Year: Never true  Transportation Needs: No Transportation Needs (05/10/2023)   PRAPARE - Administrator, Civil Service (Medical): No    Lack of Transportation (Non-Medical): No  Physical Activity: Not on file  Stress: Not on file  Social Connections: Not on file     Family History: The patient's family history includes Arthritis in his mother; Asthma in his mother; Early death in his sister; Hearing loss in his mother; Heart disease in his father; Hyperlipidemia in his father; Hypertension in his father; Vision loss in his mother.  ROS:   Please see the history of present illness. All other systems reviewed and are negative.  EKGs/Labs/Other Studies Reviewed:    The following studies were reviewed today:  EKG:   12/30/2020: normal sinus rhythm, rate 80, left bundle branch block 06/23/23: BiV paced   Recent Labs: 05/11/2023: BUN 32; Creatinine, Ser 1.18; Hemoglobin 11.4; Platelets 155; Potassium 4.2; Sodium 139  Recent Lipid Panel    Component Value Date/Time   CHOL 109 06/02/2021 1205   TRIG 194 (H) 06/02/2021 1205   HDL 30 (L) 06/02/2021 1205   CHOLHDL 3.6 06/02/2021 1205   LDLCALC 47 06/02/2021 1205    Physical Exam:    VS:  BP 128/82   Pulse 79   Ht 6\' 3"  (1.905 m)   Wt 231 lb 6.4 oz (105 kg)   SpO2 99%   BMI 28.92 kg/m     Wt Readings from Last 3 Encounters:  06/23/23 231 lb 6.4 oz (105 kg)  05/10/23 229  lb  4.5 oz (104 kg)  05/04/23 230 lb (104.3 kg)     GEN: Well nourished, well developed in no acute distress HEENT: Normal NECK: No JVD CARDIAC:RRR, 2 out of 6 systolic murmur RESPIRATORY:  Clear to auscultation without rales, wheezing or rhonchi  ABDOMEN: Soft, non-tender, non-distended MUSCULOSKELETAL:  Trace edema SKIN: Warm and dry NEUROLOGIC:  Alert and oriented x 3 PSYCHIATRIC:  Normal affect   ASSESSMENT:    1. Chronic combined systolic and diastolic heart failure (HCC)   2. Coronary artery disease involving native coronary artery of native heart with angina pectoris (HCC)   3. Primary hypertension   4. Mixed hyperlipidemia      PLAN:     CAD: Reported chest pain concerning for angina and EKG with left bundle branch block.  Echocardiogram on 12/09/2020 showed EF 30%, normal RV function.  Lexiscan Myoview on 12/09/2020 showed large severe fixed defect involving inferior and anterior walls with preservation of lateral wall and LVEF 24%, suggesting extensive infarct.  Cardiac catheterization on 12/22/2020 showed diffusely diseased LAD with positive RFR, nonobstructive LCx stenosis, mild diffuse RCA disease, normal right heart pressures.  Medical management recommended for obstructive LAD disease.  Reports occasional exertional chest pain, has not had to use nitroglycerin -Continue aspirin 81 mg daily -Continue atorvastatin 80 mg daily.  LDL 47 on 06/02/2021 -Continue carvedilol 12.5 mg twice daily -As needed sublingual nitroglycerin.   Chronic combined systolic and diastolic heart failure: EF 30% on echocardiogram 12/09/2020.  Diffuse severe LAD disease as above, medical management recommended.  Echocardiogram 05/19/21 shows LVEF 30 to 35%, dyssynchrony consistent with LBBB.  Cardiac MRI on 07/13/2021 showed LV EF 34%, RVEF 52%, RV insertion site LGE.  EF remained <35% despite GDMT, and considering his LBBB with QRS , referred to EP and underwent BiV ICD placement on 10/20/2021. Echo  03/10/22 showed improvement in LVEF to 45-50%. -Continue Entresto 24-26 mg twice daily.  Developed hypotension/hyperkalemia on 49-51 mg BID -Continue carvedilol 12.5 mg twice daily -Previously was on Jardiance but was discontinued as reports he may have had a reaction to the Jardiance-metformin combination pill.  He previously tolerated Jardiance without issues.  Would recommend restarting Jardiance 10 mg daily.  Check BMET in 1 week -Continue spironolactone 12.5 mg daily -Recent device interrogation shows 99% BiV pacing.   -Repeat echocardiogram prior to next clinic visit   Syncope: Suspect due to hypotension, which may have been due to dehydration in setting of URI and continuing to take lisinopril.  SBP in 70s on EMS arrival.  No prodromal symptoms, arrhythmia remains on differential.  Echocardiogram on 12/09/2020 showed EF 30%, normal RV function.   Zio patch x14 days on 12/15/2020 showed 1 episode of NSVT lasting 4 beats, 3 episodes of SVT longest lasting 10 beats.   Hypertension: Continue Entresto, carvedilol, and spironolactone as above   Hyperlipidemia: On atorvastatin 80 mg daily.  LDL 47 on 06/02/2021   T2DM: On metformin, glimepiride, Trulicity, Jardiance.  A1c 8.3% on 01/10/2023   CKD stage 3a: Creatinine 1.75 on 10/16/2020, improved to creatinine 1.2 on 05/11/2023   Leg pain: ABIs indicate noncompressible vessels, normal TBI's on 01/08/2021.  Lower extremity duplex shows heavy calcifications, questionable distal occlusion in the left posterior tibial artery.  Obesity: on Ozempic     RTC in 4 months    Medication Adjustments/Labs and Tests Ordered: Current medicines are reviewed at length with the patient today.  Concerns regarding medicines are outlined above.  Orders Placed This Encounter  Procedures  Basic metabolic panel   EKG 12-Lead   ECHOCARDIOGRAM COMPLETE    Meds ordered this encounter  Medications   empagliflozin (JARDIANCE) 10 MG TABS tablet    Sig: Take 1  tablet (10 mg total) by mouth daily before breakfast.    Dispense:  30 tablet    Refill:  11     Patient Instructions  Medication Instructions:  Jardiance 10 mg daily *If you need a refill on your cardiac medications before your next appointment, please call your pharmacy*   Lab Work: BMP- in one week If you have labs (blood work) drawn today and your tests are completely normal, you will receive your results only by: MyChart Message (if you have MyChart) OR A paper copy in the mail If you have any lab test that is abnormal or we need to change your treatment, we will call you to review the results.   Testing/Procedures: Your physician has requested that you have an echocardiogram in 3 months. Echocardiography is a painless test that uses sound waves to create images of your heart. It provides your doctor with information about the size and shape of your heart and how well your heart's chambers and valves are working. This procedure takes approximately one hour. There are no restrictions for this procedure. Please do NOT wear cologne, perfume, aftershave, or lotions (deodorant is allowed). Please arrive 15 minutes prior to your appointment time.    Follow-Up: At [facility], you and your health needs are our priority.  As part of our continuing mission to provide you with exceptional heart care, we have created designated Provider Care Teams.  These Care Teams include your primary Cardiologist (physician) and Advanced Practice Providers (APPs -  Physician Assistants and Nurse Practitioners) who all work together to provide you with the care you need, when you need it.  We recommend signing up for the patient portal called "MyChart".  Sign up information is provided on this After Visit Summary.  MyChart is used to connect with patients for Virtual Visits (Telemedicine).  Patients are able to view lab/test results, encounter notes, upcoming appointments, etc.  Non-urgent messages  can be sent to your provider as well.   To learn more about what you can do with MyChart, go to [email or web address].    Your next appointment:   4 month(s)  Provider:   Little Ishikawa, MD         Signed, Little Ishikawa, MD  06/23/2023 10:16 PM    [facility]

## 2023-06-23 ENCOUNTER — Encounter: Payer: Self-pay | Admitting: Cardiology

## 2023-06-23 ENCOUNTER — Ambulatory Visit: Payer: Medicare Other | Attending: Cardiology | Admitting: Cardiology

## 2023-06-23 VITALS — BP 128/82 | HR 79 | Ht 75.0 in | Wt 231.4 lb

## 2023-06-23 DIAGNOSIS — I10 Essential (primary) hypertension: Secondary | ICD-10-CM | POA: Diagnosis not present

## 2023-06-23 DIAGNOSIS — I5042 Chronic combined systolic (congestive) and diastolic (congestive) heart failure: Secondary | ICD-10-CM | POA: Diagnosis not present

## 2023-06-23 DIAGNOSIS — E782 Mixed hyperlipidemia: Secondary | ICD-10-CM | POA: Insufficient documentation

## 2023-06-23 DIAGNOSIS — I25119 Atherosclerotic heart disease of native coronary artery with unspecified angina pectoris: Secondary | ICD-10-CM | POA: Diagnosis not present

## 2023-06-23 IMAGING — DX DG CHEST 2V
2 series · 2 of 2 positions shown · non-contrast
Comparison: Chest x-ray 01/27/2018

CLINICAL DATA: ICD (implantable cardioverter-defibrillator) in
place

EXAM:
CHEST - 2 VIEW

[w chest pa]
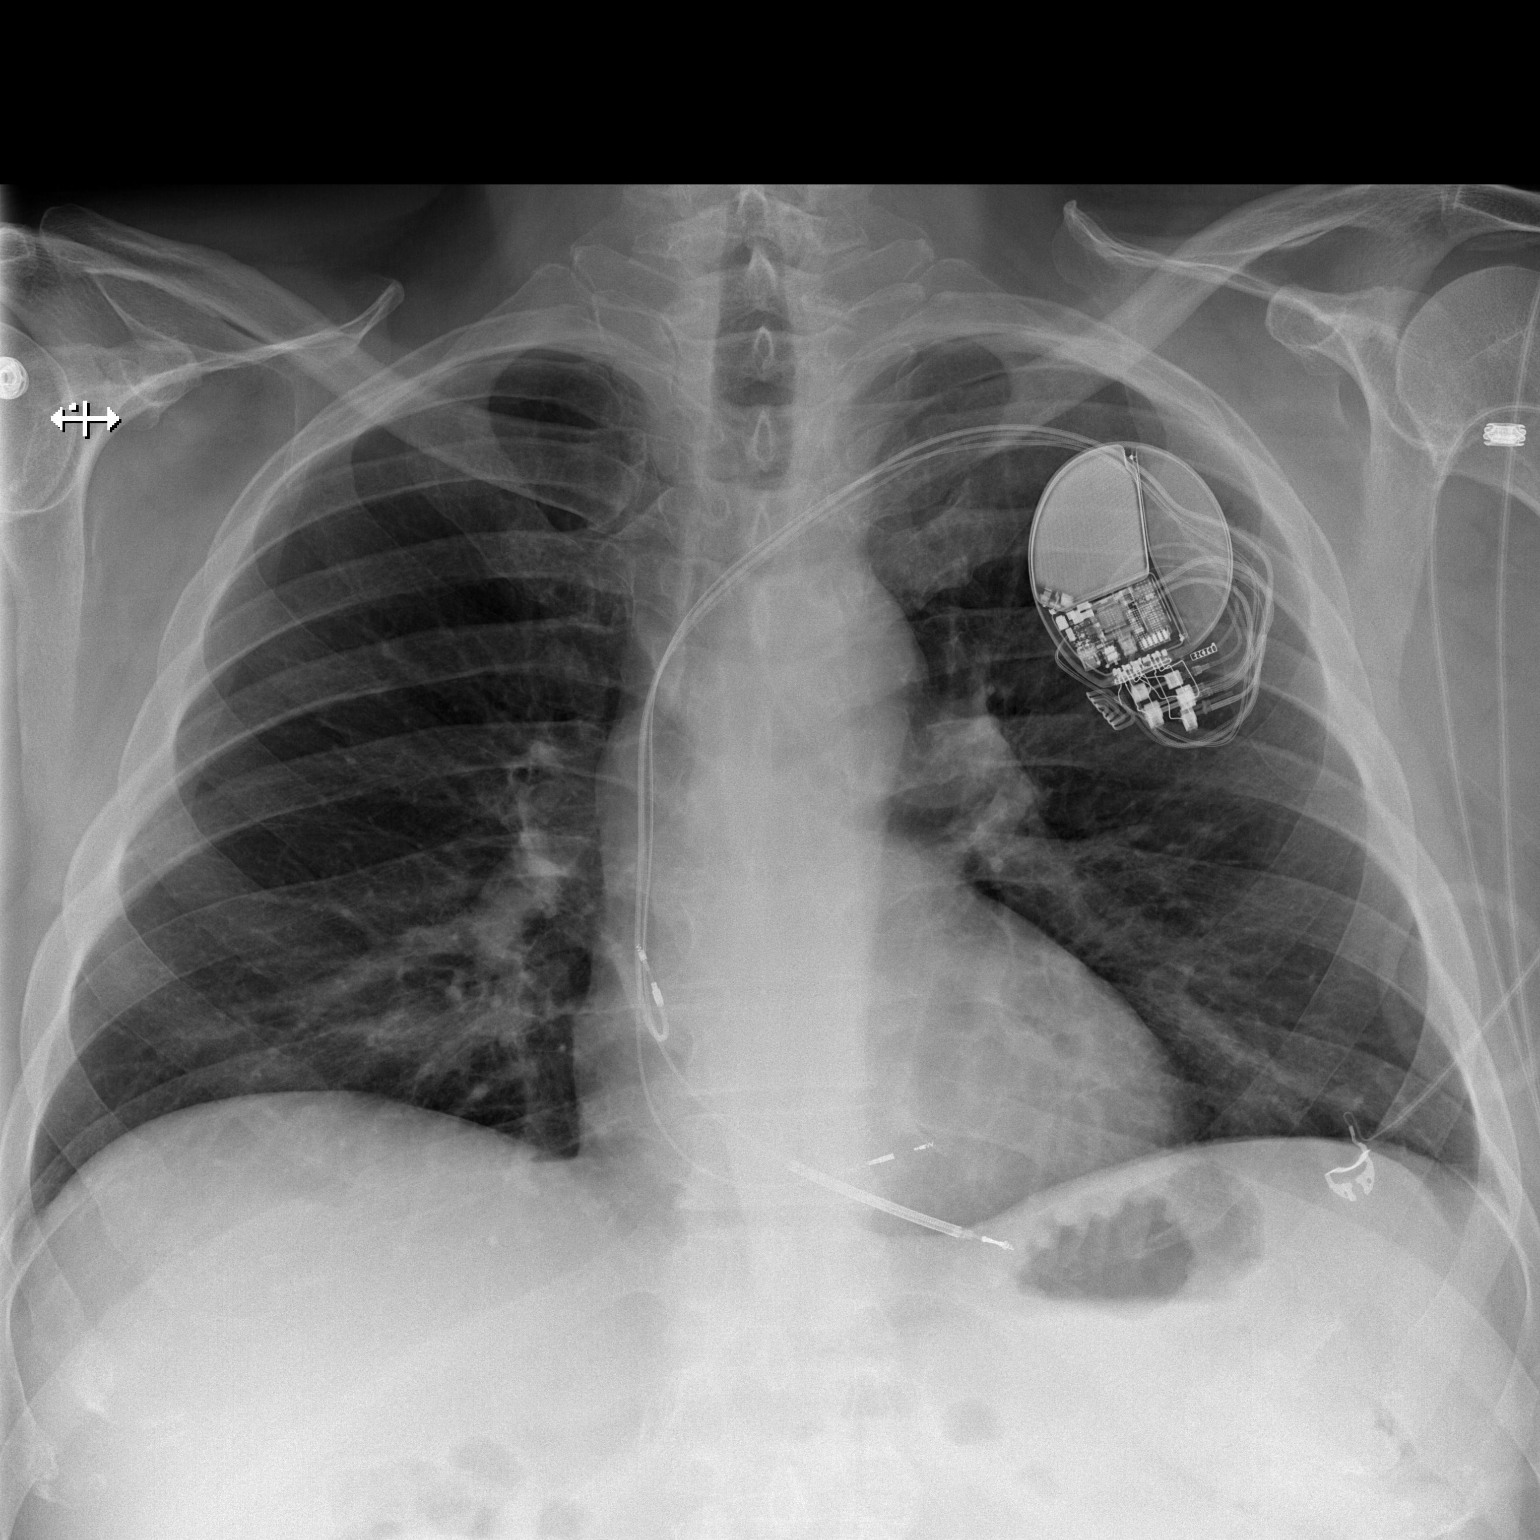

[w chest lat]
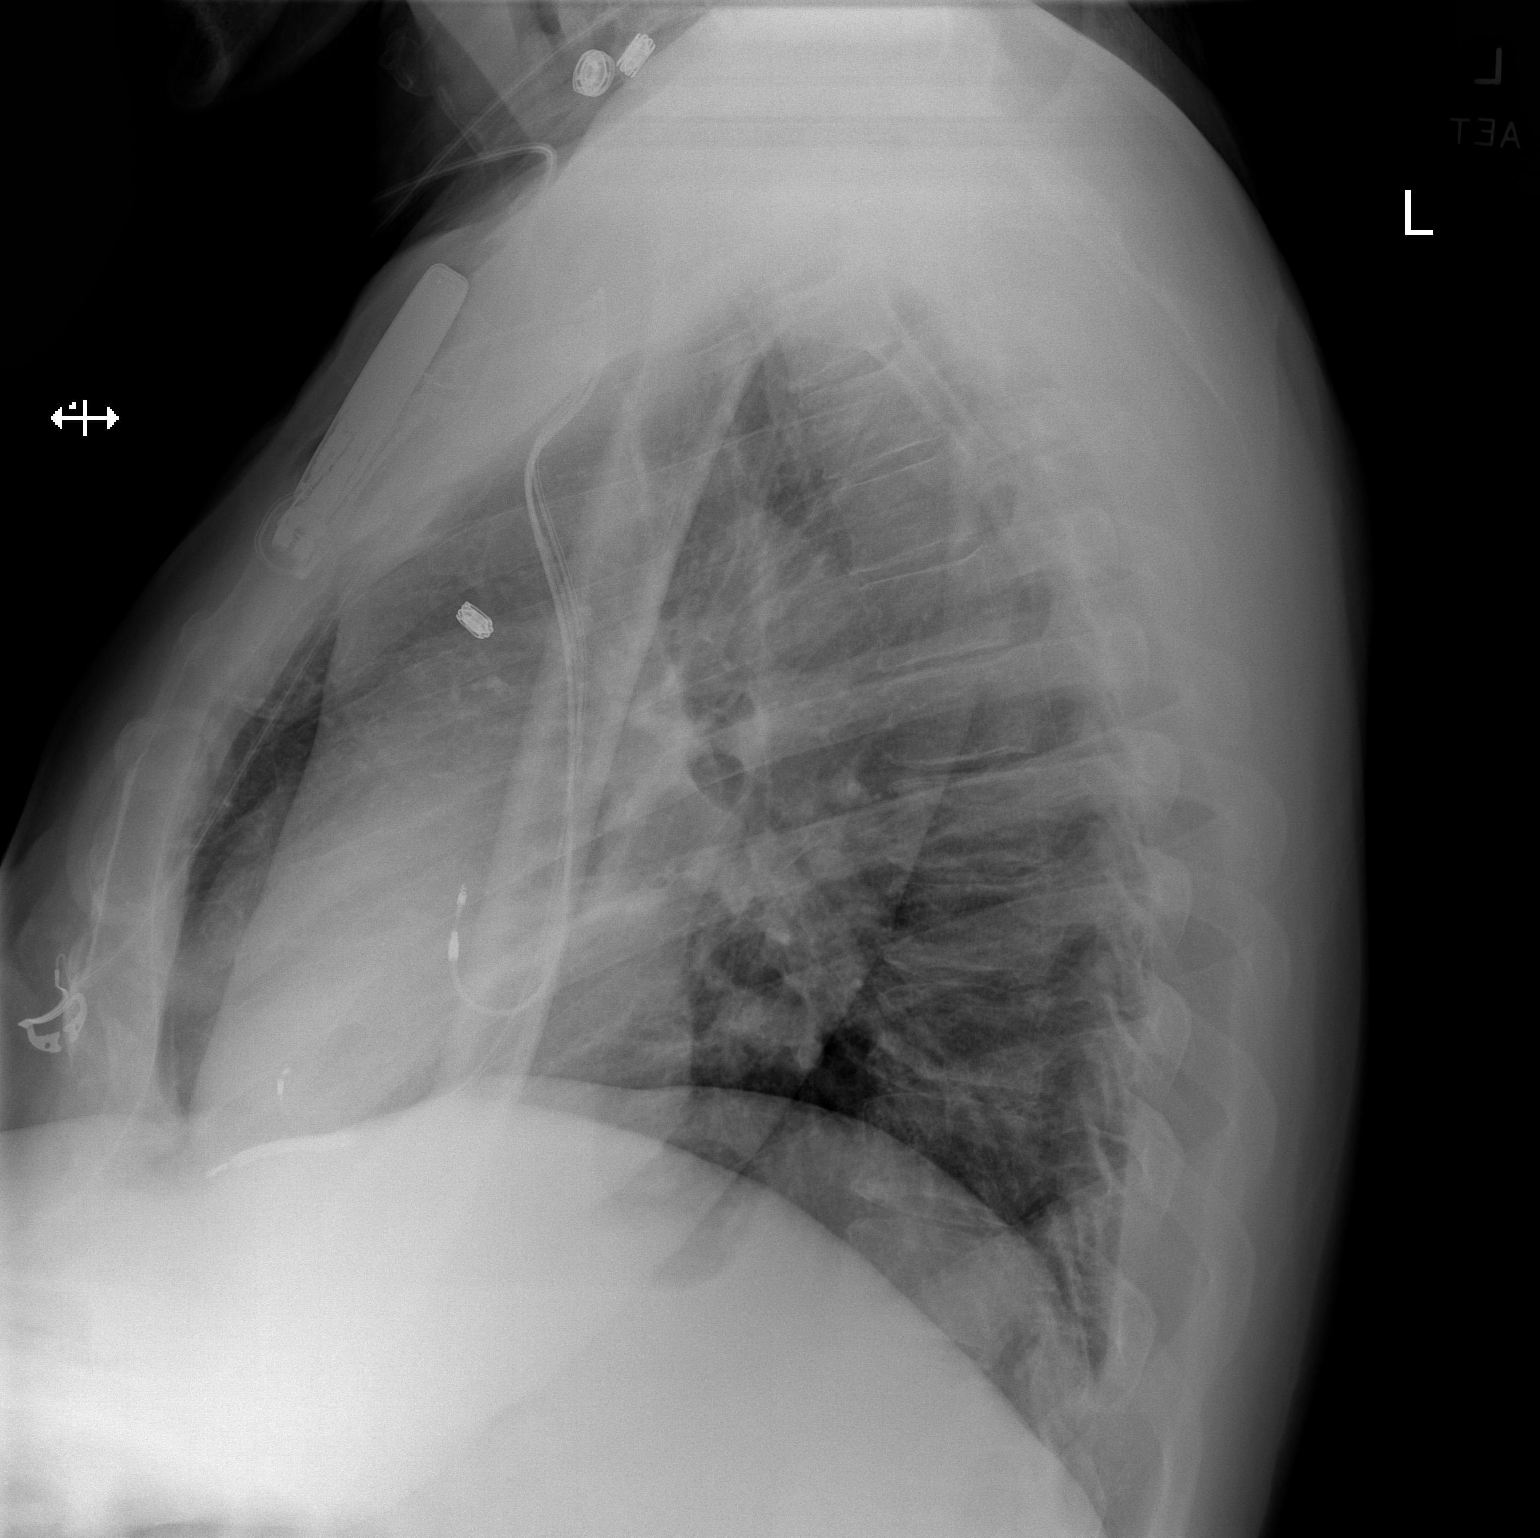

[2 of 2 positions shown; findings below may reference images not displayed]

FINDINGS: Left chest wall cardiac pacemaker and defibrillator with 3 leads in
grossly appropriate position.

The heart and mediastinal contours are within normal limits.

No focal consolidation. No pulmonary edema. No pleural effusion. No
pneumothorax.

No acute osseous abnormality.
IMPRESSION: No active cardiopulmonary disease.

## 2023-06-23 MED ORDER — EMPAGLIFLOZIN 10 MG PO TABS: 10.0000 mg | ORAL_TABLET | Freq: Every day | ORAL | 11 refills | Status: DC

## 2023-06-23 NOTE — Patient Instructions (Signed)
Medication Instructions:  Jardiance 10 mg daily *If you need a refill on your cardiac medications before your next appointment, please call your pharmacy*   Lab Work: BMP- in one week If you have labs (blood work) drawn today and your tests are completely normal, you will receive your results only by: MyChart Message (if you have MyChart) OR A paper copy in the mail If you have any lab test that is abnormal or we need to change your treatment, we will call you to review the results.   Testing/Procedures: Your physician has requested that you have an echocardiogram in 3 months. Echocardiography is a painless test that uses sound waves to create images of your heart. It provides your doctor with information about the size and shape of your heart and how well your heart's chambers and valves are working. This procedure takes approximately one hour. There are no restrictions for this procedure. Please do NOT wear cologne, perfume, aftershave, or lotions (deodorant is allowed). Please arrive 15 minutes prior to your appointment time.    Follow-Up: At [facility], you and your health needs are our priority.  As part of our continuing mission to provide you with exceptional heart care, we have created designated Provider Care Teams.  These Care Teams include your primary Cardiologist (physician) and Advanced Practice Providers (APPs -  Physician Assistants and Nurse Practitioners) who all work together to provide you with the care you need, when you need it.  We recommend signing up for the patient portal called "MyChart".  Sign up information is provided on this After Visit Summary.  MyChart is used to connect with patients for Virtual Visits (Telemedicine).  Patients are able to view lab/test results, encounter notes, upcoming appointments, etc.  Non-urgent messages can be sent to your provider as well.   To learn more about what you can do with MyChart, go to [email or web address].     Your next appointment:   4 month(s)  Provider:   Little Ishikawa, MD

## 2023-07-20 DIAGNOSIS — I5022 Chronic systolic (congestive) heart failure: Secondary | ICD-10-CM | POA: Diagnosis not present

## 2023-07-20 DIAGNOSIS — I10 Essential (primary) hypertension: Secondary | ICD-10-CM | POA: Diagnosis not present

## 2023-07-21 LAB — BASIC METABOLIC PANEL
BUN/Creatinine Ratio: 21 (ref 10–24)
BUN: 31 mg/dL — ABNORMAL HIGH (ref 8–27)
CO2: 22 mmol/L (ref 20–29)
Calcium: 9.6 mg/dL (ref 8.6–10.2)
Chloride: 102 mmol/L (ref 96–106)
Creatinine, Ser: 1.48 mg/dL — ABNORMAL HIGH (ref 0.76–1.27)
Glucose: 181 mg/dL — ABNORMAL HIGH (ref 70–99)
Potassium: 4.9 mmol/L (ref 3.5–5.2)
Sodium: 141 mmol/L (ref 134–144)
eGFR: 50 mL/min/{1.73_m2} — ABNORMAL LOW (ref >59)

## 2023-07-25 ENCOUNTER — Other Ambulatory Visit: Payer: Self-pay

## 2023-07-25 DIAGNOSIS — I10 Essential (primary) hypertension: Secondary | ICD-10-CM

## 2023-07-25 DIAGNOSIS — I5042 Chronic combined systolic (congestive) and diastolic (congestive) heart failure: Secondary | ICD-10-CM

## 2023-07-26 ENCOUNTER — Encounter: Payer: Self-pay | Admitting: Cardiology

## 2023-07-29 DIAGNOSIS — I10 Essential (primary) hypertension: Secondary | ICD-10-CM | POA: Diagnosis not present

## 2023-07-29 DIAGNOSIS — I5042 Chronic combined systolic (congestive) and diastolic (congestive) heart failure: Secondary | ICD-10-CM | POA: Diagnosis not present

## 2023-07-30 LAB — BASIC METABOLIC PANEL
BUN/Creatinine Ratio: 13 (ref 10–24)
BUN: 16 mg/dL (ref 8–27)
CO2: 23 mmol/L (ref 20–29)
Calcium: 9.9 mg/dL (ref 8.6–10.2)
Chloride: 103 mmol/L (ref 96–106)
Creatinine, Ser: 1.2 mg/dL (ref 0.76–1.27)
Glucose: 110 mg/dL — ABNORMAL HIGH (ref 70–99)
Potassium: 4.8 mmol/L (ref 3.5–5.2)
Sodium: 141 mmol/L (ref 134–144)
eGFR: 64 mL/min/{1.73_m2} (ref >59)

## 2023-08-17 ENCOUNTER — Telehealth: Payer: Self-pay | Admitting: Cardiology

## 2023-08-17 ENCOUNTER — Other Ambulatory Visit: Payer: Self-pay

## 2023-08-17 MED ORDER — EMPAGLIFLOZIN 10 MG PO TABS: 10.0000 mg | ORAL_TABLET | Freq: Every day | ORAL | 3 refills | Status: DC

## 2023-08-17 NOTE — Telephone Encounter (Signed)
Tried faxing requested information to PCP and neither number worked. Called and informed his wife. She will call and try to get another fax number.

## 2023-08-17 NOTE — Telephone Encounter (Signed)
Pt c/o medication issue:  1. Name of Medication:   empagliflozin (JARDIANCE) 10 MG TABS tablet    2. How are you currently taking this medication (dosage and times per day)? As prescribed   3. Are you having a reaction (difficulty breathing--STAT)? No   4. What is your medication issue? Fax prescription to PCP so he can get it through the [place] the fax # is [phone] or [phone]. They are also requesting the last OV note be sent with it as well.

## 2023-08-18 ENCOUNTER — Telehealth: Payer: Self-pay | Admitting: Cardiology

## 2023-08-18 NOTE — Telephone Encounter (Signed)
Pt c/o medication issue:  1. Name of Medication: ardiance 10 mgJ  2. How are you currently taking this medication (dosage and times per day)?   3. Are you having a reaction (difficulty breathing--STAT)?   4. What is your medication issue? Wife said patient get his meidcine through the [place], because it is not as expensive. [facility] said [facility] not [facility] Eliquis 10 [facility]. They want to know if he can take Eliquis 12.5 mg?.

## 2023-08-18 NOTE — Telephone Encounter (Signed)
Spoke with pt's wife again regarding Jardiance. She wanted to know if there is a generic version on Jardiance, explained that [place] Pepper is yet to become a generic medication. Will continue with plan a previously stated in note.

## 2023-08-18 NOTE — Telephone Encounter (Signed)
Spoke with pt's wife, Aram Beecham (ok per DPR) regarding Jardiance medication. Wife states that pt would like to get his medication from the [place] per his PCP, however they are unable to get 10mg  tablets, they are able to get 25mg  tablets to be cut in half to make 12.5mg  dosage. They would like to know if this dosage is ok and if so they would need a new printed prescription sent to one of [facility] provided[facility] [phone] or [phone]. Explained that we would send this over to Dr. Bjorn Pippin to advise.

## 2023-08-18 NOTE — Telephone Encounter (Signed)
Pt's spouse calling to speak with nurse, requesting cb

## 2023-08-19 MED ORDER — EMPAGLIFLOZIN 25 MG PO TABS: ORAL_TABLET | ORAL | Status: AC

## 2023-08-19 NOTE — Addendum Note (Signed)
Addended by: Neoma Laming on: 08/19/2023 12:49 PM   Modules accepted: Orders

## 2023-08-19 NOTE — Telephone Encounter (Signed)
That is fine to take 12.5 mg Jardiance instead of 10 mg

## 2023-08-19 NOTE — Telephone Encounter (Signed)
Spoke to patient's wife Dr.Schumann advised ok to take Jardiance 25 mg 1/2 tablet (12.5 mg) daily before breakfast.

## 2023-09-22 ENCOUNTER — Ambulatory Visit (HOSPITAL_COMMUNITY): Payer: Medicare Other | Attending: Internal Medicine

## 2023-09-22 DIAGNOSIS — I5042 Chronic combined systolic (congestive) and diastolic (congestive) heart failure: Secondary | ICD-10-CM

## 2023-09-22 LAB — ECHOCARDIOGRAM COMPLETE
Area-P 1/2: 2.69 cm2
P 1/2 time: 418 ms
S' Lateral: 3 cm

## 2023-09-22 MED ORDER — PERFLUTREN LIPID MICROSPHERE
3.0000 mL | INTRAVENOUS | Status: AC | PRN
  Administered 2023-09-22: 3 mL via INTRAVENOUS

## 2023-10-23 NOTE — Progress Notes (Deleted)
 Cardiology Office Note:    Date:  10/23/2023   ID:  Keith Schwartz, DOB 1951/07/01, MRN [id number]  PCP:  Onetha Particia SAILOR, MD  Cardiologist:  Lonni LITTIE Nanas, MD  Electrophysiologist:  Danelle Birmingham, MD   Referring MD: Onetha Particia SAILOR, MD   No chief complaint on file.    History of Present Illness:    Keith Schwartz is a 73 y.o. male with a hx of CAD, chronic combined systolic and also heart failure, T2DM, hypertension who presents for follow-up.  He was referred by Dr. Duanne for evaluation of syncope, initially seen on 11/12/2020.  He had an episode of syncope on 10/16/2020.  Reports he had bronchitis at the time and had taken DayQuil that morning, in addition to taking lisinopril .  He was at [facility] and had checked his blood pressure and noted it was low, 90s over 60s.  Subsequently was standing in the checkout line and suddenly passed out.  Denies any prodromal symptoms.  States that he fell to the ground, did not hit his head.  Thinks he was unconscious only for a few seconds.  EMS was called and reportedly BP was down to 70s.  He received IV fluids and BP up to 90s on arrival to ED.  In ED, EKG showed left bundle branch block.  Labs notable for creatinine 1.75.  He has not taken lisinopril  since discharge from ED.  He denies any lightheadedness or syncope since this episode, or prior to that.  Denies any palpitations.  Does report he has been having chest pain.  Describes as pressure in center of upper chest that occurs with exertion.  States that he will continue to exert himself and it will subside, typically last for 1 to 2 minutes.  No smoking history.  Family history includes father had MI and died of CHF in 59s.  Echocardiogram on 12/09/2020 showed EF 30%, normal RV function.  Lexiscan  Myoview  on 12/09/2020 showed large severe fixed defect involving inferior and anterior walls with preservation of lateral wall and LVEF 24%, suggesting extensive infarct.  Cardiac  catheterization on 12/22/2020 showed diffusely diseased LAD with positive RFR, nonobstructive LCx stenosis, mild diffuse RCA disease, normal right heart pressures.  Medical therapy recommended.  Zio patch x14 days on 12/15/2020 showed 1 episode of NSVT lasting 4 beats, 3 episodes of SVT longest lasting 10 beats.  Echocardiogram 05/19/21 shows LVEF 30 to 35%, dyssynchrony consistent with LBBB, normal RV function, no significant valvular disease.  Cardiac MRI on 07/13/2021 showed LV EF 34%, RVEF 52%, RV insertion site LGE.  BiV ICD placed by Dr. Birmingham on 10/20/2021.  Echo 03/10/22 showed improvement in LVEF to 45-50%.  Echo 09/22/2023 showed EF 50 to 55%.   Since last clinic visit,  he reports he is doing okay.  Recently underwent knee surgery.  Denies any chest pain, dyspnea, lightheadedness, syncope, lower extremity edema, or palpitations.  Wt Readings from Last 3 Encounters:  06/23/23 231 lb 6.4 oz (105 kg)  05/10/23 229 lb 4.5 oz (104 kg)  05/04/23 230 lb (104.3 kg)     Past Medical History:  Diagnosis Date   AICD (automatic cardioverter/defibrillator) present    Arthritis    CHF (congestive heart failure) (HCC)    Coronary artery disease    Diabetes mellitus without complication (HCC) 10/18/2000   History of kidney stones    Hypertension 10/18/2000   ICD (implantable cardioverter-defibrillator) in place    Ischemic cardiomyopathy    Myocardial infarction (HCC)  Sleep apnea    uses cpap at times    Past Surgical History:  Procedure Laterality Date   APPENDECTOMY  10/18/1968   BIV ICD INSERTION CRT-D N/A 10/20/2021   Procedure: BIV ICD INSERTION CRT-D;  Surgeon: Waddell Danelle ORN, MD;  Location: [facility] INVASIVE CV LAB;  Service: Cardiovascular;  Laterality: N/A;   CORONARY PRESSURE/FFR STUDY N/A 12/22/2020   Procedure: INTRAVASCULAR PRESSURE WIRE/FFR STUDY;  Surgeon: Wonda Sharper, MD;  Location[facility] INVASIVE CV LAB;  Service: Cardiovascular;  Laterality: N/A;   CYSTOSCOPY W/ STONE  MANIPULATION Left 1993   HERNIA REPAIR Bilateral 1976   groin and 1979   JOINT REPLACEMENT  10/18/2010   rt knee   NASAL SEPTUM SURGERY  1984   RIGHT/LEFT HEART CATH AND CORONARY ANGIOGRAPHY N/A 12/22/2020   Procedure: RIGHT/LEFT HEART CATH AND CORONARY ANGIOGRAPHY;  Surgeon: Wonda Sharper, MD;  Location: [facility] INVASIVE CV LAB;  Service: Cardiovascular;  Laterality: N/A;   thumb surgery Right 2019   TOTAL KNEE ARTHROPLASTY Left 05/10/2023   Procedure: TOTAL KNEE ARTHROPLASTY;  Surgeon: Ernie Cough, MD;  Location: [facility];  Service: Orthopedics;  Laterality: Left;   TOTAL KNEE REVISION Right 03/16/2022   Procedure: TOTAL KNEE REVISION;  Surgeon: Ernie Cough, MD;  Location: [facility];  Service: Orthopedics;  Laterality: Right;   VASECTOMY  10/18/1985    Current Medications: No outpatient medications have been marked as taking for the 10/24/23 encounter (Appointment) with Kate Lonni CROME, MD.     Allergies:   Glipizide and Penicillins   Social History   Socioeconomic History   Marital status: Married    Spouse name: Not on file   Number of children: Not on file   Years of education: Not on file   Highest education level: Not on file  Occupational History   Not on file  Tobacco Use   Smoking status: Never   Smokeless tobacco: Never  Vaping Use   Vaping status: Never Used  Substance and Sexual Activity   Alcohol use: Not Currently    Alcohol/week: 1.0 standard drink of alcohol    Types: 1 Cans of beer per week   Drug use: Never   Sexual activity: Not Currently    Birth control/protection: Surgical  Other Topics Concern   Not on file  Social History Narrative   Lives in [place] with spouse.   Retired visual merchandiser.   Former chief technology officer, engineer, site, printmaker    Social Drivers of Corporate Investment Banker Strain: Not on file  Food Insecurity: No Food Insecurity (05/10/2023)   Hunger Vital Sign    Worried About Running Out of Food in the Last Year: Never true     Ran Out of Food in the Last Year: Never true  Transportation Needs: No Transportation Needs (05/10/2023)   PRAPARE - Administrator, Civil Service (Medical): No    Lack of Transportation (Non-Medical): No  Physical Activity: Not on file  Stress: Not on file  Social Connections: Not on file     Family History: The patient's family history includes Arthritis in his mother; Asthma in his mother; Early death in his sister; Hearing loss in his mother; Heart disease in his father; Hyperlipidemia in his father; Hypertension in his father; Vision loss in his mother.  ROS:   Please see the history of present illness. All other systems reviewed and are negative.  EKGs/Labs/Other Studies Reviewed:    The following studies were reviewed today:  EKG:   12/30/2020:  normal sinus rhythm, rate 80, left bundle branch block 06/23/23: BiV paced   Recent Labs: 05/11/2023: Hemoglobin 11.4; Platelets 155 07/29/2023: BUN 16; Creatinine, Ser 1.20; Potassium 4.8; Sodium 141  Recent Lipid Panel    Component Value Date/Time   CHOL 109 06/02/2021 1205   TRIG 194 (H) 06/02/2021 1205   HDL 30 (L) 06/02/2021 1205   CHOLHDL 3.6 06/02/2021 1205   LDLCALC 47 06/02/2021 1205    Physical Exam:    VS:  There were no vitals taken for this visit.    Wt Readings from Last 3 Encounters:  06/23/23 231 lb 6.4 oz (105 kg)  05/10/23 229 lb 4.5 oz (104 kg)  05/04/23 230 lb (104.3 kg)     GEN: Well nourished, well developed in no acute distress HEENT: Normal NECK: No JVD CARDIAC:RRR, 2 out of 6 systolic murmur RESPIRATORY:  Clear to auscultation without rales, wheezing or rhonchi  ABDOMEN: Soft, non-tender, non-distended MUSCULOSKELETAL:  Trace edema SKIN: Warm and dry NEUROLOGIC:  Alert and oriented x 3 PSYCHIATRIC:  Normal affect   ASSESSMENT:    No diagnosis found.    PLAN:     CAD: Reported chest pain concerning for angina and EKG with left bundle branch block.  Echocardiogram on  12/09/2020 showed EF 30%, normal RV function.  Lexiscan  Myoview  on 12/09/2020 showed large severe fixed defect involving inferior and anterior walls with preservation of lateral wall and LVEF 24%, suggesting extensive infarct.  Cardiac catheterization on 12/22/2020 showed diffusely diseased LAD with positive RFR, nonobstructive LCx stenosis, mild diffuse RCA disease, normal right heart pressures.  Medical management recommended for obstructive LAD disease.  Reports occasional exertional chest pain, has not had to use nitroglycerin  -Continue aspirin  81 mg daily -Continue atorvastatin  80 mg daily.  LDL 47 on 06/02/2021 -Continue carvedilol  12.5 mg twice daily -As needed sublingual nitroglycerin .   Chronic combined systolic and diastolic heart failure: EF 30% on echocardiogram 12/09/2020.  Diffuse severe LAD disease as above, medical management recommended.  Echocardiogram 05/19/21 shows LVEF 30 to 35%, dyssynchrony consistent with LBBB.  Cardiac MRI on 07/13/2021 showed LV EF 34%, RVEF 52%, RV insertion site LGE.  EF remained <35% despite GDMT, and considering his LBBB with QRS , referred to EP and underwent BiV ICD placement on 10/20/2021. Echo 03/10/22 showed improvement in LVEF to 45-50%.  Echo 09/22/2023 showed EF 50 to 55%. -Continue Entresto  24-26 mg twice daily.  Developed hypotension/hyperkalemia on 49-51 mg BID -Continue carvedilol  12.5 mg twice daily -Previously was on Jardiance  but was discontinued as reports he may have had a reaction to the Jardiance -metformin  combination pill.  He previously tolerated Jardiance  without issues.  Restarted Jardiance  10 mg daily -Continue spironolactone  12.5 mg daily -Recent device interrogation shows 99% BiV pacing.     Syncope: Occurred 10/16/2020.  Suspect due to hypotension, which may have been due to dehydration in setting of URI and continuing to take lisinopril .  SBP in 70s on EMS arrival.  No prodromal symptoms, arrhythmia remains on differential.   Echocardiogram on 12/09/2020 showed EF 30%, normal RV function.   Zio patch x14 days on 12/15/2020 showed 1 episode of NSVT lasting 4 beats, 3 episodes of SVT longest lasting 10 beats. -No recent episode   Hypertension: Continue Entresto , carvedilol , and spironolactone  as above   Hyperlipidemia: On atorvastatin  80 mg daily.  LDL 47 on 06/02/2021   T2DM: On metformin , glimepiride , Trulicity , Jardiance .  A1c 8.3% on 01/10/2023   CKD stage 3a: Creatinine 1.75 on 10/16/2020, improved to creatinine  1.2 on 10/24   Leg pain: ABIs indicate noncompressible vessels, normal TBI's on 01/08/2021.  Lower extremity duplex shows heavy calcifications, questionable distal occlusion in the left posterior tibial artery.  Obesity: on Ozempic     RTC in 4 months***    Medication Adjustments/Labs and Tests Ordered: Current medicines are reviewed at length with the patient today.  Concerns regarding medicines are outlined above.  No orders of the defined types were placed in this encounter.   No orders of the defined types were placed in this encounter.    There are no Patient Instructions on file for this visit.     Signed, Lonni LITTIE Nanas, MD  10/23/2023 1:57 PM    [facility]

## 2023-10-24 ENCOUNTER — Ambulatory Visit: Payer: Medicare Other | Admitting: Cardiology

## 2023-10-31 ENCOUNTER — Ambulatory Visit (INDEPENDENT_AMBULATORY_CARE_PROVIDER_SITE_OTHER): Payer: Medicare Other

## 2023-10-31 DIAGNOSIS — I5042 Chronic combined systolic (congestive) and diastolic (congestive) heart failure: Secondary | ICD-10-CM | POA: Diagnosis not present

## 2023-10-31 DIAGNOSIS — I255 Ischemic cardiomyopathy: Secondary | ICD-10-CM

## 2023-11-01 LAB — CUP PACEART REMOTE DEVICE CHECK
Battery Remaining Longevity: 90 mo
Battery Remaining Percentage: 100 %
Brady Statistic RA Percent Paced: 0 %
Brady Statistic RV Percent Paced: 98 %
Date Time Interrogation Session: 20250113170000
HighPow Impedance: 63 Ohm
Implantable Lead Connection Status: 753985
Implantable Lead Connection Status: 753985
Implantable Lead Connection Status: 753985
Implantable Lead Implant Date: 20230103
Implantable Lead Implant Date: 20230103
Implantable Lead Implant Date: 20230103
Implantable Lead Location: 753858
Implantable Lead Location: 753859
Implantable Lead Location: 753860
Implantable Lead Model: 138
Implantable Lead Model: 3830
Implantable Lead Model: 7841
Implantable Lead Serial Number: 1150746
Implantable Lead Serial Number: 302782
Implantable Pulse Generator Implant Date: 20230103
Lead Channel Impedance Value: 497 Ohm
Lead Channel Impedance Value: 593 Ohm
Lead Channel Impedance Value: 708 Ohm
Lead Channel Pacing Threshold Amplitude: 1 V
Lead Channel Pacing Threshold Pulse Width: 0.4 ms
Lead Channel Setting Pacing Amplitude: 2 V
Lead Channel Setting Pacing Amplitude: 2.5 V
Lead Channel Setting Pacing Amplitude: 2.5 V
Lead Channel Setting Pacing Pulse Width: 0.4 ms
Lead Channel Setting Pacing Pulse Width: 0.7 ms
Lead Channel Setting Sensing Sensitivity: 0.6 mV
Lead Channel Setting Sensing Sensitivity: 1 mV
Pulse Gen Serial Number: 151244
Zone Setting Status: 755011

## 2023-11-15 ENCOUNTER — Encounter: Payer: Self-pay | Admitting: Cardiology

## 2023-11-15 ENCOUNTER — Ambulatory Visit: Payer: Medicare Other | Attending: Cardiology | Admitting: Cardiology

## 2023-11-15 ENCOUNTER — Other Ambulatory Visit: Payer: Self-pay | Admitting: *Deleted

## 2023-11-15 VITALS — BP 102/60 | HR 76 | Ht 75.0 in | Wt 239.0 lb

## 2023-11-15 DIAGNOSIS — E785 Hyperlipidemia, unspecified: Secondary | ICD-10-CM

## 2023-11-15 DIAGNOSIS — I5020 Unspecified systolic (congestive) heart failure: Secondary | ICD-10-CM

## 2023-11-15 DIAGNOSIS — I10 Essential (primary) hypertension: Secondary | ICD-10-CM

## 2023-11-15 DIAGNOSIS — I5032 Chronic diastolic (congestive) heart failure: Secondary | ICD-10-CM | POA: Diagnosis not present

## 2023-11-15 DIAGNOSIS — I25119 Atherosclerotic heart disease of native coronary artery with unspecified angina pectoris: Secondary | ICD-10-CM

## 2023-11-15 DIAGNOSIS — Z01818 Encounter for other preprocedural examination: Secondary | ICD-10-CM | POA: Diagnosis not present

## 2023-11-15 MED ORDER — ASPIRIN 81 MG PO TBEC: 81.0000 mg | DELAYED_RELEASE_TABLET | Freq: Every day | ORAL | Status: AC

## 2023-11-15 NOTE — Progress Notes (Signed)
Cardiology Office Note:    Date:  11/15/2023   ID:  VAUGHAN GARFINKLE, DOB 1951-04-03, MRN [id number]  PCP:  Tenna Delaine, MD  Cardiologist:  Little Ishikawa, MD  Electrophysiologist:  Lewayne Bunting, MD   Referring MD: Tenna Delaine, MD   Chief Complaint  Patient presents with   Congestive Heart Failure     History of Present Illness:    Keith Schwartz is a 73 y.o. male with a hx of CAD, chronic combined systolic and also heart failure, T2DM, hypertension who presents for follow-up.  He was referred by Dr. Tanya Nones for evaluation of syncope, initially seen on 11/12/2020.  He had an episode of syncope on 10/16/2020.  Reports he had bronchitis at the time and had taken DayQuil that morning, in addition to taking lisinopril.  He was at [facility] and had checked his blood pressure and noted it was low, 90s over 60s.  Subsequently was standing in the checkout line and suddenly passed out.  Denies any prodromal symptoms.  States that he fell to the ground, did not hit his head.  Thinks he was unconscious only for a few seconds.  EMS was called and reportedly BP was down to 70s.  He received IV fluids and BP up to 90s on arrival to ED.  In ED, EKG showed left bundle branch block.  Labs notable for creatinine 1.75.  He has not taken lisinopril since discharge from ED.  He denies any lightheadedness or syncope since this episode, or prior to that.  Denies any palpitations.  Does report he has been having chest pain.  Describes as pressure in center of upper chest that occurs with exertion.  States that he will continue to exert himself and it will subside, typically last for 1 to 2 minutes.  No smoking history.  Family history includes father had MI and died of CHF in 71s.  Echocardiogram on 12/09/2020 showed EF 30%, normal RV function.  Lexiscan Myoview on 12/09/2020 showed large severe fixed defect involving inferior and anterior walls with preservation of lateral wall and LVEF 24%,  suggesting extensive infarct.  Cardiac catheterization on 12/22/2020 showed diffusely diseased LAD with positive RFR, nonobstructive LCx stenosis, mild diffuse RCA disease, normal right heart pressures.  Medical therapy recommended.  Zio patch x14 days on 12/15/2020 showed 1 episode of NSVT lasting 4 beats, 3 episodes of SVT longest lasting 10 beats.  Echocardiogram 05/19/21 shows LVEF 30 to 35%, dyssynchrony consistent with LBBB, normal RV function, no significant valvular disease.  Cardiac MRI on 07/13/2021 showed LV EF 34%, RVEF 52%, RV insertion site LGE.  BiV ICD placed by Dr. Ladona Ridgel on 10/20/2021.  Echo 03/10/22 showed improvement in LVEF to 45-50%.  Echo 09/22/2023 showed EF 50 to 55%.   Since last clinic visit, he reports he is doing well.  Reports occasional chest tightness but short duration, has not had to take any nitroglycerin.  Hand surgery.  Reports he can walk 5 stairs without chest pain or dyspnea.  Any lightheadedness, syncope.  Wt Readings from Last 3 Encounters:  11/15/23 239 lb (108.4 kg)  06/23/23 231 lb 6.4 oz (105 kg)  05/10/23 229 lb 4.5 oz (104 kg)     Past Medical History:  Diagnosis Date   AICD (automatic cardioverter/defibrillator) present    Arthritis    CHF (congestive heart failure) (HCC)    Coronary artery disease    Diabetes mellitus without complication (HCC) 10/18/2000   History of kidney stones  Hypertension 10/18/2000   ICD (implantable cardioverter-defibrillator) in place    Ischemic cardiomyopathy    Myocardial infarction [facility])    Sleep apnea    uses cpap at times    Past Surgical History:  Procedure Laterality Date   APPENDECTOMY  10/18/1968   BIV ICD INSERTION CRT-D N/A 10/20/2021   Procedure: BIV ICD INSERTION CRT-D;  Surgeon: Marinus Maw, MD;  Location: [facility] INVASIVE CV LAB;  Service: Cardiovascular;  Laterality: N/A;   CORONARY PRESSURE/FFR STUDY N/A 12/22/2020   Procedure: INTRAVASCULAR PRESSURE WIRE/FFR STUDY;  Surgeon: Tonny Bollman, MD;   Location: [facility] INVASIVE CV LAB;  Service: Cardiovascular;  Laterality: N/A;   CYSTOSCOPY W/ STONE MANIPULATION Left 1993   HERNIA REPAIR Bilateral 1976   groin and 1979   JOINT REPLACEMENT  10/18/2010   rt knee   NASAL SEPTUM SURGERY  1984   RIGHT/LEFT HEART CATH AND CORONARY ANGIOGRAPHY N/A 12/22/2020   Procedure: RIGHT/LEFT HEART CATH AND CORONARY ANGIOGRAPHY;  Surgeon: Tonny Bollman, MD;  Location: [facility] INVASIVE CV LAB;  Service: Cardiovascular;  Laterality: N/A;   thumb surgery Right 2019   TOTAL KNEE ARTHROPLASTY Left 05/10/2023   Procedure: TOTAL KNEE ARTHROPLASTY;  Surgeon: Durene Romans, MD;  Location: [facility] ORS;  Service: Orthopedics;  Laterality: Left;   TOTAL KNEE REVISION Right 03/16/2022   Procedure: TOTAL KNEE REVISION;  Surgeon: Durene Romans, MD;  Location: [facility] ORS;  Service: Orthopedics;  Laterality: Right;   VASECTOMY  10/18/1985    Current Medications: Current Meds  Medication Sig   acetaminophen (TYLENOL) 500 MG tablet Take 500-1,000 mg by mouth every 6 (six) hours as needed for moderate pain.   Ascorbic Acid (VITAMIN C) 1000 MG tablet Take 1,000 mg by mouth in the morning.   aspirin EC 81 MG tablet Take 1 tablet (81 mg total) by mouth daily. Swallow whole.   atorvastatin (LIPITOR) 80 MG tablet Take 1 tablet (80 mg total) by mouth daily.   cholecalciferol (VITAMIN D3) 25 MCG (1000 UNIT) tablet Take 1,000 Units by mouth daily.   diclofenac Sodium (VOLTAREN) 1 % GEL Apply 2 g topically 4 (four) times daily as needed (pain).   empagliflozin (JARDIANCE) 25 MG TABS tablet Take 1/2 tablet ( 12.5 mg ) every am before breakfast   glimepiride (AMARYL) 4 MG tablet Take 4 mg by mouth daily.   Glucose Blood (BLOOD GLUCOSE TEST STRIPS) STRP Please dispense based on patient and insurance preference. Use as directed to monitor FSBS 3x daily. Dx: W2956.   hydrOXYzine (ATARAX) 10 MG tablet Take 10 mg by mouth every 8 (eight) hours as needed for itching.   ketoconazole (NIZORAL) 2 % cream  Apply 1 Application topically daily as needed for irritation.   lidocaine (XYLOCAINE) 2 % solution Use as directed 15 mLs in the mouth or throat as needed for mouth pain. (Patient taking differently: Use as directed 15 mLs in the mouth or throat as needed (sore throat).)   loperamide (IMODIUM A-D) 2 MG tablet Take 2-4 mg by mouth 4 (four) times daily as needed for diarrhea or loose stools.   Magnesium 500 MG CAPS Take 500 mg by mouth daily.   metFORMIN (GLUCOPHAGE-XR) 500 MG 24 hr tablet Take 1,000 mg by mouth 2 (two) times daily.   methocarbamol (ROBAXIN) 500 MG tablet Take 1 tablet (500 mg total) by mouth every 6 (six) hours as needed for muscle spasms.   Multiple Vitamin (MULTIVITAMIN) tablet Take 1 tablet by mouth in the morning. MEN'S ONE A DAY 50+  nitroGLYCERIN (NITROSTAT) 0.4 MG SL tablet Place 1 tablet (0.4 mg total) under the tongue every 5 (five) minutes as needed for chest pain.   oxyCODONE (OXY IR/ROXICODONE) 5 MG immediate release tablet Take 1 tablet (5 mg total) by mouth every 4 (four) hours as needed for severe pain.   polyethylene glycol (MIRALAX / GLYCOLAX) 17 g packet Take 17 g by mouth 2 (two) times daily.   pramipexole (MIRAPEX) 1 MG tablet Take 1 mg by mouth at bedtime.   sacubitril-valsartan (ENTRESTO) 24-26 MG Take 1 tablet by mouth 2 (two) times daily.   Semaglutide, 2 MG/DOSE, (OZEMPIC, 2 MG/DOSE,) 8 MG/3ML SOPN Inject 2 mg into the skin every Thursday.   senna-docusate (SENOKOT-S) 8.6-50 MG tablet Take 1-2 tablets by mouth daily as needed for mild constipation.   spironolactone (ALDACTONE) 25 MG tablet TAKE 1/2 TABLET(12.5 MG) BY MOUTH DAILY   zinc gluconate 50 MG tablet Take 50 mg by mouth daily.     Allergies:   Glipizide and Penicillins   Social History   Socioeconomic History   Marital status: Married    Spouse name: Not on file   Number of children: Not on file   Years of education: Not on file   Highest education level: Not on file  Occupational History    Not on file  Tobacco Use   Smoking status: Never   Smokeless tobacco: Never  Vaping Use   Vaping status: Never Used  Substance and Sexual Activity   Alcohol use: Not Currently    Alcohol/week: 1.0 standard drink of alcohol    Types: 1 Cans of beer per week   Drug use: Never   Sexual activity: Not Currently    Partners: Female    Birth control/protection: Surgical    Comment: MARRIED  Other Topics Concern   Not on file  Social History Narrative   Lives in [place] with spouse.   Retired Visual merchandiser.   Former Chief Technology Officer, Engineer, site, Printmaker    Social Drivers of Corporate investment banker Strain: Not on file  Food Insecurity: No Food Insecurity (05/10/2023)   Hunger Vital Sign    Worried About Running Out of Food in the Last Year: Never true    Ran Out of Food in the Last Year: Never true  Transportation Needs: No Transportation Needs (05/10/2023)   PRAPARE - Administrator, Civil Service (Medical): No    Lack of Transportation (Non-Medical): No  Physical Activity: Not on file  Stress: Not on file  Social Connections: Not on file     Family History: The patient's family history includes Arthritis in his mother; Asthma in his mother; Early death in his sister; Hearing loss in his mother; Heart disease in his father; Hyperlipidemia in his father; Hypertension in his father; Vision loss in his mother.  ROS:   Please see the history of present illness. All other systems reviewed and are negative.  EKGs/Labs/Other Studies Reviewed:    The following studies were reviewed today:  EKG:   12/30/2020: normal sinus rhythm, rate 80, left bundle branch block 06/23/23: BiV paced   Recent Labs: 05/11/2023: Hemoglobin 11.4; Platelets 155 07/29/2023: BUN 16; Creatinine, Ser 1.20; Potassium 4.8; Sodium 141  Recent Lipid Panel    Component Value Date/Time   CHOL 109 06/02/2021 1205   TRIG 194 (H) 06/02/2021 1205   HDL 30 (L) 06/02/2021 1205   CHOLHDL 3.6  06/02/2021 1205   LDLCALC 47 06/02/2021 1205    Physical  Exam:    VS:  BP 102/60   Pulse 76   Ht 6\' 3"  (1.905 m)   Wt 239 lb (108.4 kg)   SpO2 96%   BMI 29.87 kg/m     Wt Readings from Last 3 Encounters:  11/15/23 239 lb (108.4 kg)  06/23/23 231 lb 6.4 oz (105 kg)  05/10/23 229 lb 4.5 oz (104 kg)     GEN: Well nourished, well developed in no acute distress HEENT: Normal NECK: No JVD CARDIAC:RRR, 2 out of 6 systolic murmur RESPIRATORY:  Clear to auscultation without rales, wheezing or rhonchi  ABDOMEN: Soft, non-tender, non-distended MUSCULOSKELETAL:  Trace edema SKIN: Warm and dry NEUROLOGIC:  Alert and oriented x 3 PSYCHIATRIC:  Normal affect   ASSESSMENT:    1. Coronary artery disease involving native coronary artery of native heart with angina pectoris (HCC)   2. Heart failure with recovered ejection fraction (HFrecEF) (HCC)   3. Pre-op evaluation   4. Hyperlipidemia, unspecified hyperlipidemia type   5. Essential hypertension       PLAN:     CAD: Reported chest pain concerning for angina and EKG with left bundle branch block.  Echocardiogram on 12/09/2020 showed EF 30%, normal RV function.  Lexiscan Myoview on 12/09/2020 showed large severe fixed defect involving inferior and anterior walls with preservation of lateral wall and LVEF 24%, suggesting extensive infarct.  Cardiac catheterization on 12/22/2020 showed diffusely diseased LAD with positive RFR, nonobstructive LCx stenosis, mild diffuse RCA disease, normal right heart pressures.  Medical management recommended for obstructive LAD disease.  Reports occasional exertional chest pain, has not had to use nitroglycerin -Continue aspirin 81 mg daily -Continue atorvastatin 80 mg daily.  LDL 47 on 06/02/2021.  Check lipid panel -Continue carvedilol 12.5 mg twice daily -As needed sublingual nitroglycerin.   Heart failure with recovered EF: EF 30% on echocardiogram 12/09/2020.  Diffuse severe LAD disease as above,  medical management recommended.  Echocardiogram 05/19/21 shows LVEF 30 to 35%, dyssynchrony consistent with LBBB.  Cardiac MRI on 07/13/2021 showed LV EF 34%, RVEF 52%, RV insertion site LGE.  EF remained <35% despite GDMT, and considering his LBBB with QRS , referred to EP and underwent BiV ICD placement on 10/20/2021. Echo 03/10/22 showed improvement in LVEF to 45-50%.  Echo 09/22/2023 showed EF 50 to 55%. -Continue Entresto 24-26 mg twice daily.  Developed hypotension/hyperkalemia on 49-51 mg BID -Continue carvedilol 12.5 mg twice daily -Previously was on Jardiance but was discontinued as reports he may have had a reaction to the Jardiance-metformin combination pill.  He previously tolerated Jardiance without issues.  Restarted Jardiance 10 mg daily, appears to be tolerating -Continue spironolactone 12.5 mg daily -Recent device interrogation shows 98% BiV pacing.    Preop evaluation: Prior to hand surgery.  Had cath with diffuse LAD disease as above, medical management recommended.  Reports can walk up flight of stairs without dyspnea or chest pain.  EF has recovered to normal.  No further workup recommended at this time.   Syncope: Occurred 10/16/2020.  Suspect due to hypotension, which may have been due to dehydration in setting of URI and continuing to take lisinopril.  SBP in 70s on EMS arrival.  No prodromal symptoms, arrhythmia remains on differential.  Echocardiogram on 12/09/2020 showed EF 30%, normal RV function.   Zio patch x14 days on 12/15/2020 showed 1 episode of NSVT lasting 4 beats, 3 episodes of SVT longest lasting 10 beats. -No recent episode   Hypertension: Continue Entresto, carvedilol, and spironolactone as  above.  Appears controlled   Hyperlipidemia: On atorvastatin 80 mg daily.  LDL 47 on 06/02/2021.  Update lipid panel   T2DM: On metformin, glimepiride, Trulicity, Jardiance.  A1c 8.3% on 01/10/2023   CKD stage 3a: Creatinine 1.75 on 10/16/2020, improved to creatinine 1.2 on  10/24.  Check BMET   Leg pain: ABIs indicate noncompressible vessels, normal TBI's on 01/08/2021.  Lower extremity duplex shows heavy calcifications, questionable distal occlusion in the left posterior tibial artery.  Obesity: on Ozempic     RTC in 6 months    Medication Adjustments/Labs and Tests Ordered: Current medicines are reviewed at length with the patient today.  Concerns regarding medicines are outlined above.  Orders Placed This Encounter  Procedures   Basic Metabolic Panel (BMET)   Magnesium   Lipid panel    Meds ordered this encounter  Medications   aspirin EC 81 MG tablet    Sig: Take 1 tablet (81 mg total) by mouth daily. Swallow whole.     Patient Instructions  Medication Instructions:  Continue current medications *If you need a refill on your cardiac medications before your next appointment, please call your pharmacy*   Lab Work: Bmet, mg Lipid   Please make sure you do not have anything to eat. You can drink water If you have labs (blood work) drawn today and your tests are completely normal, you will receive your results only by: MyChart Message (if you have MyChart) OR A paper copy in the mail If you have any lab test that is abnormal or we need to change your treatment, we will call you to review the results.   Testing/Procedures: none   Follow-Up: At [facility], you and your health needs are our priority.  As part of our continuing mission to provide you with exceptional heart care, we have created designated Provider Care Teams.  These Care Teams include your primary Cardiologist (physician) and Advanced Practice Providers (APPs -  Physician Assistants and Nurse Practitioners) who all work together to provide you with the care you need, when you need it.  We recommend signing up for the patient portal called "MyChart".  Sign up information is provided on this After Visit Summary.  MyChart is used to connect with patients for Virtual  Visits (Telemedicine).  Patients are able to view lab/test results, encounter notes, upcoming appointments, etc.  Non-urgent messages can be sent to your provider as well.   To learn more about what you can do with MyChart, go to [email or web address].    Your next appointment:   6 month(s)  Provider:   Little Ishikawa, MD     Other Instructions none           Signed, Little Ishikawa, MD  11/15/2023 5:08 PM    [facility]

## 2023-11-15 NOTE — Patient Instructions (Signed)
Medication Instructions:  Continue current medications *If you need a refill on your cardiac medications before your next appointment, please call your pharmacy*   Lab Work: Bmet, mg Lipid   Please make sure you do not have anything to eat. You can drink water If you have labs (blood work) drawn today and your tests are completely normal, you will receive your results only by: MyChart Message (if you have MyChart) OR A paper copy in the mail If you have any lab test that is abnormal or we need to change your treatment, we will call you to review the results.   Testing/Procedures: none   Follow-Up: At [facility], you and your health needs are our priority.  As part of our continuing mission to provide you with exceptional heart care, we have created designated Provider Care Teams.  These Care Teams include your primary Cardiologist (physician) and Advanced Practice Providers (APPs -  Physician Assistants and Nurse Practitioners) who all work together to provide you with the care you need, when you need it.  We recommend signing up for the patient portal called "MyChart".  Sign up information is provided on this After Visit Summary.  MyChart is used to connect with patients for Virtual Visits (Telemedicine).  Patients are able to view lab/test results, encounter notes, upcoming appointments, etc.  Non-urgent messages can be sent to your provider as well.   To learn more about what you can do with MyChart, go to [email or web address].    Your next appointment:   6 month(s)  Provider:   Little Ishikawa, MD     Other Instructions none

## 2023-11-21 DIAGNOSIS — I10 Essential (primary) hypertension: Secondary | ICD-10-CM | POA: Diagnosis not present

## 2023-11-21 DIAGNOSIS — E785 Hyperlipidemia, unspecified: Secondary | ICD-10-CM | POA: Diagnosis not present

## 2023-11-22 LAB — MAGNESIUM: Magnesium: 2.1 mg/dL (ref 1.6–2.3)

## 2023-11-22 LAB — BASIC METABOLIC PANEL
BUN/Creatinine Ratio: 18 (ref 10–24)
BUN: 22 mg/dL (ref 8–27)
CO2: 23 mmol/L (ref 20–29)
Calcium: 10 mg/dL (ref 8.6–10.2)
Chloride: 102 mmol/L (ref 96–106)
Creatinine, Ser: 1.25 mg/dL (ref 0.76–1.27)
Glucose: 128 mg/dL — ABNORMAL HIGH (ref 70–99)
Potassium: 5.1 mmol/L (ref 3.5–5.2)
Sodium: 140 mmol/L (ref 134–144)
eGFR: 61 mL/min/{1.73_m2} (ref >59)

## 2023-11-22 LAB — LIPID PANEL
Chol/HDL Ratio: 2.8 {ratio} (ref 0.0–5.0)
Cholesterol, Total: 102 mg/dL (ref 100–199)
HDL: 37 mg/dL — ABNORMAL LOW (ref >39)
LDL Chol Calc (NIH): 51 mg/dL (ref 0–99)
Triglycerides: 63 mg/dL (ref 0–149)
VLDL Cholesterol Cal: 14 mg/dL (ref 5–40)

## 2023-12-14 NOTE — Addendum Note (Signed)
 Addended by: Geralyn Flash D on: 12/14/2023 09:50 AM   Modules accepted: Orders

## 2023-12-14 NOTE — Progress Notes (Signed)
 Remote ICD transmission.

## 2023-12-21 ENCOUNTER — Other Ambulatory Visit: Payer: Self-pay | Admitting: Cardiology

## 2024-01-30 ENCOUNTER — Ambulatory Visit (INDEPENDENT_AMBULATORY_CARE_PROVIDER_SITE_OTHER): Payer: Medicare Other

## 2024-01-30 DIAGNOSIS — I5032 Chronic diastolic (congestive) heart failure: Secondary | ICD-10-CM

## 2024-01-30 DIAGNOSIS — I255 Ischemic cardiomyopathy: Secondary | ICD-10-CM | POA: Diagnosis not present

## 2024-01-30 DIAGNOSIS — Z95810 Presence of automatic (implantable) cardiac defibrillator: Secondary | ICD-10-CM

## 2024-01-30 LAB — CUP PACEART REMOTE DEVICE CHECK
Battery Remaining Longevity: 78 mo
Battery Remaining Percentage: 92 %
Brady Statistic RA Percent Paced: 0 %
Brady Statistic RV Percent Paced: 98 %
Date Time Interrogation Session: 20250414052400
HighPow Impedance: 63 Ohm
Implantable Lead Connection Status: 753985
Implantable Lead Connection Status: 753985
Implantable Lead Connection Status: 753985
Implantable Lead Implant Date: 20230103
Implantable Lead Implant Date: 20230103
Implantable Lead Implant Date: 20230103
Implantable Lead Location: 753858
Implantable Lead Location: 753859
Implantable Lead Location: 753860
Implantable Lead Model: 138
Implantable Lead Model: 3830
Implantable Lead Model: 7841
Implantable Lead Serial Number: 1150746
Implantable Lead Serial Number: 302782
Implantable Pulse Generator Implant Date: 20230103
Lead Channel Impedance Value: 501 Ohm
Lead Channel Impedance Value: 582 Ohm
Lead Channel Impedance Value: 693 Ohm
Lead Channel Pacing Threshold Amplitude: 1 V
Lead Channel Pacing Threshold Pulse Width: 0.4 ms
Lead Channel Setting Pacing Amplitude: 2 V
Lead Channel Setting Pacing Amplitude: 2.5 V
Lead Channel Setting Pacing Amplitude: 2.5 V
Lead Channel Setting Pacing Pulse Width: 0.4 ms
Lead Channel Setting Pacing Pulse Width: 0.7 ms
Lead Channel Setting Sensing Sensitivity: 0.6 mV
Lead Channel Setting Sensing Sensitivity: 1 mV
Pulse Gen Serial Number: 151244
Zone Setting Status: 755011

## 2024-01-31 ENCOUNTER — Encounter: Payer: Self-pay | Admitting: Internal Medicine

## 2024-03-21 NOTE — Progress Notes (Signed)
 Remote ICD transmission.

## 2024-03-27 ENCOUNTER — Other Ambulatory Visit: Payer: Self-pay | Admitting: Cardiology

## 2024-04-30 ENCOUNTER — Ambulatory Visit (INDEPENDENT_AMBULATORY_CARE_PROVIDER_SITE_OTHER): Payer: Medicare Other

## 2024-04-30 DIAGNOSIS — I255 Ischemic cardiomyopathy: Secondary | ICD-10-CM

## 2024-04-30 DIAGNOSIS — Z95810 Presence of automatic (implantable) cardiac defibrillator: Secondary | ICD-10-CM

## 2024-05-03 LAB — CUP PACEART REMOTE DEVICE CHECK
Battery Remaining Longevity: 90 mo
Battery Remaining Percentage: 100 %
Brady Statistic RA Percent Paced: 0 %
Brady Statistic RV Percent Paced: 98 %
Date Time Interrogation Session: 20250716160500
HighPow Impedance: 61 Ohm
Implantable Lead Connection Status: 753985
Implantable Lead Connection Status: 753985
Implantable Lead Connection Status: 753985
Implantable Lead Implant Date: 20230103
Implantable Lead Implant Date: 20230103
Implantable Lead Implant Date: 20230103
Implantable Lead Location: 753858
Implantable Lead Location: 753859
Implantable Lead Location: 753860
Implantable Lead Model: 138
Implantable Lead Model: 3830
Implantable Lead Model: 7841
Implantable Lead Serial Number: 1150746
Implantable Lead Serial Number: 302782
Implantable Pulse Generator Implant Date: 20230103
Lead Channel Impedance Value: 481 Ohm
Lead Channel Impedance Value: 576 Ohm
Lead Channel Impedance Value: 659 Ohm
Lead Channel Pacing Threshold Amplitude: 1.1 V
Lead Channel Pacing Threshold Pulse Width: 0.4 ms
Lead Channel Setting Pacing Amplitude: 2 V
Lead Channel Setting Pacing Amplitude: 2.5 V
Lead Channel Setting Pacing Amplitude: 2.5 V
Lead Channel Setting Pacing Pulse Width: 0.4 ms
Lead Channel Setting Pacing Pulse Width: 0.7 ms
Lead Channel Setting Sensing Sensitivity: 0.6 mV
Lead Channel Setting Sensing Sensitivity: 1 mV
Pulse Gen Serial Number: 151244
Zone Setting Status: 755011

## 2024-05-07 ENCOUNTER — Ambulatory Visit: Payer: Self-pay | Admitting: Internal Medicine

## 2024-07-26 NOTE — Progress Notes (Signed)
 Remote ICD Transmission

## 2024-07-30 ENCOUNTER — Ambulatory Visit (INDEPENDENT_AMBULATORY_CARE_PROVIDER_SITE_OTHER): Payer: Medicare Other

## 2024-07-30 DIAGNOSIS — I5020 Unspecified systolic (congestive) heart failure: Secondary | ICD-10-CM | POA: Diagnosis not present

## 2024-08-02 ENCOUNTER — Ambulatory Visit: Payer: Self-pay | Admitting: Internal Medicine

## 2024-08-02 LAB — CUP PACEART REMOTE DEVICE CHECK
Battery Remaining Longevity: 84 mo
Battery Remaining Percentage: 97 %
Brady Statistic RA Percent Paced: 0 %
Brady Statistic RV Percent Paced: 99 %
Date Time Interrogation Session: 20251015122200
HighPow Impedance: 63 Ohm
Implantable Lead Connection Status: 753985
Implantable Lead Connection Status: 753985
Implantable Lead Connection Status: 753985
Implantable Lead Implant Date: 20230103
Implantable Lead Implant Date: 20230103
Implantable Lead Implant Date: 20230103
Implantable Lead Location: 753858
Implantable Lead Location: 753859
Implantable Lead Location: 753860
Implantable Lead Model: 138
Implantable Lead Model: 3830
Implantable Lead Model: 7841
Implantable Lead Serial Number: 1150746
Implantable Lead Serial Number: 302782
Implantable Pulse Generator Implant Date: 20230103
Lead Channel Impedance Value: 457 Ohm
Lead Channel Impedance Value: 564 Ohm
Lead Channel Impedance Value: 707 Ohm
Lead Channel Pacing Threshold Amplitude: 1.1 V
Lead Channel Pacing Threshold Pulse Width: 0.4 ms
Lead Channel Setting Pacing Amplitude: 2 V
Lead Channel Setting Pacing Amplitude: 2.5 V
Lead Channel Setting Pacing Amplitude: 2.5 V
Lead Channel Setting Pacing Pulse Width: 0.4 ms
Lead Channel Setting Pacing Pulse Width: 0.7 ms
Lead Channel Setting Sensing Sensitivity: 0.6 mV
Lead Channel Setting Sensing Sensitivity: 1 mV
Pulse Gen Serial Number: 151244
Zone Setting Status: 755011

## 2024-08-02 NOTE — Progress Notes (Signed)
 Remote ICD Transmission

## 2024-10-29 ENCOUNTER — Ambulatory Visit (INDEPENDENT_AMBULATORY_CARE_PROVIDER_SITE_OTHER): Payer: Medicare Other

## 2024-10-29 DIAGNOSIS — Z95810 Presence of automatic (implantable) cardiac defibrillator: Secondary | ICD-10-CM | POA: Diagnosis not present

## 2024-10-29 DIAGNOSIS — I255 Ischemic cardiomyopathy: Secondary | ICD-10-CM

## 2024-10-30 ENCOUNTER — Ambulatory Visit: Payer: Self-pay | Admitting: Student in an Organized Health Care Education/Training Program

## 2024-10-30 LAB — CUP PACEART REMOTE DEVICE CHECK
Battery Remaining Longevity: 78 mo
Battery Remaining Percentage: 90 %
Brady Statistic RA Percent Paced: 0 %
Brady Statistic RV Percent Paced: 99 %
Date Time Interrogation Session: 20260112051900
HighPow Impedance: 63 Ohm
Implantable Lead Connection Status: 753985
Implantable Lead Connection Status: 753985
Implantable Lead Connection Status: 753985
Implantable Lead Implant Date: 20230103
Implantable Lead Implant Date: 20230103
Implantable Lead Implant Date: 20230103
Implantable Lead Location: 753858
Implantable Lead Location: 753859
Implantable Lead Location: 753860
Implantable Lead Model: 138
Implantable Lead Model: 3830
Implantable Lead Model: 7841
Implantable Lead Serial Number: 1150746
Implantable Lead Serial Number: 302782
Implantable Pulse Generator Implant Date: 20230103
Lead Channel Impedance Value: 483 Ohm
Lead Channel Impedance Value: 578 Ohm
Lead Channel Impedance Value: 670 Ohm
Lead Channel Pacing Threshold Amplitude: 1 V
Lead Channel Pacing Threshold Pulse Width: 0.4 ms
Lead Channel Setting Pacing Amplitude: 2 V
Lead Channel Setting Pacing Amplitude: 2.5 V
Lead Channel Setting Pacing Amplitude: 2.5 V
Lead Channel Setting Pacing Pulse Width: 0.4 ms
Lead Channel Setting Pacing Pulse Width: 0.7 ms
Lead Channel Setting Sensing Sensitivity: 0.6 mV
Lead Channel Setting Sensing Sensitivity: 1 mV
Pulse Gen Serial Number: 151244
Zone Setting Status: 755011

## 2024-11-02 NOTE — Progress Notes (Signed)
 Remote ICD Transmission

## 2024-11-07 ENCOUNTER — Encounter: Payer: Self-pay | Admitting: Cardiology

## 2024-11-09 NOTE — Telephone Encounter (Signed)
 Recommend avoiding NSAIDs given heart/kidney disease

## 2024-11-13 NOTE — Progress Notes (Unsigned)
 " Cardiology Office Note:    Date:  11/14/2024   ID:  Keith Schwartz, DOB [birth year], MRN [id number]  PCP:  Onetha Particia SAILOR, MD  Cardiologist:  Lonni LITTIE Nanas, MD  Electrophysiologist:  Danelle Birmingham, MD   Referring MD: Onetha Particia SAILOR, MD   Chief Complaint  Patient presents with   Congestive Heart Failure     History of Present Illness:    Keith Schwartz is a 74 y.o. male with a hx of CAD, chronic combined systolic and also heart failure, T2DM, hypertension who presents for follow-up.  He was referred by Dr. Duanne for evaluation of syncope, initially seen on 11/12/2020.    Echocardiogram on 12/09/2020 showed EF 30%, normal RV function.  Lexiscan  Myoview  on 12/09/2020 showed large severe fixed defect involving inferior and anterior walls with preservation of lateral wall and LVEF 24%, suggesting extensive infarct.  Cardiac catheterization on 12/22/2020 showed diffusely diseased LAD with positive RFR, nonobstructive LCx stenosis, mild diffuse RCA disease, normal right heart pressures.  Medical therapy recommended.  Zio patch x14 days on 12/15/2020 showed 1 episode of NSVT lasting 4 beats, 3 episodes of SVT longest lasting 10 beats.  Echocardiogram 05/19/21 shows LVEF 30 to 35%, dyssynchrony consistent with LBBB, normal RV function, no significant valvular disease.  Cardiac MRI on 07/13/2021 showed LV EF 34%, RVEF 52%, RV insertion site LGE.  BiV ICD placed by Dr. Birmingham on 10/20/2021.  Echo 03/10/22 showed improvement in LVEF to 45-50%.  Echo 09/22/2023 showed EF 50 to 55%.   Since last clinic visit, he reports he is doing okay.  States that he will have chest discomfort that lasts for few seconds, describes minor pressure in chest.  Has not had to use any nitroglycerin .  Does report some lower extremity swelling. Denies any dyspnea, lightheadedness, syncope, or palpitations.    Wt Readings from Last 3 Encounters:  11/14/24 242 lb 9.6 oz (110 kg)  11/15/23 239 lb (108.4 kg)   06/23/23 231 lb 6.4 oz (105 kg)     Past Medical History:  Diagnosis Date   AICD (automatic cardioverter/defibrillator) present    Arthritis    CHF (congestive heart failure) (HCC)    Coronary artery disease    Diabetes mellitus without complication (HCC) 10/18/2000   History of kidney stones    Hypertension 10/18/2000   ICD (implantable cardioverter-defibrillator) in place    Ischemic cardiomyopathy    Myocardial infarction [facility])    Sleep apnea    uses cpap at times    Past Surgical History:  Procedure Laterality Date   APPENDECTOMY  10/18/1968   BIV ICD INSERTION CRT-D N/A 10/20/2021   Procedure: BIV ICD INSERTION CRT-D;  Surgeon: Birmingham Danelle LELON, MD;  Location: [facility] INVASIVE CV LAB;  Service: Cardiovascular;  Laterality: N/A;   CORONARY PRESSURE/FFR STUDY N/A 12/22/2020   Procedure: INTRAVASCULAR PRESSURE WIRE/FFR STUDY;  Surgeon: Wonda Sharper, MD;  Location: [facility] INVASIVE CV LAB;  Service: Cardiovascular;  Laterality: N/A;   CYSTOSCOPY W/ STONE MANIPULATION Left 1993   HERNIA REPAIR Bilateral 1976   groin and 1979   JOINT REPLACEMENT  10/18/2010   rt knee   NASAL SEPTUM SURGERY  1984   RIGHT/LEFT HEART CATH AND CORONARY ANGIOGRAPHY N/A 12/22/2020   Procedure: RIGHT/LEFT HEART CATH AND CORONARY ANGIOGRAPHY;  Surgeon: Wonda Sharper, MD;  Location: [facility] INVASIVE CV LAB;  Service: Cardiovascular;  Laterality: N/A;   thumb surgery Right 2019   TOTAL KNEE ARTHROPLASTY Left 05/10/2023   Procedure: TOTAL KNEE ARTHROPLASTY;  Surgeon: Ernie Cough, MD;  Location: [facility] ORS;  Service: Orthopedics;  Laterality: Left;   TOTAL KNEE REVISION Right 03/16/2022   Procedure: TOTAL KNEE REVISION;  Surgeon: Ernie Cough, MD;  Location: [facility] ORS;  Service: Orthopedics;  Laterality: Right;   VASECTOMY  10/18/1985    Current Medications: Current Meds  Medication Sig   Empagliflozin -metFORMIN  HCl ER 12.02-999 MG TB24 Take 1 tablet by mouth in the morning and at bedtime.   ezetimibe (ZETIA) 10  MG tablet Take 10 mg by mouth at bedtime.   traZODone (DESYREL) 50 MG tablet Take 50 mg by mouth at bedtime as needed. for sleep     Allergies:   Glipizide and Penicillins   Social History   Socioeconomic History   Marital status: Married    Spouse name: Not on file   Number of children: Not on file   Years of education: Not on file   Highest education level: Not on file  Occupational History   Not on file  Tobacco Use   Smoking status: Never   Smokeless tobacco: Never  Vaping Use   Vaping status: Never Used  Substance and Sexual Activity   Alcohol use: Not Currently    Alcohol/week: 1.0 standard drink of alcohol    Types: 1 Cans of beer per week   Drug use: Never   Sexual activity: Not Currently    Partners: Female    Birth control/protection: Surgical    Comment: MARRIED  Other Topics Concern   Not on file  Social History Narrative   Lives in [place] with spouse.   Retired visual merchandiser.   Former chief technology officer, engineer, site, printmaker    Social Drivers of Health   Tobacco Use: Low Risk (11/14/2024)   Patient History    Smoking Tobacco Use: Never    Smokeless Tobacco Use: Never    Passive Exposure: Not on file  Financial Resource Strain: Not on file  Food Insecurity: No Food Insecurity (05/10/2023)   Hunger Vital Sign    Worried About Running Out of Food in the Last Year: Never true    Ran Out of Food in the Last Year: Never true  Transportation Needs: No Transportation Needs (05/10/2023)   PRAPARE - Administrator, Civil Service (Medical): No    Lack of Transportation (Non-Medical): No  Physical Activity: Not on file  Stress: Not on file  Social Connections: Not on file  Depression (PHQ2-9): Low Risk (02/12/2022)   Depression (PHQ2-9)    PHQ-2 Score: 0  Alcohol Screen: Not on file  Housing: Low Risk (05/10/2023)   Housing    Last Housing Risk Score: 0  Utilities: Not At Risk (05/10/2023)   AHC Utilities    Threatened with loss of utilities: No   Health Literacy: Not on file     Family History: The patient's family history includes Arthritis in his mother; Asthma in his mother; Early death in his sister; Hearing loss in his mother; Heart disease in his father; Hyperlipidemia in his father; Hypertension in his father; Vision loss in his mother.  ROS:   Please see the history of present illness. All other systems reviewed and are negative.  EKGs/Labs/Other Studies Reviewed:    The following studies were reviewed today:  EKG:   12/30/2020: normal sinus rhythm, rate 80, left bundle branch block 06/23/23: BiV paced 11/14/2024: Atrial sensed, BiV paced, rate 76   Recent Labs: 11/21/2023: BUN 22; Creatinine, Ser 1.25; Magnesium  2.1; Potassium 5.1; Sodium 140  Recent  Lipid Panel    Component Value Date/Time   CHOL 102 11/21/2023 1129   TRIG 63 11/21/2023 1129   HDL 37 (L) 11/21/2023 1129   CHOLHDL 2.8 11/21/2023 1129   LDLCALC 51 11/21/2023 1129    Physical Exam:    VS:  BP 115/63 (BP Location: Left Arm, Patient Position: Sitting, Cuff Size: Normal)   Pulse 76   Ht 6' 4 (1.93 m)   Wt 242 lb 9.6 oz (110 kg)   SpO2 97%   BMI 29.53 kg/m     Wt Readings from Last 3 Encounters:  11/14/24 242 lb 9.6 oz (110 kg)  11/15/23 239 lb (108.4 kg)  06/23/23 231 lb 6.4 oz (105 kg)     GEN: Well nourished, well developed in no acute distress HEENT: Normal NECK: No JVD CARDIAC:RRR, 2 out of 6 systolic murmur RESPIRATORY:  Clear to auscultation without rales, wheezing or rhonchi  ABDOMEN: Soft, non-tender, non-distended MUSCULOSKELETAL:  Trace edema SKIN: Warm and dry NEUROLOGIC:  Alert and oriented x 3 PSYCHIATRIC:  Normal affect   ASSESSMENT:    1. Heart failure with recovered ejection fraction (HFrecEF) (HCC)   2. Coronary artery disease of native artery of native heart with stable angina pectoris   3. Hyperlipidemia, unspecified hyperlipidemia type   4. Essential hypertension      PLAN:     CAD: Reported chest  pain concerning for angina and EKG with left bundle branch block.  Echocardiogram on 12/09/2020 showed EF 30%, normal RV function.  Lexiscan  Myoview  on 12/09/2020 showed large severe fixed defect involving inferior and anterior walls with preservation of lateral wall and LVEF 24%, suggesting extensive infarct.  Cardiac catheterization on 12/22/2020 showed diffusely diseased LAD with positive RFR, nonobstructive LCx stenosis, mild diffuse RCA disease, normal right heart pressures.  Medical management recommended for obstructive LAD disease.  Reports occasional exertional chest pain, has not had to use nitroglycerin  -Continue aspirin  81 mg daily -Continue atorvastatin  80 mg daily.   -Continue carvedilol  12.5 mg twice daily -As needed sublingual nitroglycerin .   Heart failure with recovered EF: EF 30% on echocardiogram 12/09/2020.  Diffuse severe LAD disease as above, medical management recommended.  Echocardiogram 05/19/21 shows LVEF 30 to 35%, dyssynchrony consistent with LBBB.  Cardiac MRI on 07/13/2021 showed LV EF 34%, RVEF 52%, RV insertion site LGE.  EF remained <35% despite GDMT, and considering his LBBB with QRS , referred to EP and underwent BiV ICD placement on 10/20/2021. Echo 03/10/22 showed improvement in LVEF to 45-50%.  Echo 09/22/2023 showed EF 50 to 55%. -Continue Entresto  24-26 mg twice daily.  Developed hypotension/hyperkalemia on 49-51 mg BID -Continue carvedilol  12.5 mg twice daily - Reports he stopped taking Jardiance  -Continue spironolactone  12.5 mg daily -Recent device interrogation shows 99% BiV pacing.   - Update echocardiogram -BMET unremarkable 09/2024   Syncope: Occurred 10/16/2020.  Suspect due to hypotension, which may have been due to dehydration in setting of URI and continuing to take lisinopril .  SBP in 70s on EMS arrival.  No prodromal symptoms, arrhythmia remains on differential.  Echocardiogram on 12/09/2020 showed EF 30%, normal RV function.   Zio patch x14 days on  12/15/2020 showed 1 episode of NSVT lasting 4 beats, 3 episodes of SVT longest lasting 10 beats. -No recent episode   Hypertension: Continue Entresto , carvedilol , and spironolactone  as above.  Appears controlled   Hyperlipidemia: On atorvastatin  80 mg daily.  LDL 49 on 09/2024   T2DM: On metformin , glimepiride , Trulicity , Jardiance .  A1c 7.5% on  09/2024   CKD stage 3a: Creatinine 1.75 on 10/16/2020, improved to creatinine 1.30 on 09/2024.     Leg pain: ABIs indicate noncompressible vessels, normal TBI's on 01/08/2021.  Lower extremity duplex shows heavy calcifications, questionable distal occlusion in the left posterior tibial artery.  Obesity: on Ozempic     RTC in 6 months    Medication Adjustments/Labs and Tests Ordered: Current medicines are reviewed at length with the patient today.  Concerns regarding medicines are outlined above.  Orders Placed This Encounter  Procedures   EKG 12-Lead   ECHOCARDIOGRAM COMPLETE    Meds ordered this encounter  Medications   nitroGLYCERIN  (NITROSTAT ) 0.4 MG SL tablet    Sig: Place 1 tablet (0.4 mg total) under the tongue every 5 (five) minutes as needed for chest pain.    Dispense:  25 tablet    Refill:  3     Patient Instructions  Medication Instructions:  Your physician recommends that you continue on your current medications as directed. Please refer to the Current Medication list given to you today.  *If you need a refill on your cardiac medications before your next appointment, please call your pharmacy*  Lab Work: NONE If you have labs (blood work) drawn today and your tests are completely normal, you will receive your results only by: MyChart Message (if you have MyChart) OR A paper copy in the mail If you have any lab test that is abnormal or we need to change your treatment, we will call you to review the results.  Testing/Procedures: ECHO Your physician has requested that you have an echocardiogram. Echocardiography is a  painless test that uses sound waves to create images of your heart. It provides your doctor with information about the size and shape of your heart and how well your hearts chambers and valves are working. This procedure takes approximately one hour. There are no restrictions for this procedure. Please do NOT wear cologne, perfume, aftershave, or lotions (deodorant is allowed). Please arrive 15 minutes prior to your appointment time.  Please note: We ask at that you not bring children with you during ultrasound (echo/ vascular) testing. Due to room size and safety concerns, children are not allowed in the ultrasound rooms during exams. Our front office staff cannot provide observation of children in our lobby area while testing is being conducted. An adult accompanying a patient to their appointment will only be allowed in the ultrasound room at the discretion of the ultrasound technician under special circumstances. We apologize for any inconvenience.   Follow-Up: At [facility], you and your health needs are our priority.  As part of our continuing mission to provide you with exceptional heart care, our providers are all part of one team.  This team includes your primary Cardiologist (physician) and Advanced Practice Providers or APPs (Physician Assistants and Nurse Practitioners) who all work together to provide you with the care you need, when you need it.  Your next appointment:   6 MONTHS  Provider:   Dr. Kate  We recommend signing up for the patient portal called MyChart.  Sign up information is provided on this After Visit Summary.  MyChart is used to connect with patients for Virtual Visits (Telemedicine).  Patients are able to view lab/test results, encounter notes, upcoming appointments, etc.  Non-urgent messages can be sent to your provider as well.   To learn more about what you can do with MyChart, go to [email or web address].   Other Instructions none  Signed, Lonni LITTIE Nanas, MD  11/14/2024 11:25 AM    [facility] "

## 2024-11-14 ENCOUNTER — Ambulatory Visit: Attending: Cardiology | Admitting: Cardiology

## 2024-11-14 ENCOUNTER — Encounter: Payer: Self-pay | Admitting: Cardiology

## 2024-11-14 VITALS — BP 115/63 | HR 76 | Ht 76.0 in | Wt 242.6 lb

## 2024-11-14 DIAGNOSIS — E785 Hyperlipidemia, unspecified: Secondary | ICD-10-CM | POA: Diagnosis not present

## 2024-11-14 DIAGNOSIS — I25118 Atherosclerotic heart disease of native coronary artery with other forms of angina pectoris: Secondary | ICD-10-CM | POA: Diagnosis not present

## 2024-11-14 DIAGNOSIS — I10 Essential (primary) hypertension: Secondary | ICD-10-CM

## 2024-11-14 DIAGNOSIS — I5020 Unspecified systolic (congestive) heart failure: Secondary | ICD-10-CM | POA: Diagnosis not present

## 2024-11-14 MED ORDER — NITROGLYCERIN 0.4 MG SL SUBL: 0.4000 mg | SUBLINGUAL_TABLET | SUBLINGUAL | 3 refills | Status: AC | PRN

## 2024-11-14 NOTE — Patient Instructions (Signed)
 Medication Instructions:  Your physician recommends that you continue on your current medications as directed. Please refer to the Current Medication list given to you today.  *If you need a refill on your cardiac medications before your next appointment, please call your pharmacy*  Lab Work: NONE If you have labs (blood work) drawn today and your tests are completely normal, you will receive your results only by: MyChart Message (if you have MyChart) OR A paper copy in the mail If you have any lab test that is abnormal or we need to change your treatment, we will call you to review the results.  Testing/Procedures: ECHO Your physician has requested that you have an echocardiogram. Echocardiography is a painless test that uses sound waves to create images of your heart. It provides your doctor with information about the size and shape of your heart and how well your hearts chambers and valves are working. This procedure takes approximately one hour. There are no restrictions for this procedure. Please do NOT wear cologne, perfume, aftershave, or lotions (deodorant is allowed). Please arrive 15 minutes prior to your appointment time.  Please note: We ask at that you not bring children with you during ultrasound (echo/ vascular) testing. Due to room size and safety concerns, children are not allowed in the ultrasound rooms during exams. Our front office staff cannot provide observation of children in our lobby area while testing is being conducted. An adult accompanying a patient to their appointment will only be allowed in the ultrasound room at the discretion of the ultrasound technician under special circumstances. We apologize for any inconvenience.   Follow-Up: At [facility], you and your health needs are our priority.  As part of our continuing mission to provide you with exceptional heart care, our providers are all part of one team.  This team includes your primary Cardiologist  (physician) and Advanced Practice Providers or APPs (Physician Assistants and Nurse Practitioners) who all work together to provide you with the care you need, when you need it.  Your next appointment:   6 MONTHS  Provider:   Dr. Kate  We recommend signing up for the patient portal called MyChart.  Sign up information is provided on this After Visit Summary.  MyChart is used to connect with patients for Virtual Visits (Telemedicine).  Patients are able to view lab/test results, encounter notes, upcoming appointments, etc.  Non-urgent messages can be sent to your provider as well.   To learn more about what you can do with MyChart, go to [email or web address].   Other Instructions none

## 2024-12-31 ENCOUNTER — Ambulatory Visit (HOSPITAL_COMMUNITY)

## 2025-01-28 ENCOUNTER — Encounter

## 2025-04-29 ENCOUNTER — Encounter

## 2025-07-29 ENCOUNTER — Encounter

## 2025-10-28 ENCOUNTER — Encounter

## 2026-01-27 ENCOUNTER — Encounter
